# Patient Record
Sex: Female | Born: 1950 | Race: Black or African American | Hispanic: No | Smoking: Never smoker
Health system: Southern US, Community
[De-identification: ages and names within clinical notes are randomized; demographics above are authoritative.]

## PROBLEM LIST (undated history)

## (undated) DIAGNOSIS — E785 Hyperlipidemia, unspecified: Secondary | ICD-10-CM

## (undated) DIAGNOSIS — Z87440 Personal history of urinary (tract) infections: Secondary | ICD-10-CM

## (undated) DIAGNOSIS — Z8619 Personal history of other infectious and parasitic diseases: Secondary | ICD-10-CM

## (undated) DIAGNOSIS — A8030 Acute paralytic poliomyelitis, unspecified: Secondary | ICD-10-CM

## (undated) DIAGNOSIS — I10 Essential (primary) hypertension: Secondary | ICD-10-CM

## (undated) DIAGNOSIS — N950 Postmenopausal bleeding: Secondary | ICD-10-CM

## (undated) DIAGNOSIS — F329 Major depressive disorder, single episode, unspecified: Secondary | ICD-10-CM

## (undated) DIAGNOSIS — Z9889 Other specified postprocedural states: Secondary | ICD-10-CM

## (undated) DIAGNOSIS — A809 Acute poliomyelitis, unspecified: Secondary | ICD-10-CM

## (undated) DIAGNOSIS — H18519 Endothelial corneal dystrophy, unspecified eye: Secondary | ICD-10-CM

## (undated) DIAGNOSIS — H1851 Endothelial corneal dystrophy: Secondary | ICD-10-CM

## (undated) DIAGNOSIS — E119 Type 2 diabetes mellitus without complications: Secondary | ICD-10-CM

## (undated) HISTORY — PX: TUBAL LIGATION: SHX77

## (undated) HISTORY — PX: COLONOSCOPY: SHX174

## (undated) HISTORY — DX: Personal history of other infectious and parasitic diseases: Z86.19

## (undated) HISTORY — DX: Personal history of urinary (tract) infections: Z87.440

## (undated) HISTORY — DX: Other specified postprocedural states: Z98.890

## (undated) HISTORY — DX: Essential (primary) hypertension: I10

## (undated) HISTORY — PX: BUNIONECTOMY WITH HAMMERTOE RECONSTRUCTION: SHX5600

## (undated) HISTORY — DX: Acute poliomyelitis, unspecified: A80.9

## (undated) HISTORY — PX: CATARACT EXTRACTION W/ INTRAOCULAR LENS  IMPLANT, BILATERAL: SHX1307

## (undated) HISTORY — DX: Acute paralytic poliomyelitis, unspecified: A80.30

## (undated) HISTORY — DX: Hyperlipidemia, unspecified: E78.5

---

## 2000-01-15 ENCOUNTER — Encounter: Payer: Self-pay | Admitting: Emergency Medicine

## 2000-01-15 ENCOUNTER — Emergency Department (HOSPITAL_COMMUNITY): Admission: EM | Admit: 2000-01-15 | Discharge: 2000-01-15 | Payer: Self-pay | Admitting: Emergency Medicine

## 2000-05-26 ENCOUNTER — Inpatient Hospital Stay (HOSPITAL_COMMUNITY): Admission: EM | Admit: 2000-05-26 | Discharge: 2000-05-31 | Payer: Self-pay | Admitting: *Deleted

## 2000-06-02 ENCOUNTER — Other Ambulatory Visit (HOSPITAL_COMMUNITY): Admission: RE | Admit: 2000-06-02 | Discharge: 2000-06-13 | Payer: Self-pay | Admitting: Psychiatry

## 2004-04-10 ENCOUNTER — Ambulatory Visit: Payer: Self-pay | Admitting: Internal Medicine

## 2004-04-23 ENCOUNTER — Ambulatory Visit: Payer: Self-pay | Admitting: Internal Medicine

## 2004-06-18 ENCOUNTER — Other Ambulatory Visit: Admission: RE | Admit: 2004-06-18 | Discharge: 2004-06-18 | Payer: Self-pay | Admitting: Internal Medicine

## 2004-06-18 ENCOUNTER — Ambulatory Visit: Payer: Self-pay | Admitting: Internal Medicine

## 2005-01-03 ENCOUNTER — Ambulatory Visit: Payer: Self-pay | Admitting: Internal Medicine

## 2005-01-11 ENCOUNTER — Ambulatory Visit: Payer: Self-pay | Admitting: Internal Medicine

## 2005-01-14 HISTORY — PX: BREAST BIOPSY: SHX20

## 2005-01-25 ENCOUNTER — Encounter: Admission: RE | Admit: 2005-01-25 | Discharge: 2005-01-25 | Payer: Self-pay | Admitting: Internal Medicine

## 2005-02-01 ENCOUNTER — Ambulatory Visit: Payer: Self-pay | Admitting: Internal Medicine

## 2005-02-18 ENCOUNTER — Encounter: Admission: RE | Admit: 2005-02-18 | Discharge: 2005-02-18 | Payer: Self-pay | Admitting: Internal Medicine

## 2005-02-20 ENCOUNTER — Encounter: Admission: RE | Admit: 2005-02-20 | Discharge: 2005-02-20 | Payer: Self-pay | Admitting: Internal Medicine

## 2005-02-20 ENCOUNTER — Encounter (INDEPENDENT_AMBULATORY_CARE_PROVIDER_SITE_OTHER): Payer: Self-pay | Admitting: Specialist

## 2005-09-24 ENCOUNTER — Encounter: Admission: RE | Admit: 2005-09-24 | Discharge: 2005-09-24 | Payer: Self-pay | Admitting: Internal Medicine

## 2006-02-06 ENCOUNTER — Encounter: Admission: RE | Admit: 2006-02-06 | Discharge: 2006-02-06 | Payer: Self-pay | Admitting: Internal Medicine

## 2006-06-02 ENCOUNTER — Ambulatory Visit: Payer: Self-pay | Admitting: Internal Medicine

## 2006-06-19 ENCOUNTER — Ambulatory Visit: Payer: Self-pay | Admitting: Internal Medicine

## 2006-07-03 ENCOUNTER — Ambulatory Visit: Payer: Self-pay | Admitting: Internal Medicine

## 2007-03-10 ENCOUNTER — Encounter: Payer: Self-pay | Admitting: *Deleted

## 2007-03-10 DIAGNOSIS — Z87898 Personal history of other specified conditions: Secondary | ICD-10-CM

## 2007-03-10 DIAGNOSIS — F329 Major depressive disorder, single episode, unspecified: Secondary | ICD-10-CM

## 2007-03-10 DIAGNOSIS — A809 Acute poliomyelitis, unspecified: Secondary | ICD-10-CM | POA: Insufficient documentation

## 2007-03-10 DIAGNOSIS — E785 Hyperlipidemia, unspecified: Secondary | ICD-10-CM

## 2007-03-10 DIAGNOSIS — I10 Essential (primary) hypertension: Secondary | ICD-10-CM

## 2007-03-10 DIAGNOSIS — B009 Herpesviral infection, unspecified: Secondary | ICD-10-CM | POA: Insufficient documentation

## 2007-03-10 DIAGNOSIS — Z87448 Personal history of other diseases of urinary system: Secondary | ICD-10-CM | POA: Insufficient documentation

## 2007-11-09 ENCOUNTER — Telehealth: Payer: Self-pay | Admitting: Internal Medicine

## 2007-11-25 ENCOUNTER — Other Ambulatory Visit: Admission: RE | Admit: 2007-11-25 | Discharge: 2007-11-25 | Payer: Self-pay | Admitting: Internal Medicine

## 2007-11-25 ENCOUNTER — Encounter: Admission: RE | Admit: 2007-11-25 | Discharge: 2007-11-25 | Payer: Self-pay | Admitting: Internal Medicine

## 2007-11-25 ENCOUNTER — Encounter: Payer: Self-pay | Admitting: Internal Medicine

## 2007-11-25 ENCOUNTER — Ambulatory Visit: Payer: Self-pay | Admitting: Internal Medicine

## 2007-11-25 LAB — CONVERTED CEMR LAB
ALT: 19 units/L (ref 0–35)
AST: 27 units/L (ref 0–37)
Albumin: 3.9 g/dL (ref 3.5–5.2)
Alkaline Phosphatase: 110 units/L (ref 39–117)
CO2: 30 meq/L (ref 19–32)
Chloride: 102 meq/L (ref 96–112)
Cholesterol: 139 mg/dL (ref 0–200)
LDL Cholesterol: 77 mg/dL (ref 0–99)
Potassium: 3 meq/L — ABNORMAL LOW (ref 3.5–5.1)
TSH: 0.73 microintl units/mL (ref 0.35–5.50)
Total Bilirubin: 0.7 mg/dL (ref 0.3–1.2)
Total CHOL/HDL Ratio: 3.5
Total Protein: 7.6 g/dL (ref 6.0–8.3)
VLDL: 21 mg/dL (ref 0–40)

## 2007-11-29 ENCOUNTER — Encounter: Payer: Self-pay | Admitting: Internal Medicine

## 2008-09-22 ENCOUNTER — Telehealth (INDEPENDENT_AMBULATORY_CARE_PROVIDER_SITE_OTHER): Payer: Self-pay | Admitting: *Deleted

## 2008-11-15 ENCOUNTER — Ambulatory Visit: Payer: Self-pay | Admitting: Internal Medicine

## 2008-11-15 LAB — CONVERTED CEMR LAB
Albumin: 4.1 g/dL (ref 3.5–5.2)
Basophils Relative: 0.3 % (ref 0.0–3.0)
Cholesterol: 120 mg/dL (ref 0–200)
Creatinine, Ser: 0.7 mg/dL (ref 0.4–1.2)
Eosinophils Absolute: 0.2 10*3/uL (ref 0.0–0.7)
Hemoglobin: 13.7 g/dL (ref 12.0–15.0)
Ketones, ur: NEGATIVE mg/dL
Leukocytes, UA: NEGATIVE
Lymphs Abs: 1.7 10*3/uL (ref 0.7–4.0)
MCHC: 33.7 g/dL (ref 30.0–36.0)
MCV: 87.5 fL (ref 78.0–100.0)
Monocytes Absolute: 0.4 10*3/uL (ref 0.1–1.0)
Neutro Abs: 4.1 10*3/uL (ref 1.4–7.7)
Potassium: 3.1 meq/L — ABNORMAL LOW (ref 3.5–5.1)
RBC: 4.65 M/uL (ref 3.87–5.11)
RDW: 13.6 % (ref 11.5–14.6)
Specific Gravity, Urine: 1.015 (ref 1.000–1.030)
TSH: 1.17 microintl units/mL (ref 0.35–5.50)
Total CHOL/HDL Ratio: 3
Total Protein, Urine: NEGATIVE mg/dL
Total Protein: 7.3 g/dL (ref 6.0–8.3)
Triglycerides: 140 mg/dL (ref 0.0–149.0)
Urine Glucose: NEGATIVE mg/dL
pH: 6 (ref 5.0–8.0)

## 2008-11-25 ENCOUNTER — Ambulatory Visit: Payer: Self-pay | Admitting: Internal Medicine

## 2008-11-25 ENCOUNTER — Encounter: Admission: RE | Admit: 2008-11-25 | Discharge: 2008-11-25 | Payer: Self-pay | Admitting: Internal Medicine

## 2008-11-25 DIAGNOSIS — E118 Type 2 diabetes mellitus with unspecified complications: Secondary | ICD-10-CM | POA: Insufficient documentation

## 2008-11-25 DIAGNOSIS — E119 Type 2 diabetes mellitus without complications: Secondary | ICD-10-CM

## 2008-11-30 ENCOUNTER — Ambulatory Visit: Payer: Self-pay | Admitting: Internal Medicine

## 2008-12-15 ENCOUNTER — Telehealth: Payer: Self-pay | Admitting: Internal Medicine

## 2008-12-29 ENCOUNTER — Encounter: Payer: Self-pay | Admitting: Internal Medicine

## 2009-03-17 ENCOUNTER — Ambulatory Visit: Payer: Self-pay | Admitting: Internal Medicine

## 2009-03-17 LAB — CONVERTED CEMR LAB: Hgb A1c MFr Bld: 6.7 % — ABNORMAL HIGH (ref 4.6–6.5)

## 2009-03-28 ENCOUNTER — Telehealth: Payer: Self-pay | Admitting: Internal Medicine

## 2009-11-27 ENCOUNTER — Ambulatory Visit: Payer: Self-pay | Admitting: Internal Medicine

## 2009-11-27 LAB — CONVERTED CEMR LAB
BUN: 11 mg/dL (ref 6–23)
Basophils Absolute: 0 10*3/uL (ref 0.0–0.1)
Cholesterol: 132 mg/dL (ref 0–200)
Eosinophils Absolute: 0.1 10*3/uL (ref 0.0–0.7)
GFR calc non Af Amer: 91.27 mL/min (ref >60)
Glucose, Bld: 110 mg/dL — ABNORMAL HIGH (ref 70–99)
HCT: 43.3 % (ref 36.0–46.0)
Leukocytes, UA: NEGATIVE
Lymphs Abs: 2 10*3/uL (ref 0.7–4.0)
MCV: 86.8 fL (ref 78.0–100.0)
Monocytes Absolute: 0.4 10*3/uL (ref 0.1–1.0)
Monocytes Relative: 4.8 % (ref 3.0–12.0)
Nitrite: NEGATIVE
Platelets: 300 10*3/uL (ref 150.0–400.0)
Potassium: 3.3 meq/L — ABNORMAL LOW (ref 3.5–5.1)
RDW: 14.3 % (ref 11.5–14.6)
Specific Gravity, Urine: 1.01 (ref 1.000–1.030)
TSH: 2.35 microintl units/mL (ref 0.35–5.50)
Total Bilirubin: 0.6 mg/dL (ref 0.3–1.2)
VLDL: 10 mg/dL (ref 0.0–40.0)
pH: 5.5 (ref 5.0–8.0)

## 2009-12-01 ENCOUNTER — Encounter: Payer: Self-pay | Admitting: Internal Medicine

## 2009-12-01 ENCOUNTER — Encounter: Admission: RE | Admit: 2009-12-01 | Discharge: 2009-12-01 | Payer: Self-pay | Admitting: Internal Medicine

## 2009-12-01 ENCOUNTER — Ambulatory Visit: Payer: Self-pay | Admitting: Internal Medicine

## 2009-12-01 ENCOUNTER — Other Ambulatory Visit: Admission: RE | Admit: 2009-12-01 | Discharge: 2009-12-01 | Payer: Self-pay | Admitting: Internal Medicine

## 2009-12-01 LAB — CONVERTED CEMR LAB
HDL goal, serum: 40 mg/dL
LDL Goal: 100 mg/dL

## 2009-12-09 ENCOUNTER — Encounter: Payer: Self-pay | Admitting: Internal Medicine

## 2010-02-04 ENCOUNTER — Encounter: Payer: Self-pay | Admitting: Internal Medicine

## 2010-02-13 NOTE — Assessment & Plan Note (Signed)
Summary: PHYSICAL  STC   Vital Signs:  Patient profile:   60 year old female Height:      66 inches Weight:      177 pounds BMI:     28.67 O2 Sat:      97 % on Room air Temp:     98.3 degrees F oral Pulse rate:   63 / minute BP sitting:   124 / 82  (left arm) Cuff size:   large  Vitals Entered By: Bill Salinas CMA (December 01, 2009 1:53 PM)  O2 Flow:  Room air CC: cpx, Hypertension Management, Lipid Management Comments pt states she is no longer taking pristiq   Primary Care Provider:  Jacques Navy MD  CC:  cpx, Hypertension Management, and Lipid Management.  History of Present Illness: Patient presents for an annual well woman exam. She is feeling well and has no complaints. She is worried about breast cancer: mother and older sister have both had breast cancer and are BRCA positive. She has not been tested.  Younger sister also had breast but was BRCA negative.  Hypertension History:      She denies headache, chest pain, palpitations, dyspnea with exertion, orthopnea, PND, neurologic problems, and side effects from treatment.  She notes no problems with any antihypertensive medication side effects.        Positive major cardiovascular risk factors include female age 99 years old or older, diabetes, hyperlipidemia, and hypertension.  Negative major cardiovascular risk factors include negative family history for ischemic heart disease and non-tobacco-user status.        Further assessment for target organ damage reveals no history of ASHD, stroke/TIA, or peripheral vascular disease.    Lipid Management History:      Positive NCEP/ATP III risk factors include female age 69 years old or older, diabetes, and hypertension.  Negative NCEP/ATP III risk factors include no family history for ischemic heart disease, non-tobacco-user status, no ASHD (atherosclerotic heart disease), no prior stroke/TIA, no peripheral vascular disease, and no history of aortic aneurysm.      Current  Medications (verified): 1)  Norvasc 10 Mg  Tabs (Amlodipine Besylate) .... Take One Tablet Once Daily 2)  Ferrous Sulfate 325 (65 Fe) Mg  Tabs (Ferrous Sulfate) .... Take 1 Tablet By Mouth Once A Day 3)  Hydrochlorothiazide 12.5 Mg  Tabs (Hydrochlorothiazide) .... Take One Tablet Once Daily 4)  Vytorin 10-20 Mg  Tabs (Ezetimibe-Simvastatin) .... Take One Tablet At Bedtime 5)  Klor-Con 10 10 Meq Cr-Tabs (Potassium Chloride) .... Take 1 Tablet By Mouth Once A Day 6)  Vitamin C Cr 500 Mg Cr-Caps (Ascorbic Acid) .Marland Kitchen.. 1 Cap Daily 7)  Flax Seed Oil 1000 Mg Caps (Flaxseed (Linseed)) .Marland Kitchen.. 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 One is BRCA +  Physical Exam  General:  alert, well-developed, well-nourished, well-hydrated, and healthy-appearing AA woman.   Head:  Normocephalic and atraumatic without obvious abnormalities. No apparent alopecia or balding. Eyes:  No corneal or conjunctival inflammation noted. EOMI. Perrla. Funduscopic exam benign, without hemorrhages, exudates or papilledema. Vision grossly normal. Ears:   External ear exam shows no significant lesions or deformities.  Otoscopic examination reveals clear canals, tympanic membranes are intact bilaterally without bulging, retraction, inflammation or discharge. Hearing is grossly normal bilaterally. Nose:  no external deformity and no external erythema.   Mouth:  Oral mucosa and oropharynx without lesions or exudates.  Teeth in good repair. Neck:  supple, full ROM, no thyromegaly, and no HJR.   Chest Wall:  No deformities, masses, or tenderness noted. Breasts:  No mass, nodules, thickening, tenderness, bulging, retraction, inflamation, nipple discharge or skin changes noted.   Lungs:  Normal respiratory effort, chest expands symmetrically. Lungs are clear to auscultation, no crackles or wheezes. Heart:  Normal rate and regular rhythm. S1 and S2 normal without gallop, murmur, click, rub or other extra sounds. Abdomen:  soft, non-tender, normal bowel sounds, no masses, no guarding, and no hepatomegaly.   Genitalia:  Pelvic Exam:        External: normal female genitalia without lesions or masses        Vagina: normal without lesions or masses        Cervix: normal without lesions or masses        Adnexa: normal bimanual exam without masses or fullness        Uterus: normal by palpation        Pap smear: performed Msk:  normal ROM, no joint tenderness, no joint swelling, no joint warmth, and no redness over joints.   Pulses:  2+ radial, 1+ DP pulses Extremities:  No clubbing, cyanosis, edema, or deformity noted with normal full range of motion of all joints.   Neurologic:  alert & oriented X3, cranial nerves II-XII intact, strength normal in all extremities, sensation intact to light touch, sensation intact to pinprick, gait normal, and DTRs symmetrical and normal.   Skin:  turgor normal, color normal, no rashes, and no suspicious lesions.   Cervical Nodes:  No lymphadenopathy noted Axillary Nodes:  no R axillary adenopathy and no L axillary adenopathy.    Psych:  Oriented X3, memory intact for recent and remote, normally interactive, good eye contact, and not anxious appearing.     Impression & Recommendations:  Problem # 1:  DIABETES MELLITUS, TYPE II, CONTROLLED (ICD-250.00)  Labs Reviewed: Creat: 0.8 (11/27/2009)    Reviewed HgBA1c results: 6.7 (03/17/2009)  7.1 (11/25/2008)  Serum glucose is good. No A1C done with CPX labs.  Plan - continue life-style management   Problem # 2:  HYPERLIPIDEMIA (ICD-272.4)  Her updated medication list for this problem includes:    Vytorin 10-20 Mg Tabs (Ezetimibe-simvastatin) .Marland Kitchen... Take one tablet at bedtime  Labs Reviewed: SGOT: 30 (11/27/2009)   SGPT: 27 (11/27/2009)   HDL:55.80 (11/27/2009), 40.50 (11/15/2008)  LDL:66 (11/27/2009), 52 (11/15/2008)  Chol:132 (11/27/2009), 120 (11/15/2008)  Trig:50.0 (11/27/2009), 140.0 (11/15/2008)  Excellent control on current medications.  Problem # 3:  HYPERTENSION (ICD-401.9)  Her updated medication list for this problem includes:    Norvasc 10 Mg Tabs (Amlodipine besylate) .Marland Kitchen... Take one tablet once daily    Hydrochlorothiazide 12.5 Mg Tabs (Hydrochlorothiazide) .Marland Kitchen... Take one tablet once daily  BP today:  124/82 Prior BP: 124/80 (11/30/2008)  Labs Reviewed: K+: 3.3 (11/27/2009) Creat: : 0.8 (11/27/2009)   Very well controlled on present meds.   Orders: EKG w/ Interpretation (93000)  Problem # 4:  Preventive Health Care (ICD-V70.0) Unremarkable interval history. Physical exam is normal. lab results are excellent and in normal range. Breast health - normal manual exam, she hassuthad mammogram. Her family history is remarkable for mother and 2 sisters with breast cancer and mother and 1 sister BRCA +. She is encouraged to have BRCA testing for her own sake and that of her daughter. She is a candidate for colonsocopy. She is a candidate for up-dating immuniaztions: tetnus, pneumonia and shingles. 12 lead EKG without evidence of injury or  ischemia.  In summary - a very nice woman who appears to be medically stable. Counseled as to the issues above. She will return in 6 months for an interval follow-up.  Complete Medication List: 1)  Norvasc 10 Mg Tabs (Amlodipine besylate) .... Take one tablet once daily 2)  Ferrous Sulfate 325 (65 Fe) Mg Tabs (Ferrous sulfate) .... Take 1 tablet by mouth once a day 3)  Hydrochlorothiazide 12.5 Mg Tabs (Hydrochlorothiazide) .... Take one tablet once daily 4)  Vytorin 10-20 Mg Tabs (Ezetimibe-simvastatin) .... Take one tablet at bedtime 5)  Klor-con 10 10 Meq Cr-tabs (Potassium chloride) .... Take 1 tablet by mouth once a day 6)  Vitamin C Cr 500 Mg Cr-caps (Ascorbic acid) .Marland Kitchen.. 1 cap daily 7)  Flax Seed Oil 1000 Mg Caps (Flaxseed (linseed)) .Marland Kitchen.. 1 tab daily  Hypertension Assessment/Plan:      The patient's hypertensive risk group is category C: Target organ damage and/or diabetes.  Her calculated 10 year risk of coronary heart disease is 13 %.  Today's blood pressure is 124/82.    Lipid Assessment/Plan:      Based on NCEP/ATP III, the patient's risk factor category is "history of diabetes".  The patient's lipid goals are as follows: Total cholesterol goal is 200; LDL cholesterol goal is 100; HDL cholesterol goal is 40; Triglyceride goal is 150.  Her LDL cholesterol goal has been met.    Patient: Anna Cooper Note: All result statuses are Final unless otherwise noted.  Tests: (1) Lipid Panel (LIPID)   Cholesterol               132 mg/dL                   1-761     ATP III Classification            Desirable:  < 200 mg/dL                    Borderline High:  200 - 239 mg/dL               High:  > = 240 mg/dL   Triglycerides             50.0 mg/dL                  6.0-737.1     Normal:  <150 mg/dL     Borderline High:  062 - 199 mg/dL   HDL                       69.48 mg/dL                 >54.62   VLDL Cholesterol  10.0 mg/dL                  7.4-25.9   LDL Cholesterol            66 mg/dL                    5-63  CHO/HDL Ratio:  CHD Risk                             2                    Men          Women     1/2 Average Risk     3.4          3.3     Average Risk          5.0          4.4     2X Average Risk          9.6          7.1     3X Average Risk          15.0          11.0                           Tests: (2) BMP (METABOL)   Sodium                    136 mEq/L                   135-145   Potassium            [L]  3.3 mEq/L                   3.5-5.1   Chloride                  99 mEq/L                    96-112   Carbon Dioxide            29 mEq/L                    19-32   Glucose              [H]  110 mg/dL                   87-56   BUN                       11 mg/dL                    4-33   Creatinine                0.8 mg/dL                   2.9-5.1   Calcium                   9.2 mg/dL                   8.8-41.6   GFR  91.27 mL/min                >60  Tests: (3) CBC Platelet w/Diff (CBCD)   White Cell Count          8.0 K/uL                    4.5-10.5   Red Cell Count            4.99 Mil/uL                 3.87-5.11   Hemoglobin                14.3 g/dL                   16.1-09.6   Hematocrit                43.3 %                      36.0-46.0   MCV                       86.8 fl                     78.0-100.0   MCHC                      33.1 g/dL                   04.5-40.9   RDW                       14.3 %                      11.5-14.6   Platelet Count            300.0 K/uL                  150.0-400.0   Neutrophil %              68.7 %                      43.0-77.0   Lymphocyte %              25.0 %                      12.0-46.0   Monocyte %                4.8 %                       3.0-12.0   Eosinophils%              1.2 %                       0.0-5.0   Basophils %               0.3 %                       0.0-3.0   Neutrophill Absolute      5.5 K/uL                    1.4-7.7   Lymphocyte Absolute  2.0  K/uL                    0.7-4.0   Monocyte Absolute         0.4 K/uL                    0.1-1.0  Eosinophils, Absolute                             0.1 K/uL                    0.0-0.7   Basophils Absolute        0.0 K/uL                    0.0-0.1  Tests: (4) Hepatic/Liver Function Panel (HEPATIC)   Total Bilirubin           0.6 mg/dL                   1.6-1.0   Direct Bilirubin          0.1 mg/dL                   9.6-0.4   Alkaline Phosphatase      114 U/L                     39-117   AST                       30 U/L                      0-37   ALT                       27 U/L                      0-35   Total Protein             8.2 g/dL                    5.4-0.9   Albumin                   4.6 g/dL                    8.1-1.9  Tests: (5) TSH (TSH)   FastTSH                   2.35 uIU/mL                 0.35-5.50  Tests: (6) UDip Only (UDIP)   Color                     LT. YELLOW       RANGE:  Yellow;Lt. Yellow   Clarity                   CLEAR                       Clear   Specific Gravity          1.010                       1.000 - 1.030  Urine Ph                  5.5                         5.0-8.0   Protein                   NEGATIVE                    Negative   Urine Glucose             NEGATIVE                    Negative   Ketones                   NEGATIVE                    Negative   Urine Bilirubin           NEGATIVE                    Negative   Blood                     NEGATIVE                    Negative   Urobilinogen              0.2                         0.0 - 1.0   Leukocyte Esterace        NEGATIVE                    Negative    Nitrite                   NEGATIVE                    Negative  Orders Added: 1)  Est. Patient 40-64 years [99396] 2)  Est. Patient Level III [30865] 3)  EKG w/ Interpretation [93000]

## 2010-02-13 NOTE — Progress Notes (Signed)
  Phone Note Outgoing Call   Reason for Call: Discuss lab or test results Summary of Call: please call. A1C 6.7%  GOOD WORK. Continue present treatment and life-style.  Thanks MEN Initial call taken by: Jacques Navy MD,  March 28, 2009 4:29 PM  Follow-up for Phone Call        informed pt  Follow-up by: Ami Bullins CMA,  March 28, 2009 4:40 PM

## 2010-02-13 NOTE — Letter (Signed)
Summary: [facility]  [facility]   Imported By: Sherian Rein 04/03/2009 08:42:38  _____________________________________________________________________  External Attachment:    Type:   Image     Comment:   External Document

## 2010-02-13 NOTE — Letter (Signed)
    [facility] [address] [place], [place]  [address] Phone: [phone]      December 10, 2009   MARIAMAWIT DEPAOLI [address] [place], [place] [address]  RE:  LAB RESULTS  Dear  Ms. Gabbard,  The following is an interpretation of your most recent lab tests.  Please take note of any instructions provided or changes to medications that have resulted from your lab work.  Pap Smear: normal     HAPPY HOLIDAYS   Sincerely Yours,    Jacques Navy MD
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
## 2010-06-01 NOTE — H&P (Signed)
[facility]  Patient:    Anna Cooper, Anna Cooper                    MRN: [id number] Adm. Date:  [id number] Attending:  Jasmine Pang Dictator:   Young Berry Lorin Picket, R.N., F.N.P.                   Psychiatric Admission Assessment  DATE OF ADMISSION:  May 26, 2000  PATIENT IDENTIFICATION:  This is a 60 year old married African-American female who is a voluntary admission for depression with suicidal ideation and plans to drive her car off a bridge.  HISTORY OF PRESENT ILLNESS:  The patient reports a history of depression since October 2001.  About that time, she was placed on an "action plan" at her job because "she was not enthusiastic enough", reports increased stress from this type of job supervisory pressure.  This work pressure led to her going on leave of absence in January for medical reasons, specifically depression, menopausal syndrome, and job stress.  She was placed on leave of absence by her physician in January for a six month period and this six month period expired May 1 at which time her physician advised her that she should consider going back to her job and giving it a try again.  She stated that she did not feel like she was ready to go back again, her family physician suggested that she see a psychiatrist.  She reports increased depression, intermittently suicidal since October, this time with thoughts that she might consider driving her car off of a bridge.  She denies any homicidal ideation, no auditory or visual hallucinations.  She does endorse poor appetite, decreased sleep with frequent awakenings.  She is able to promise safety on the unit.  PAST PSYCHIATRIC HISTORY:  None; no inpatient nor outpatient treatment.  She has been seen by Kellie Moor who is a Airline pilot with [employer] in [place].  SUBSTANCE ABUSE HISTORY:  The patient denies any abuse of ETOH or drugs.  She uses no tobacco.  PAST MEDICAL HISTORY:  Primary  care Sorrel Cassetta is Dr. Thea Silversmith in [place] currently; however she has an appointment to establish with Dr. Debby Bud at the [facility] with her first appointment next week.  Medical problems include hypertension and states she has been diagnosed with menopausal syndrome with hot flashes for which she was prescribed hormone replacement therapy which she is not taking.  Medications: Celexa 20 mg q.d. and Xanax 0.5 mg q.h.s. and b.i.d. p.r.n.  She has last used these medications more than two weeks ago; only current medication that she has been taking regularly is Norvasc 10 mg q.d. for her hypertension.  Drug allergies: None.  PE is pending; labs are pending.  SOCIAL HISTORY:  The patient has lived in [place] 25 years, married 31 years.  She has a 35 year old son and 39 year old daughter.  Describes her marriage as supportive and a good, solid marriage.  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  She denies any financial or legal problems.  Family history: Father with depression and ETOH abuse.  MENTAL STATUS EXAMINATION:  Casually dressed, neatly groomed, healthy in appearance African-American female.  Affect is full range.  Mildly histrionic in manner.  Speech is normal in pace and tone.  Mood is mildly depressed. Thought process is logical and coherent.  Positive suicidal ideation but she promises safety, no homicidal ideation, no auditory or  visual hallucinations. Oriented x 3, intact.  ADMISSION DIAGNOSES: Axis I:    Depression, not otherwise specified. Axis II:   Deferred. Axis III:  Hypertension. Axis IV:   Moderate problems related to work stress. Axis V:    Current 45, past year 64.  INITIAL PLAN OF CARE:  We will admit the patient to stabilize her mood.  We will please transfer her to the service of Dr. Lourdes Sledge.  We will change her to a low sodium diet due to her high blood pressure.  We will restart her  on her Celexa 20 mg q.d. and continue her Norvasc.  We will encourage counseling after discharge.  Goal is to alleviate her suicidal ideation.  ESTIMATED LENGTH OF STAY:  Two to three days. DD:  05/27/00 TD:  05/27/00 Job: [id number]

## 2010-06-01 NOTE — Discharge Summary (Signed)
[facility]  Patient:    Anna Cooper, Anna Cooper                    MRN: [id number] Adm. Date:  [id number] Disc. Date: [id number] Attending:  Carolanne Grumbling D                           Discharge Summary  INTRODUCTION:  Bexleigh Theriault is a 60 year old African-American female who was admitted on a voluntary basis for depression with suicidal plan to drive her car off a bridge.  She reports history of depression since October 2001. At that time, she reports increased pressure on the job, which resulted in depression and being off work since January.  She is supposed to go back to work on May 14, 2000 but did not feel like she was able to return to work secondary to depression.  She does not have history of substance abuse. Medically, patient suffers from hypertension.  The medication, at the time of admission, was Celexa and Xanax.  Details of admission situation were available in the chart.  HOSPITAL COURSE:  After admission to the ward, patient was placed on special observation.  Celexa initially 20 mg was introduced to 30 mg daily.  Because of insomnia and racing thoughts, I started patient on low dose of Risperdal 0.25 mg, which was increased to 0.5 mg at bedtime.  After a few days, I added Effexor since Celexa did not seem to be sufficiently helping.  Celexa was decreased back to 20 mg daily.  On the day of discharge, May 17, she felt better with depression, not tired, more energy, no more suicidal thoughts since night prior to discharge.  Still some middle insomnia.  Patient felt still somewhat overwhelmed with thoughts.  I decided to increase Risperdal to 0.75 mg at bedtime.  Because of anemia, iron studies were offered.  Patient had some elevation of blood pressure but not significant enough to stop treatment with Effexor.  On May 31, 2000, she did much better.  I talked to her husband.  He felt that she is appropriate for discharge.  Mental  status examination did not reveal dangerous ideation or psychosis.  Side effects of medications were explained and necessity of checking blood pressure while on Effexor was stressed.  MEDICAL PROBLEMS:  Vital signs throughout hospitalization were stable with slight elevation of blood pressure at one point, 140/90.  Upon discharge, blood pressure was 114/87.  Normal pulse, respiration rate and no temperature.  LABORATORY FINDINGS:  Review of additional lab work showed normal CBC with exception of slight decrease in hemoglobin, hematocrit and MCV.  Chemistry 17 was normal with exception of borderline albumin 3.2.  Liver function tests were normal.  Iron level and percent of saturation were below normal and ferratin was low.  Total iron-binding capacity was in middle range.  Thyroid function tests were normal.  DISCHARGE DIAGNOSES: Axis I:    Major depression, recurrent, moderate to severe. Axis II:   Deferred. Axis III:  Hypertension. Axis IV:   Moderate stressor (mostly occupational problems). Axis V:    Global Assessment of Functioning:  Upon admission 35; upon            discharge 60; maximum for past year 75.  DISCHARGE MEDICATIONS: 1. Celexa 20 mg q.d. x 3 days; then 10 mg q.d. x 2 days and then discontinue. 2. Effexor XR 75 mg b.i.d. x 7 days; then 150 mg q.a.m.  and 75 mg q.p.m. 3. Norvasc 10 mg q.d. 4. Risperdal 1 mg q.h.s.  DISCHARGE RECOMMENDATIONS:  Patient should not attend to work until evaluated on outpatient basis.  Patients husband will secure patients medication upon discharge.  She should often check blood pressure while on Effexor.  Patient was supposed to check with her family doctor for treatment of finding signs of iron deficiency.  She is supposed to enter the intensive program on Jun 02, 2000 at 8:45 a.m.  Patient was discharged in good condition in care of her family. DD:  07/16/00 TD:  07/17/00 Job: [id number]

## 2010-06-20 ENCOUNTER — Other Ambulatory Visit: Payer: Self-pay | Admitting: Internal Medicine

## 2010-09-20 ENCOUNTER — Other Ambulatory Visit: Payer: Self-pay | Admitting: Internal Medicine

## 2010-09-24 ENCOUNTER — Telehealth: Payer: Self-pay | Admitting: *Deleted

## 2010-09-24 DIAGNOSIS — Z0000 Encounter for general adult medical examination without abnormal findings: Secondary | ICD-10-CM

## 2010-09-24 NOTE — Telephone Encounter (Signed)
CPX labs  

## 2010-11-30 ENCOUNTER — Other Ambulatory Visit (INDEPENDENT_AMBULATORY_CARE_PROVIDER_SITE_OTHER): Payer: Self-pay

## 2010-11-30 ENCOUNTER — Other Ambulatory Visit: Payer: Self-pay | Admitting: Internal Medicine

## 2010-11-30 DIAGNOSIS — Z0000 Encounter for general adult medical examination without abnormal findings: Secondary | ICD-10-CM

## 2010-11-30 LAB — URINALYSIS, ROUTINE W REFLEX MICROSCOPIC
Total Protein, Urine: NEGATIVE
Urine Glucose: NEGATIVE
Urobilinogen, UA: 0.2 (ref 0.0–1.0)

## 2010-11-30 LAB — LIPID PANEL
Cholesterol: 115 mg/dL (ref 0–200)
HDL: 47.3 mg/dL
LDL Cholesterol: 50 mg/dL (ref 0–99)
Total CHOL/HDL Ratio: 2
Triglycerides: 88 mg/dL (ref 0.0–149.0)
VLDL: 17.6 mg/dL (ref 0.0–40.0)

## 2010-11-30 LAB — CBC WITH DIFFERENTIAL/PLATELET
Basophils Relative: 0.1 % (ref 0.0–3.0)
Eosinophils Absolute: 0 10*3/uL (ref 0.0–0.7)
Eosinophils Relative: 0.4 % (ref 0.0–5.0)
HCT: 41 % (ref 36.0–46.0)
Hemoglobin: 13.5 g/dL (ref 12.0–15.0)
Lymphs Abs: 0.9 10*3/uL (ref 0.7–4.0)
MCHC: 33 g/dL (ref 30.0–36.0)
MCV: 86.2 fl (ref 78.0–100.0)
Monocytes Absolute: 0.4 10*3/uL (ref 0.1–1.0)
Neutro Abs: 6.8 10*3/uL (ref 1.4–7.7)
RBC: 4.76 Mil/uL (ref 3.87–5.11)
WBC: 8.1 10*3/uL (ref 4.5–10.5)

## 2010-11-30 LAB — HEPATIC FUNCTION PANEL
ALT: 22 U/L (ref 0–35)
AST: 21 U/L (ref 0–37)
Albumin: 3.9 g/dL (ref 3.5–5.2)
Alkaline Phosphatase: 106 U/L (ref 39–117)
Bilirubin, Direct: 0.1 mg/dL (ref 0.0–0.3)
Total Bilirubin: 0.5 mg/dL (ref 0.3–1.2)
Total Protein: 7.5 g/dL (ref 6.0–8.3)

## 2010-11-30 LAB — TSH: TSH: 0.52 u[IU]/mL (ref 0.35–5.50)

## 2010-11-30 LAB — BASIC METABOLIC PANEL
CO2: 28 mEq/L (ref 19–32)
Chloride: 105 mEq/L (ref 96–112)
Creatinine, Ser: 1 mg/dL (ref 0.4–1.2)
Potassium: 3 mEq/L — ABNORMAL LOW (ref 3.5–5.1)

## 2010-12-05 ENCOUNTER — Ambulatory Visit (INDEPENDENT_AMBULATORY_CARE_PROVIDER_SITE_OTHER): Payer: BC Managed Care – PPO | Admitting: Internal Medicine

## 2010-12-05 ENCOUNTER — Encounter: Payer: Self-pay | Admitting: Internal Medicine

## 2010-12-05 ENCOUNTER — Ambulatory Visit (INDEPENDENT_AMBULATORY_CARE_PROVIDER_SITE_OTHER)
Admission: RE | Admit: 2010-12-05 | Discharge: 2010-12-05 | Disposition: A | Discharge status: Home / Self Care | Payer: BC Managed Care – PPO | Source: Ambulatory Visit | Attending: Internal Medicine | Admitting: Internal Medicine

## 2010-12-05 VITALS — BP 120/70 | HR 76 | Temp 98.7°F | Ht 64.5 in | Wt 171.0 lb

## 2010-12-05 DIAGNOSIS — R05 Cough: Secondary | ICD-10-CM

## 2010-12-05 DIAGNOSIS — R059 Cough, unspecified: Secondary | ICD-10-CM

## 2010-12-05 DIAGNOSIS — I10 Essential (primary) hypertension: Secondary | ICD-10-CM

## 2010-12-05 DIAGNOSIS — Z803 Family history of malignant neoplasm of breast: Secondary | ICD-10-CM

## 2010-12-05 DIAGNOSIS — Z1239 Encounter for other screening for malignant neoplasm of breast: Secondary | ICD-10-CM

## 2010-12-05 DIAGNOSIS — Z0000 Encounter for general adult medical examination without abnormal findings: Secondary | ICD-10-CM

## 2010-12-05 DIAGNOSIS — E785 Hyperlipidemia, unspecified: Secondary | ICD-10-CM

## 2010-12-05 DIAGNOSIS — E119 Type 2 diabetes mellitus without complications: Secondary | ICD-10-CM

## 2010-12-05 DIAGNOSIS — Z1231 Encounter for screening mammogram for malignant neoplasm of breast: Secondary | ICD-10-CM

## 2010-12-05 NOTE — Patient Instructions (Signed)
Cold - on exam there are decreased breath sounds at the right base - will get Chest x-ray today. For symptoms use a cough syrup with DM and guafenesin in it, i.e. Robitussin DM, take sudafed (generic) 30 mg twice a day for congestion, hydrate, Tylenol for aches or fever, Vitamin C 1500mg  daily. Call for persistent fever or shortness of breath.  Your exam is normal other than the cold  Breast cancer screening: will order an MR breast. You will hear from [place] once we get insurance approval. Your daughter may want to consider BRCA I & II screening.  Your lab reveal great cholesterol control, borderline blood sugar - try to cut down on sugar and carbs.

## 2010-12-05 NOTE — Progress Notes (Signed)
Subjective:    Patient ID: Anna Cooper, female    DOB: 11-Dec-1950, 60 y.o.   MRN: [id number]  HPI Ms. Misek presents for an annual exam. She had a pelvic and PAP last year that was normal. She is having ocassional sharp pain at the right lower quadrant toward the midline. In the interval since her last visit no major illness, no surgery. She did jam her left thumb while looking at mattresses- with resulting swelling and soreness for about a month.  Past Medical History  Diagnosis Date  . Depression   . Hyperlipidemia   . Infantile paralysis   . History of recurrent UTIs   . History of venereal warts   . Hypertension    Past Surgical History  Procedure Date  . Tubal ligation    Family History  Problem Relation Age of Onset  . Cancer Mother     Breast  . Heart attack Father   . Seizures Father   . Cancer Sister     Breast Cancer  . Cancer Paternal Uncle     Prostate Cancer  . Cancer Maternal Grandfather     Lung Cancer   History   Social History  . Marital Status: Married    Spouse Name: N/A    Number of Children: N/A  . Years of Education: N/A   Occupational History  . Not on file.   Social History Main Topics  . Smoking status: Not on file  . Smokeless tobacco: Not on file  . Alcohol Use:   . Drug Use:   . Sexually Active:    Other Topics Concern  . 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  HEENT:  Negative for hearing loss, ear pain, congestion, neck stiffness and postnasal drip. Negative for sore throat or swallowing problems. Negative for dental complaints.   Eyes: Negative for vision loss or change  in visual acuity.  Respiratory: Negative for chest tightness and wheezing. Negative for DOE.   Cardiovascular: Negative for chest pain or palpitations. No decreased exercise tolerance Gastrointestinal: No change in bowel habit. No bloating or gas. No reflux or indigestion Genitourinary: Negative for urgency, frequency, flank pain and difficulty urinating.  Musculoskeletal: Negative for myalgias, back pain, arthralgias and gait problem.  Neurological: Negative for dizziness, tremors, weakness and headaches.  Hematological: Negative for adenopathy.  Psychiatric/Behavioral: Negative for behavioral problems and dysphoric mood.       Objective:   Physical Exam Vitals reviewe - stable. Overweight.. Gen'l: well nourished, well developed AA woman in no distress HEENT - [place]/AT, EACs/TMs normal, oropharynx with native dentition in good condition, no buccal or palatal lesions, posterior pharynx clear, mucous membranes moist. C&S clear, PERRLA, fundi - normal Neck - supple, no thyromegaly Nodes- negative submental, cervical, supraclavicular regions Chest - no deformity, no CVAT Lungs - cleat without rales, wheezes. No increased work of breathing Breast - skin normal, nipples w/o discharge, no fixed mass or lesion, no axillary adenopathy Cardiovascular - regular rate and rhythm, quiet precordium, no murmurs, rubs or gallops, 2+ radial, DP and PT pulses Abdomen - BS+ x 4, no HSM, no guarding or rebound or tenderness Pelvic -  deferred to normal exam 2011 Rectal - deferred to GI Extremities - no clubbing, cyanosis, edema or deformity.  Neuro - A&O x 3, CN II-XII normal, motor strength normal and equal, DTRs 2+ and symmetrical biceps, radial, and patellar tendons. Cerebellar - no tremor, no rigidity, fluid movement and normal gait. Derm - Head, neck, back, abdomen and extremities without suspicious lesions  Lab Results  Component Value Date   WBC 8.1 11/30/2010   HGB 13.5 11/30/2010   HCT 41.0  11/30/2010   PLT 283.0 11/30/2010   GLUCOSE 161* 11/30/2010   CHOL 115 11/30/2010   TRIG 88.0 11/30/2010   HDL 47.30 11/30/2010   LDLCALC 50 11/30/2010   ALT 22 11/30/2010   AST 21 11/30/2010   NA 142 11/30/2010   K 3.0* 11/30/2010   CL 105 11/30/2010   CREATININE 1.0 11/30/2010   BUN 13 11/30/2010   CO2 28 11/30/2010   TSH 0.52 11/30/2010   HGBA1C 6.7* 03/17/2009           Assessment & Plan:

## 2010-12-08 DIAGNOSIS — Z0000 Encounter for general adult medical examination without abnormal findings: Secondary | ICD-10-CM | POA: Insufficient documentation

## 2010-12-08 MED ORDER — POTASSIUM CHLORIDE 20 MEQ PO PACK: PACK | ORAL | Status: DC

## 2010-12-08 NOTE — Assessment & Plan Note (Signed)
Recent li;pid panel with LD  Better than goal of 100 or less, HDL at 47.3 is OK. No adverse effects from medication.  Plan - continue present dose of vytorin and low fat diet.

## 2010-12-08 NOTE — Assessment & Plan Note (Signed)
Serum glucose is mildly elevated. Last A1C 2 years ago was better than goal at 6.7%  Plan - continue life-style modification - no sugar diet and low carbs.           A1c at next lab draw.

## 2010-12-08 NOTE — Assessment & Plan Note (Signed)
BP Readings from Last 3 Encounters:  12/05/10 120/70  12/01/09 124/82  11/30/08 124/80   Good control of BP. Potassium is low at 3.0  Plan - continue present regimen.           Start potassium replacement - Rx done for K+ 20 meq to take 2 tabs bid x 2 days then once daily.           F/ulab in 10 days

## 2010-12-08 NOTE — Assessment & Plan Note (Addendum)
Interval medical history unremarkable. She has a very positive family h/o for breast cancer with several 1st & second degree relatives with Breast cancer and BRCA gene positive. She has had annual mammograms. She has not had BRCA screening nor does she want to. Reviewed with radiologist at the [facility] who agrees that she meets criteria for Breast MRI. She is advised to let her daughter know of the family risk and encourage early screening. PCCs notified to schedule appointment. No record found in EMR of colonoscopy - will pull paper chart to look for study prior to 2008 and then schedule appropriate follow-up. Lab results are in normal range except for serum glucose at 161. No recent A1C. The serum glucose may not have been fasting. She is advised to follow a no or low sugar, low card diet. Will check A1C at next blood draw.   In summary- a very nice woman who is medically stable. She is advised to work on [employer] management: smart food choices, portion size control and regular aerobic exercise. She will bre scheduled for MRI breast. Depending on last colon study she will be referred for colonoscopy  If appropriate.

## 2010-12-10 ENCOUNTER — Other Ambulatory Visit: Payer: Self-pay | Admitting: Internal Medicine

## 2010-12-13 ENCOUNTER — Other Ambulatory Visit: Payer: Self-pay | Admitting: Internal Medicine

## 2010-12-13 DIAGNOSIS — Z1231 Encounter for screening mammogram for malignant neoplasm of breast: Secondary | ICD-10-CM

## 2010-12-13 DIAGNOSIS — Z1239 Encounter for other screening for malignant neoplasm of breast: Secondary | ICD-10-CM

## 2010-12-13 DIAGNOSIS — Z803 Family history of malignant neoplasm of breast: Secondary | ICD-10-CM

## 2010-12-24 ENCOUNTER — Ambulatory Visit
Admission: RE | Admit: 2010-12-24 | Discharge: 2010-12-24 | Disposition: A | Discharge status: Home / Self Care | Payer: BC Managed Care – PPO | Source: Ambulatory Visit | Attending: Internal Medicine | Admitting: Internal Medicine

## 2010-12-24 DIAGNOSIS — Z1231 Encounter for screening mammogram for malignant neoplasm of breast: Secondary | ICD-10-CM

## 2010-12-25 ENCOUNTER — Ambulatory Visit
Admission: RE | Admit: 2010-12-25 | Discharge: 2010-12-25 | Disposition: A | Discharge status: Home / Self Care | Payer: BC Managed Care – PPO | Source: Ambulatory Visit | Attending: Internal Medicine | Admitting: Internal Medicine

## 2010-12-25 DIAGNOSIS — Z803 Family history of malignant neoplasm of breast: Secondary | ICD-10-CM

## 2010-12-25 DIAGNOSIS — Z1239 Encounter for other screening for malignant neoplasm of breast: Secondary | ICD-10-CM

## 2010-12-25 MED ORDER — GADOBENATE DIMEGLUMINE 529 MG/ML IV SOLN
16.0000 mL | Freq: Once | INTRAVENOUS | Status: AC | PRN
  Administered 2010-12-25: 16 mL via INTRAVENOUS

## 2010-12-26 ENCOUNTER — Other Ambulatory Visit: Payer: Self-pay | Admitting: Internal Medicine

## 2010-12-26 DIAGNOSIS — R928 Other abnormal and inconclusive findings on diagnostic imaging of breast: Secondary | ICD-10-CM

## 2010-12-28 ENCOUNTER — Encounter: Payer: Self-pay | Admitting: Endocrinology

## 2010-12-28 ENCOUNTER — Ambulatory Visit (INDEPENDENT_AMBULATORY_CARE_PROVIDER_SITE_OTHER): Payer: BC Managed Care – PPO | Admitting: Endocrinology

## 2010-12-28 DIAGNOSIS — I10 Essential (primary) hypertension: Secondary | ICD-10-CM

## 2010-12-28 MED ORDER — PROMETHAZINE-CODEINE 6.25-10 MG/5ML PO SYRP: 5.0000 mL | ORAL_SOLUTION | ORAL | Status: AC | PRN

## 2010-12-28 MED ORDER — AZITHROMYCIN 500 MG PO TABS: 500.0000 mg | ORAL_TABLET | Freq: Every day | ORAL | Status: AC

## 2010-12-28 NOTE — Patient Instructions (Addendum)
You should skip amlodipine, hctz, and potassium for the next 3 days, then resume if you feel better.   Here are 2 prescriptions:  Antibiotic and cough syrup.   Loratadine-d (non-prescription) will help your congestion. I hope you feel better soon.  If you don't feel better by next week, please call dr Debby Bud.

## 2010-12-28 NOTE — Progress Notes (Signed)
Subjective:    Patient ID: Anna Cooper, female    DOB: Aug 22, 1950, 60 y.o.   MRN: [id number]  HPI Pt states few days of moderate myalgias throughout the body, and assoc sore throat and dry cough and nasal congestion Past Medical History  Diagnosis Date  . Depression   . Hyperlipidemia   . Infantile paralysis   . History of recurrent UTIs   . History of venereal warts   . Hypertension     Past Surgical History  Procedure Date  . Tubal ligation     History   Social History  . Marital Status: Married    Spouse Name: N/A    Number of Children: 2  . Years of Education: 20   Occupational History  . educator    Social History Main Topics  . Smoking status: Never Smoker   . Smokeless tobacco: Never Used  . Alcohol Use: No  . Drug Use: No  . Sexually Active: Not Currently   Other Topics Concern  . Not on file   Social History Narrative   HSG, College Grad, [place], [employer]; [employer]; [employer]- education. Married '71-seperated-'06.1 son '71, 1 daughter-'78; 2 grandchildren. 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 Daughter had a baby Jun 11, 2008, @nd  Aug 12th,'11. Lives in [place]. Not sexually active    Current Outpatient Prescriptions on File Prior to Visit  Medication Sig Dispense Refill  . amLODipine (NORVASC) 10 MG tablet TAKE ONE TABLET BY MOUTH EVERY DAY  90 tablet  3  . ferrous sulfate 325 (65 FE) MG tablet Take 325 mg by mouth daily with breakfast.        . Flaxseed, Linseed, (FLAX SEED OIL) 1000 MG CAPS Take by mouth.        . hydrochlorothiazide (,MICROZIDE/HYDRODIURIL,) 12.5 MG capsule TAKE ONE CAPSULE BY MOUTH EVERY DAY  90 capsule  3  . KLOR-CON 10 10 MEQ CR tablet TAKE ONE TABLET BY MOUTH EVERY DAY  90 each  3  . potassium chloride (KLOR-CON) 20 MEQ packet Take 1 tablet twice a day for three days then once a day routinely  30 tablet  11  . VYTORIN 10-20 MG per tablet TAKE ONE TABLET BY MOUTH AT BEDTIME  90 each  3      No Known Allergies  Family History  Problem Relation Age of Onset  . Cancer Mother     Breast, was in remission but had relapse with mets.  . Diabetes Mother   . Heart attack Father   . Seizures Father   . Heart disease Father     CAD/MI-fatal  . Cancer Sister     Breast Cancer  . Cancer Paternal Uncle     Prostate Cancer  . Cancer Maternal Grandfather     Lung Cancer  . Cancer Maternal Aunt     breast  . Cancer Sister     breast, BRCA +    BP 96/68  Pulse 105  Temp(Src) 101.2 F (38.4 C) (Oral)  SpO2 98%  Review of Systems She has fever, but no n/v or earache.      Objective:   Physical Exam VITAL SIGNS:  See vs page GENERAL: no distress head: no deformity eyes: no periorbital swelling, no proptosis.   external nose and ears are normal. mouth: no lesion seen.   Both tm's are red Neck: supple.   LUNGS:  Clear to auscultation     Assessment & Plan:  URI, new Htn, overcontrolled, prob due to URI

## 2011-01-01 ENCOUNTER — Telehealth: Payer: Self-pay | Admitting: *Deleted

## 2011-01-01 NOTE — Telephone Encounter (Signed)
If she is feeling better OK to resume medications: amlodipine and HCTZ. Resume potassium Friday.

## 2011-01-01 NOTE — Telephone Encounter (Signed)
Patient came in to office today after being seen by Dr Everardo All on Fri 12.14.12 for flu-like Sxs; pt's BP was 96/68 and was having pain & "heaviness feeling in legs"; Pt was instructed to to Hold Amlodipine, HCTZ, & potassium for 3 days, resume if feeling better. Pt was concerned about being off of meds and wanted to know if she needed OV [not having any problem w/legs]--checked BP 102/70 and told patient that we would contact her after response from MD concerning resuming meds and/or OV.

## 2011-01-02 NOTE — Telephone Encounter (Signed)
LMOM to inform patient. 

## 2011-01-10 ENCOUNTER — Other Ambulatory Visit: Payer: Self-pay | Admitting: Internal Medicine

## 2011-01-10 ENCOUNTER — Ambulatory Visit
Admission: RE | Admit: 2011-01-10 | Discharge: 2011-01-10 | Disposition: A | Discharge status: Home / Self Care | Payer: BC Managed Care – PPO | Source: Ambulatory Visit | Attending: Internal Medicine | Admitting: Internal Medicine

## 2011-01-10 DIAGNOSIS — N6009 Solitary cyst of unspecified breast: Secondary | ICD-10-CM

## 2011-01-10 DIAGNOSIS — R928 Other abnormal and inconclusive findings on diagnostic imaging of breast: Secondary | ICD-10-CM

## 2011-01-17 ENCOUNTER — Ambulatory Visit
Admission: RE | Admit: 2011-01-17 | Discharge: 2011-01-17 | Disposition: A | Discharge status: Home / Self Care | Payer: BC Managed Care – PPO | Source: Ambulatory Visit | Attending: Internal Medicine | Admitting: Internal Medicine

## 2011-01-17 DIAGNOSIS — N6009 Solitary cyst of unspecified breast: Secondary | ICD-10-CM

## 2011-05-30 ENCOUNTER — Telehealth: Payer: Self-pay | Admitting: Internal Medicine

## 2011-05-30 NOTE — Telephone Encounter (Signed)
OV with any MD next wk Thx

## 2011-05-30 NOTE — Telephone Encounter (Signed)
Last week felt a marble size knot on her right knee, she thinks it has now went under her knee. Pt does have appt on 6/5 with Dr Debby Bud, does she need to be seen before then? Referral to ortho dr?

## 2011-05-31 ENCOUNTER — Ambulatory Visit (INDEPENDENT_AMBULATORY_CARE_PROVIDER_SITE_OTHER): Payer: BC Managed Care – PPO | Admitting: Internal Medicine

## 2011-05-31 ENCOUNTER — Encounter: Payer: Self-pay | Admitting: Internal Medicine

## 2011-05-31 ENCOUNTER — Ambulatory Visit (INDEPENDENT_AMBULATORY_CARE_PROVIDER_SITE_OTHER)
Admission: RE | Admit: 2011-05-31 | Discharge: 2011-05-31 | Disposition: A | Discharge status: Home / Self Care | Payer: BC Managed Care – PPO | Source: Ambulatory Visit | Attending: Internal Medicine | Admitting: Internal Medicine

## 2011-05-31 DIAGNOSIS — M25869 Other specified joint disorders, unspecified knee: Secondary | ICD-10-CM

## 2011-05-31 DIAGNOSIS — I10 Essential (primary) hypertension: Secondary | ICD-10-CM

## 2011-05-31 DIAGNOSIS — E119 Type 2 diabetes mellitus without complications: Secondary | ICD-10-CM

## 2011-05-31 NOTE — Assessment & Plan Note (Signed)
BP Readings from Last 3 Encounters:  05/31/11 118/82  12/28/10 96/68  12/05/10 120/70   The current medical regimen is effective;  continue present plan and medications.

## 2011-05-31 NOTE — Telephone Encounter (Signed)
Pt informed by [employer], Human resources officer

## 2011-05-31 NOTE — Telephone Encounter (Signed)
Left mess for patient to call back.  

## 2011-05-31 NOTE — Patient Instructions (Signed)
It was good to see you today. Suspect your cyst was related to "synovial fluid collection" in your knee - this is normal in knees with arthritis Test(s) ordered today. Your results will be called to you after review (48-72hours after test completion). If any changes need to be made, you will be notified at that time. Ice and elevate knee if recurrent cyst or swelling, call for appointment if worse Baker's Cyst A Baker's cyst is a swelling that forms in the back of the knee. It is a sac-like structure. It is filled with the same fluid that is located in your knee. The fluid located in your knee is necessary because it lubricates the bones and cartilage. It allows them to move over each other more easily. CAUSES   When the knee becomes injured or has soreness (inflammation) present, more fluid forms in the knee. When this happens, the joint lining is pushed out behind the knee and forms the baker's cyst. This cyst may also be caused by inflammation from arthritic conditions and infections. DIAGNOSIS   A Baker's cyst is most often diagnosed with an ultrasound. This is a specialized picture (like an X-ray). It shows a picture by using sound waves. Sometimes a specialized x-ray called an MRI (magnetic resonance imaging) is used. This picks up other problems within a joint if an ultrasound alone cannot make the diagnosis. If the cyst came immediately following an injury, plain x-rays may be used to make a diagnosis. TREATMENT   The treatment depends on the cause of the cyst. But most of these cysts are caused by an inflammation. Anti-inflammatory medications and rest often will get rid of the problem. If the cyst is caused by an infection, medications (antibiotics) will be prescribed to help this. Take the medications as directed. Refer to Home Care Instructions, below, for additional treatment suggestions. HOME CARE INSTRUCTIONS    If the cyst was caused by an injury, for the first 24 hours, while lying  down, keep the injured extremity elevated on 2 pillows.   For the first 24 hours while you are awake, apply ice bags (ice in a plastic bag with a towel around it to prevent frostbite to skin) 3 to 4 times per day for 15 to 20 minutes to the injured area. Then do as directed by your caregiver.   Only take over-the-counter or prescription medicines for pain, discomfort, or fever as directed by your caregiver.  Persistent pain and inability to use the injured area for more than 2 to 3 days are warning signs indicating that you should see a caregiver for a follow-up visit as soon as possible. Persistent pain and swelling indicate that further evaluation, non-weight bearing (use of crutches as instructed), and/or further x-rays are needed. Make a follow-up appointment with your own caregiver. If conservative measures (rest, medications and inactivity) do not help the problem get better, sometimes surgery for removal of the cyst is needed. Reasons for this may be that the cyst is pressing on nerves and/or vessels and causing problems which cannot wait for improvement with conservative treatment. If the problem is caused by injuries to the cartilage in the knee, surgery is often needed for treatment of that problem. MAKE SURE YOU:    Understand these instructions.   Will watch your condition.   Will get help right away if you are not doing well or get worse.  Document Released: 12/31/2004 Document Revised: 12/20/2010 Document Reviewed: 08/19/2007 [facility] Patient Information 2012 [place], [place].

## 2011-05-31 NOTE — Assessment & Plan Note (Signed)
The patient is asked to make an attempt to improve diet and exercise patterns to aid in medical management of this problem.  PCP plans to check a1c next OV Lab Results  Component Value Date   HGBA1C 6.7* 03/17/2009

## 2011-05-31 NOTE — Progress Notes (Signed)
Subjective:    Patient ID: Anna Cooper, female    DOB: June 22, 1950, 61 y.o.   MRN: [id number]  HPI complains of transient "marble" on right knee Noted last week, no recurrence since Not associated with joint swelling  Past Medical History  Diagnosis Date  . Depression   . Hyperlipidemia   . Infantile paralysis   . History of recurrent UTIs   . History of venereal warts   . Hypertension     Review of Systems  Constitutional: Negative for fever and fatigue.  Musculoskeletal: Negative for back pain, joint swelling and gait problem.       Objective:   Physical Exam BP 118/82  Pulse 73  Temp(Src) 98.6 F (37 C) (Oral)  Ht 5\' 4"  (1.626 m)  Wt 165 lb 12.8 oz (75.206 kg)  BMI 28.46 kg/m2  SpO2 97% Wt Readings from Last 3 Encounters:  05/31/11 165 lb 12.8 oz (75.206 kg)  12/05/10 171 lb (77.565 kg)  12/01/09 177 lb (80.287 kg)   Constitutional: She appears well-developed and well-nourished. No distress.  Neck: Normal range of motion. Neck supple. No JVD present. No thyromegaly present.  Cardiovascular: Normal rate, regular rhythm and normal heart sounds.  No murmur heard. No BLE edema. Pulmonary/Chest: Effort normal and breath sounds normal. No respiratory distress. She has no wheezes.  Musculoskeletal: R knee- boggy synovitis - tender to palpation over joint line; FROM and ligamentous function intact Skin: Skin is warm and dry. No rash noted. No erythema.  Psychiatric: She has a normal mood and affect. Her behavior is normal. Judgment and thought content normal.       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

## 2011-06-04 ENCOUNTER — Ambulatory Visit: Payer: BC Managed Care – PPO | Admitting: Internal Medicine

## 2011-06-19 ENCOUNTER — Other Ambulatory Visit (INDEPENDENT_AMBULATORY_CARE_PROVIDER_SITE_OTHER): Payer: BC Managed Care – PPO

## 2011-06-19 ENCOUNTER — Encounter: Payer: Self-pay | Admitting: Internal Medicine

## 2011-06-19 ENCOUNTER — Ambulatory Visit (INDEPENDENT_AMBULATORY_CARE_PROVIDER_SITE_OTHER): Payer: BC Managed Care – PPO | Admitting: Internal Medicine

## 2011-06-19 VITALS — BP 100/70 | HR 81 | Temp 99.0°F | Resp 16 | Wt 167.0 lb

## 2011-06-19 DIAGNOSIS — I10 Essential (primary) hypertension: Secondary | ICD-10-CM

## 2011-06-19 DIAGNOSIS — E119 Type 2 diabetes mellitus without complications: Secondary | ICD-10-CM

## 2011-06-19 DIAGNOSIS — M21619 Bunion of unspecified foot: Secondary | ICD-10-CM

## 2011-06-19 DIAGNOSIS — M21611 Bunion of right foot: Secondary | ICD-10-CM

## 2011-06-19 DIAGNOSIS — E785 Hyperlipidemia, unspecified: Secondary | ICD-10-CM

## 2011-06-19 LAB — COMPREHENSIVE METABOLIC PANEL
ALT: 25 U/L (ref 0–35)
AST: 22 U/L (ref 0–37)
Albumin: 3.9 g/dL (ref 3.5–5.2)
Alkaline Phosphatase: 94 U/L (ref 39–117)
Potassium: 4 mEq/L (ref 3.5–5.1)
Sodium: 141 mEq/L (ref 135–145)
Total Protein: 7.7 g/dL (ref 6.0–8.3)

## 2011-06-19 LAB — HEMOGLOBIN A1C: Hgb A1c MFr Bld: 6.7 % — ABNORMAL HIGH (ref 4.6–6.5)

## 2011-06-19 LAB — HEPATIC FUNCTION PANEL
ALT: 25 U/L (ref 0–35)
Bilirubin, Direct: 0.1 mg/dL (ref 0.0–0.3)
Total Protein: 7.7 g/dL (ref 6.0–8.3)

## 2011-06-19 LAB — LIPID PANEL
LDL Cholesterol: 40 mg/dL (ref 0–99)
Total CHOL/HDL Ratio: 2
VLDL: 8.8 mg/dL (ref 0.0–40.0)

## 2011-06-19 NOTE — Patient Instructions (Signed)
Knee bump - gone  Bunions -doesn't look to bad. For corrective surgery I recommend Dr. Aldean Baker - we will make you an appointment  Hip/leg pain - no evidence of joint disease, suspect this is all muscle strain. Plan - exercise and stretch: go to [email or web address] and in the search bar enter "hip pain and stretch"  Or enter "piriformis syndrome and stretch."   Diabetes - will need to check lab today - A1C

## 2011-06-22 NOTE — Assessment & Plan Note (Signed)
BP Readings from Last 3 Encounters:  06/19/11 100/70  05/31/11 118/82  12/28/10 96/68   Very good control.

## 2011-06-22 NOTE — Assessment & Plan Note (Signed)
Lab Results  Component Value Date   HGBA1C 6.7* 06/19/2011   Good control with life-style management only.

## 2011-06-22 NOTE — Assessment & Plan Note (Signed)
Lab Results  Component Value Date   CHOL 97 06/19/2011   HDL 48.10 06/19/2011   LDLCALC 40 06/19/2011   TRIG 44.0 06/19/2011   CHOLHDL 2 06/19/2011   Excellent control with vytorin. Normal liver functions noted.  Plan - continue present medications.

## 2011-06-22 NOTE — Progress Notes (Signed)
Subjective:    Patient ID: Anna Cooper, female    DOB: 10-26-1950, 61 y.o.   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 These deformities have been getting large over time. She has discomfort wearing dress shoes, especially high heels. She has had not skin breakdown or ulceration of the foot. She has had no other injury.  Anna Cooper also c/o tightness in the right hip with decrease in flexibility which has been progressive over time.  PMH, FamHx and SocHx reviewed for any changes and relevance.    Review of Systems System review is negative for any constitutional, cardiac, pulmonary, GI or neuro symptoms or complaints other than as described in the HPI.     Objective:   Physical Exam Filed Vitals:   06/19/11 0952  BP: 100/70  Pulse: 81  Temp: 99 F (37.2 C)  Resp: 16   Cor- RRR Pulm - normal respirations Ext - valgus deformity of both great toes. There is no hammer toe deformity of toe overlap. There are no calluses or areas of skin breakdown. Hip exam reveals no pain with internal or external rotation of the hip, no pain with AP pressure against the right hip joint and normal adduction w/o pain.       Assessment & Plan:  1. Bunions - her condition is moderate with no sequelae  Plan - she will be referred to Dr. Aldean Baker for consultation.  2. Hip tightness - no evidence of significant DJD.  Plan - referred to [email or web address] for hip stretches.

## 2011-06-23 ENCOUNTER — Encounter: Payer: Self-pay | Admitting: Internal Medicine

## 2011-06-25 ENCOUNTER — Other Ambulatory Visit: Payer: Self-pay | Admitting: Internal Medicine

## 2011-09-03 DIAGNOSIS — Z780 Asymptomatic menopausal state: Secondary | ICD-10-CM | POA: Insufficient documentation

## 2011-09-27 ENCOUNTER — Other Ambulatory Visit: Payer: Self-pay | Admitting: Internal Medicine

## 2011-11-06 ENCOUNTER — Ambulatory Visit (INDEPENDENT_AMBULATORY_CARE_PROVIDER_SITE_OTHER): Payer: BC Managed Care – PPO

## 2011-11-06 DIAGNOSIS — Z23 Encounter for immunization: Secondary | ICD-10-CM

## 2011-11-08 ENCOUNTER — Other Ambulatory Visit: Payer: Self-pay | Admitting: Internal Medicine

## 2011-11-08 DIAGNOSIS — Z1231 Encounter for screening mammogram for malignant neoplasm of breast: Secondary | ICD-10-CM

## 2011-11-11 ENCOUNTER — Ambulatory Visit (INDEPENDENT_AMBULATORY_CARE_PROVIDER_SITE_OTHER): Payer: BC Managed Care – PPO

## 2011-11-11 DIAGNOSIS — Z23 Encounter for immunization: Secondary | ICD-10-CM

## 2011-11-11 DIAGNOSIS — Z2911 Encounter for prophylactic immunotherapy for respiratory syncytial virus (RSV): Secondary | ICD-10-CM

## 2011-12-25 ENCOUNTER — Ambulatory Visit
Admission: RE | Admit: 2011-12-25 | Discharge: 2011-12-25 | Disposition: A | Discharge status: Home / Self Care | Payer: BC Managed Care – PPO | Source: Ambulatory Visit | Attending: Internal Medicine | Admitting: Internal Medicine

## 2011-12-25 DIAGNOSIS — Z1231 Encounter for screening mammogram for malignant neoplasm of breast: Secondary | ICD-10-CM

## 2011-12-31 ENCOUNTER — Encounter: Payer: Self-pay | Admitting: Internal Medicine

## 2011-12-31 ENCOUNTER — Ambulatory Visit (INDEPENDENT_AMBULATORY_CARE_PROVIDER_SITE_OTHER): Payer: BC Managed Care – PPO | Admitting: Internal Medicine

## 2011-12-31 VITALS — BP 122/70 | HR 87 | Temp 98.8°F | Resp 10 | Wt 168.0 lb

## 2011-12-31 DIAGNOSIS — Z0000 Encounter for general adult medical examination without abnormal findings: Secondary | ICD-10-CM

## 2011-12-31 DIAGNOSIS — E119 Type 2 diabetes mellitus without complications: Secondary | ICD-10-CM

## 2011-12-31 DIAGNOSIS — E785 Hyperlipidemia, unspecified: Secondary | ICD-10-CM

## 2011-12-31 DIAGNOSIS — I10 Essential (primary) hypertension: Secondary | ICD-10-CM

## 2011-12-31 MED ORDER — POTASSIUM CHLORIDE ER 10 MEQ PO TBCR: 10.0000 meq | EXTENDED_RELEASE_TABLET | Freq: Every day | ORAL | Status: DC

## 2011-12-31 MED ORDER — AMLODIPINE BESYLATE 10 MG PO TABS: 10.0000 mg | ORAL_TABLET | Freq: Every day | ORAL | Status: DC

## 2011-12-31 MED ORDER — PREDNISOLONE ACETATE 1 % OP SUSP: 1.0000 [drp] | Freq: Two times a day (BID) | OPHTHALMIC | Status: DC

## 2011-12-31 MED ORDER — HYDROCHLOROTHIAZIDE 12.5 MG PO CAPS: 12.5000 mg | ORAL_CAPSULE | Freq: Every day | ORAL | Status: DC

## 2011-12-31 MED ORDER — EZETIMIBE-SIMVASTATIN 10-20 MG PO TABS: 1.0000 | ORAL_TABLET | Freq: Every day | ORAL | Status: DC

## 2011-12-31 NOTE — Progress Notes (Signed)
Subjective:    Patient ID: Anna Cooper, female    DOB: 1950/04/07, 61 y.o.   MRN: [id number]  HPI Anna Cooper presents for annual well woman exam. In the interval she has had bilateral bunionectomies, staged, by Dr. Lajoyce Corners. She had OD cataract extraction and corneal transplant at [facility] with good results. She is also having orthodontia with invisalign braces. She is generally feeling well and doing well. She usually has a regular exercise program, Zumba and walking, which she will resume as her feet heal.   Past Medical History  Diagnosis Date  . Depression   . Hyperlipidemia   . Infantile paralysis   . History of recurrent UTIs   . History of venereal warts   . Hypertension    Past Surgical History  Procedure Date  . Tubal ligation   . Bunionectomy with hammertoe reconstruction 7/13    7/13; right foot, 11/13; left foot  . Eye surgery 09/24/11    right eye; cataract removed; corneal transplant   Family History  Problem Relation Age of Onset  . Cancer Mother     Breast, was in remission but had relapse with mets.  . Diabetes Mother   . Heart attack Father   . Seizures Father   . Heart disease Father     CAD/MI-fatal  . Cancer Sister     Breast Cancer  . Cancer Paternal Uncle     Prostate Cancer  . Cancer Maternal Grandfather     Lung Cancer  . Cancer Maternal Aunt     breast  . Cancer Sister     breast, BRCA +   History   Social History  . Marital Status: Married    Spouse Name: N/A    Number of Children: 2  . Years of Education: 20   Occupational History  . educator    Social History Main Topics  . Smoking status: Never Smoker   . Smokeless tobacco: Never Used  . Alcohol Use: No  . Drug Use: No  . Sexually Active: Not Currently   Other Topics Concern  . Not on file   Social History Narrative   HSG, College Grad, [employer]; [employer] [employer]; [employer]- education. Married '71-seperated-'06.1 son '71, 1 daughter-'78; 2 grandchildren. 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 Daughter had a baby Jun 11, 2008, @nd  Aug 12th,'11. Lives in [place]. Not sexually active    Current Outpatient Prescriptions on File Prior to Visit  Medication Sig Dispense Refill  . amLODipine (NORVASC) 10 MG tablet TAKE ONE TABLET BY MOUTH EVERY DAY  90 tablet  1  . hydrochlorothiazide (MICROZIDE) 12.5 MG capsule TAKE ONE CAPSULE BY MOUTH EVERY DAY  90 capsule  1  . potassium chloride (K-DUR) 10 MEQ tablet TAKE ONE TABLET BY MOUTH EVERY DAY  90 tablet  3  . VYTORIN 10-20 MG per tablet TAKE ONE TABLET BY MOUTH AT BEDTIME  90 tablet  3      Review of Systems Constitutional:  Negative for fever, chills, activity change and unexpected weight change.  HEENT:  Negative for hearing loss, ear pain, congestion, neck stiffness and postnasal drip. Negative for sore throat or swallowing problems. Negative for dental complaints.   Eyes: Negative for vision loss or change in visual acuity.  Respiratory: Negative for chest tightness and wheezing. Negative for DOE.   Cardiovascular: Negative for chest pain or palpitations. No decreased exercise tolerance Gastrointestinal: No change in bowel habit. No bloating or  gas. No reflux or indigestion Genitourinary: Negative for urgency, frequency, flank pain and difficulty urinating.  Musculoskeletal: Negative for myalgias, back pain, arthralgias and gait problem.  Neurological: Negative for dizziness, tremors, weakness and headaches.  Hematological: Negative for adenopathy.  Psychiatric/Behavioral: Negative for behavioral problems and dysphoric mood.       Objective:   Physical Exam Filed Vitals:   12/31/11 1327  BP: 122/70  Pulse: 87  Temp: 98.8 F (37.1 C)  Resp: 10   Wt Readings from Last 3 Encounters:  12/31/11 168 lb (76.204 kg)  06/19/11 167 lb (75.751 kg)  05/31/11 165 lb 12.8 oz (75.206 kg)   Gen'l: well nourished, well developed Woman in no distress HEENT - [place]/AT, EACs/TMs  normal, oropharynx with native dentition in good condition, no buccal or palatal lesions, posterior pharynx clear, mucous membranes moist. C&S clear, PERRLA, fundi - normal Neck - supple, no thyromegaly Nodes- negative submental, cervical, supraclavicular regions Chest - no deformity, no CVAT Lungs - clear without rales, wheezes. No increased work of breathing Breast - - Skin normal, nipples w/o discharge, no fixed mass or lesion, no axillary adenopathy. Cardiovascular - regular rate and rhythm, quiet precordium, no murmurs, rubs or gallops, 2+ radial, DP and PT pulses Abdomen - BS+ x 4, no HSM, no guarding or rebound or tenderness Pelvic - deferred until 2014 Rectal - deferred  Extremities - no clubbing, cyanosis, edema or deformity.  Neuro - A&O x 3, CN II-XII normal, motor strength normal and equal, DTRs 2+ and symmetrical biceps, radial, and patellar tendons. Cerebellar - no tremor, no rigidity, fluid movement and normal gait. Derm - Head, neck, back, abdomen and extremities without suspicious lesions  Lab Results  Component Value Date   WBC 8.1 11/30/2010   HGB 13.5 11/30/2010   HCT 41.0 11/30/2010   PLT 283.0 11/30/2010   GLUCOSE 92 01/01/2012   CHOL 136 01/01/2012   TRIG 98.0 01/01/2012   HDL 46.10 01/01/2012   LDLCALC 70 01/01/2012        ALT 21 01/01/2012   AST 20 01/01/2012        NA 138 01/01/2012   K 3.3* 01/01/2012   CL 101 01/01/2012   CREATININE 0.7 01/01/2012   BUN 7 01/01/2012   CO2 31 01/01/2012   TSH 0.52 11/30/2010   HGBA1C 6.5 01/01/2012         Assessment & Plan:

## 2011-12-31 NOTE — Patient Instructions (Addendum)
Thanks for coming to see me.  Your exam is normal. Labs are ordered. You will receive a full report in the mail and you can also check Mychart for results.  Have  A Happy Holiday.

## 2012-01-01 ENCOUNTER — Other Ambulatory Visit (INDEPENDENT_AMBULATORY_CARE_PROVIDER_SITE_OTHER): Payer: BC Managed Care – PPO

## 2012-01-01 DIAGNOSIS — I10 Essential (primary) hypertension: Secondary | ICD-10-CM

## 2012-01-01 DIAGNOSIS — E785 Hyperlipidemia, unspecified: Secondary | ICD-10-CM

## 2012-01-01 DIAGNOSIS — E119 Type 2 diabetes mellitus without complications: Secondary | ICD-10-CM

## 2012-01-01 LAB — LIPID PANEL
Cholesterol: 136 mg/dL (ref 0–200)
LDL Cholesterol: 70 mg/dL (ref 0–99)

## 2012-01-01 LAB — COMPREHENSIVE METABOLIC PANEL
AST: 20 U/L (ref 0–37)
Albumin: 4 g/dL (ref 3.5–5.2)
BUN: 7 mg/dL (ref 6–23)
Calcium: 9.1 mg/dL (ref 8.4–10.5)
Chloride: 101 mEq/L (ref 96–112)
Potassium: 3.3 mEq/L — ABNORMAL LOW (ref 3.5–5.1)

## 2012-01-01 LAB — HEPATIC FUNCTION PANEL
Albumin: 4 g/dL (ref 3.5–5.2)
Bilirubin, Direct: 0 mg/dL (ref 0.0–0.3)
Total Protein: 8 g/dL (ref 6.0–8.3)

## 2012-01-01 LAB — HEMOGLOBIN A1C: Hgb A1c MFr Bld: 6.5 % (ref 4.6–6.5)

## 2012-01-02 NOTE — Assessment & Plan Note (Signed)
BP Readings from Last 3 Encounters:  12/31/11 122/70  06/19/11 100/70  05/31/11 118/82   Excellent control.

## 2012-01-02 NOTE — Assessment & Plan Note (Signed)
Lab Results  Component Value Date   HGBA1C 6.5 01/01/2012   Good control with life-style management with A1C better than goal of 7 %or less.  Plan Continue no sugar low carb diet and regular exercise (once recovered from foot surgery)

## 2012-01-02 NOTE — Assessment & Plan Note (Signed)
Interval medical history notable for bilateral bunionectomies and eye surgery. She is generally doing very well. She is current with colorectal and breast cancer screening. Immunizations are up to date. Lab results are in normal range.  In summary - a very nice woman who is medically stable. She is encouraged to resume her exercise program. She will return in 6 months or sooner as needed.

## 2012-01-02 NOTE — Assessment & Plan Note (Signed)
LDL is better than goal of 100 or less. Liver functions are normal.  Plan Continue present regimen

## 2012-01-22 ENCOUNTER — Encounter: Payer: Self-pay | Admitting: Internal Medicine

## 2012-01-22 ENCOUNTER — Ambulatory Visit (INDEPENDENT_AMBULATORY_CARE_PROVIDER_SITE_OTHER): Payer: BC Managed Care – PPO | Admitting: Internal Medicine

## 2012-01-22 VITALS — BP 110/68 | HR 81 | Temp 98.3°F | Resp 12 | Wt 167.1 lb

## 2012-01-22 DIAGNOSIS — R413 Other amnesia: Secondary | ICD-10-CM

## 2012-01-22 NOTE — Patient Instructions (Addendum)
Memory assessment - you did well with the MMSE - you had just a little trouble repeating numbers in reverse order.  Recommendations: 1. Brain exercise - crossword puzzles, card games, chess, writing, etc.                     [email or web address] - computer based neuro-science based brain exercises         Refer to Behavioral Medicine for more detailed testing.     To see Judithe Modest, MSW     Monday, January 13th at 3 PM with cognitive testing at [facility]

## 2012-01-22 NOTE — Progress Notes (Signed)
Subjective:    Patient ID: Anna Cooper, female    DOB: [birth year], 62 y.o.   MRN: [id number]  HPI Ms. Foti returns today for memory screening: she has notice that she is more forgetful. She has been late paying bills, has trouble remembering instructions, has increased difficulty navigating around town, missed appointments and meetings.  PMH, FamHx and SocHx reviewed for any changes and relevance. Current Outpatient Prescriptions on File Prior to Visit  Medication Sig Dispense Refill  . amLODipine (NORVASC) 10 MG tablet Take 1 tablet (10 mg total) by mouth daily.  90 tablet  1  . ezetimibe-simvastatin (VYTORIN) 10-20 MG per tablet Take 1 tablet by mouth at bedtime.  30 tablet  11  . hydrochlorothiazide (MICROZIDE) 12.5 MG capsule Take 1 capsule (12.5 mg total) by mouth daily.  30 capsule  11  . potassium chloride (K-DUR) 10 MEQ tablet Take 1 tablet (10 mEq total) by mouth daily.  30 tablet  11  . prednisoLONE acetate (PRED FORTE) 1 % ophthalmic suspension Place 1 drop into the right eye 2 (two) times daily.  5 mL  0  . sodium chloride (MURO 128) 5 % ophthalmic ointment Apply 1 drop to eye at bedtime as needed.          Review of Systems System review is negative for any constitutional, cardiac, pulmonary, GI or neuro symptoms or complaints other than as described in the HPI.     Objective:   Physical Exam Filed Vitals:   01/22/12 1454  BP: 110/68  Pulse: 81  Temp: 98.3 F (36.8 C)  Resp: 12   MMSE: 1. Day,date,year - yes, yes, yes 2. Content: president-  [facility] - ok  Current events - ok 3. Number repitition: 5 fwd - ok  5 rev -  1 error; 4 rev - 1 error       World reversed - ok 4. 3 word recall - ok 5. Serial 7's -  ok    , nickles in $1.25-   ok     Change making - ok 6. Naming objects -    ok       4 legged creatures - ok 7. Parables:  Glass [place] -  ok    Rolling stone - ok 8. Judgement:  Letter  ok        Fire ok 9. Clock face exercise       Assessment &  Plan:  Memory loss - Memory assessment - you did well with the MMSE - you had just a little trouble repeating numbers in reverse order.  Recommendations: 1. Brain exercise - crossword puzzles, card games, chess, writing, etc.                     [email or web address] - computer based neuro-science based brain exercises         Refer to Behavioral Medicine for more detailed testing.     To see Judithe Modest, MSW     Monday, January 13th at 3 PM with cognitive testing at [facility]

## 2012-01-27 ENCOUNTER — Ambulatory Visit: Payer: BC Managed Care – PPO | Admitting: Licensed Clinical Social Worker

## 2012-02-03 ENCOUNTER — Ambulatory Visit: Payer: BC Managed Care – PPO | Admitting: Licensed Clinical Social Worker

## 2012-02-12 DIAGNOSIS — Z947 Corneal transplant status: Secondary | ICD-10-CM | POA: Insufficient documentation

## 2012-07-15 DIAGNOSIS — Z961 Presence of intraocular lens: Secondary | ICD-10-CM | POA: Insufficient documentation

## 2012-07-15 DIAGNOSIS — H18519 Endothelial corneal dystrophy, unspecified eye: Secondary | ICD-10-CM | POA: Insufficient documentation

## 2012-07-21 ENCOUNTER — Other Ambulatory Visit: Payer: Self-pay | Admitting: Internal Medicine

## 2012-10-20 ENCOUNTER — Other Ambulatory Visit: Payer: Self-pay

## 2012-10-20 DIAGNOSIS — Z1231 Encounter for screening mammogram for malignant neoplasm of breast: Secondary | ICD-10-CM

## 2012-10-28 ENCOUNTER — Ambulatory Visit (INDEPENDENT_AMBULATORY_CARE_PROVIDER_SITE_OTHER): Payer: BC Managed Care – PPO

## 2012-10-28 DIAGNOSIS — Z23 Encounter for immunization: Secondary | ICD-10-CM

## 2012-11-25 ENCOUNTER — Ambulatory Visit (INDEPENDENT_AMBULATORY_CARE_PROVIDER_SITE_OTHER): Payer: BC Managed Care – PPO | Admitting: Internal Medicine

## 2012-11-25 ENCOUNTER — Encounter: Payer: Self-pay | Admitting: Internal Medicine

## 2012-11-25 VITALS — BP 124/82 | HR 64 | Temp 98.0°F | Wt 170.8 lb

## 2012-11-25 DIAGNOSIS — L989 Disorder of the skin and subcutaneous tissue, unspecified: Secondary | ICD-10-CM | POA: Insufficient documentation

## 2012-11-25 NOTE — Assessment & Plan Note (Signed)
2-3 mm tan raised mole right parietal scalp (Nov '14)  Plan Recheck in 6 months for change in size or appearance.

## 2012-11-25 NOTE — Patient Instructions (Signed)
Scalp lesion - at the right parietal scale a benign appearing mole approximately 2-66mm iin diameter.  Plan  recheck in 6 months to be sure there is no significant change in size or appearance.

## 2012-11-25 NOTE — Progress Notes (Signed)
Subjective:    Patient ID: Anna Cooper, female    DOB: Nov 27, 1950, 62 y.o.   MRN: [id number]  HPI Anna Cooper presents for evaluation of a Knot on the scalp. NOt painful, no drainage.  PMH, FamHx and SocHx reviewed for any changes and relevance.  Current Outpatient Prescriptions on File Prior to Visit  Medication Sig Dispense Refill  . amLODipine (NORVASC) 10 MG tablet TAKE ONE TABLET BY MOUTH EVERY DAY  90 tablet  1  . ezetimibe-simvastatin (VYTORIN) 10-20 MG per tablet Take 1 tablet by mouth at bedtime.  30 tablet  11  . hydrochlorothiazide (MICROZIDE) 12.5 MG capsule Take 1 capsule (12.5 mg total) by mouth daily.  30 capsule  11  . potassium chloride (K-DUR) 10 MEQ tablet Take 1 tablet (10 mEq total) by mouth daily.  30 tablet  11  . prednisoLONE acetate (PRED FORTE) 1 % ophthalmic suspension Place 1 drop into the right eye 2 (two) times daily.  5 mL  0   No current facility-administered medications on file prior to visit.      Review of Systems System review is negative for any constitutional, cardiac, pulmonary, GI or neuro symptoms or complaints other than as described in the HPI.      Objective:   Physical Exam Filed Vitals:   11/25/12 1053  BP: 124/82  Pulse: 64  Temp: 98 F (36.7 C)   Derm- 2-3 mm tan raised mole right parietal scalp.       Assessment & Plan:

## 2012-11-25 NOTE — Progress Notes (Signed)
Pre visit review using our clinic review tool, if applicable. No additional management support is needed unless otherwise documented below in the visit note. 

## 2013-01-08 ENCOUNTER — Other Ambulatory Visit: Payer: Self-pay | Admitting: Internal Medicine

## 2013-01-19 ENCOUNTER — Encounter: Payer: Self-pay | Admitting: Internal Medicine

## 2013-01-19 ENCOUNTER — Other Ambulatory Visit (INDEPENDENT_AMBULATORY_CARE_PROVIDER_SITE_OTHER): Payer: BC Managed Care – PPO

## 2013-01-19 ENCOUNTER — Ambulatory Visit
Admission: RE | Admit: 2013-01-19 | Discharge: 2013-01-19 | Disposition: A | Discharge status: Home / Self Care | Payer: BC Managed Care – PPO | Source: Ambulatory Visit

## 2013-01-19 ENCOUNTER — Other Ambulatory Visit (HOSPITAL_COMMUNITY)
Admission: RE | Admit: 2013-01-19 | Discharge: 2013-01-19 | Disposition: A | Discharge status: Home / Self Care | Payer: BC Managed Care – PPO | Source: Ambulatory Visit | Attending: Internal Medicine | Admitting: Internal Medicine

## 2013-01-19 ENCOUNTER — Ambulatory Visit (INDEPENDENT_AMBULATORY_CARE_PROVIDER_SITE_OTHER): Payer: BC Managed Care – PPO | Admitting: Internal Medicine

## 2013-01-19 VITALS — BP 120/86 | HR 63 | Temp 99.9°F | Ht 63.5 in | Wt 166.0 lb

## 2013-01-19 DIAGNOSIS — Z1231 Encounter for screening mammogram for malignant neoplasm of breast: Secondary | ICD-10-CM

## 2013-01-19 DIAGNOSIS — Z124 Encounter for screening for malignant neoplasm of cervix: Secondary | ICD-10-CM

## 2013-01-19 DIAGNOSIS — E785 Hyperlipidemia, unspecified: Secondary | ICD-10-CM

## 2013-01-19 DIAGNOSIS — E119 Type 2 diabetes mellitus without complications: Secondary | ICD-10-CM

## 2013-01-19 DIAGNOSIS — I10 Essential (primary) hypertension: Secondary | ICD-10-CM

## 2013-01-19 DIAGNOSIS — Z01419 Encounter for gynecological examination (general) (routine) without abnormal findings: Secondary | ICD-10-CM | POA: Insufficient documentation

## 2013-01-19 DIAGNOSIS — E876 Hypokalemia: Secondary | ICD-10-CM

## 2013-01-19 DIAGNOSIS — Z0000 Encounter for general adult medical examination without abnormal findings: Secondary | ICD-10-CM

## 2013-01-19 LAB — COMPREHENSIVE METABOLIC PANEL
ALBUMIN: 4.2 g/dL (ref 3.5–5.2)
ALT: 18 U/L (ref 0–35)
AST: 18 U/L (ref 0–37)
Alkaline Phosphatase: 107 U/L (ref 39–117)
BUN: 7 mg/dL (ref 6–23)
CALCIUM: 9 mg/dL (ref 8.4–10.5)
CHLORIDE: 101 meq/L (ref 96–112)
CO2: 31 meq/L (ref 19–32)
CREATININE: 0.7 mg/dL (ref 0.4–1.2)
GFR: 118.11 mL/min (ref >60.00)
GLUCOSE: 94 mg/dL (ref 70–99)
POTASSIUM: 2.8 meq/L — AB (ref 3.5–5.1)
Sodium: 140 mEq/L (ref 135–145)
Total Bilirubin: 0.4 mg/dL (ref 0.3–1.2)
Total Protein: 7.9 g/dL (ref 6.0–8.3)

## 2013-01-19 LAB — LIPID PANEL
Cholesterol: 120 mg/dL (ref 0–200)
HDL: 49.6 mg/dL (ref >39.00)
LDL CALC: 57 mg/dL (ref 0–99)
Total CHOL/HDL Ratio: 2
Triglycerides: 65 mg/dL (ref 0.0–149.0)
VLDL: 13 mg/dL (ref 0.0–40.0)

## 2013-01-19 LAB — HEMOGLOBIN A1C: Hgb A1c MFr Bld: 6.4 % (ref 4.6–6.5)

## 2013-01-19 NOTE — Progress Notes (Signed)
Pre visit review using our clinic review tool, if applicable. No additional management support is needed unless otherwise documented below in the visit note. 

## 2013-01-19 NOTE — Progress Notes (Signed)
Subjective:    Patient ID: Anna Cooper, female    DOB: [birth year], 63 y.o.   MRN: [id number]  HPI The patient is here for annual wellness examination and management of other chronic and acute problems.  She still has a small knot on the right parietal scalp. She has develop a new raised nodule at the DIP of the right index finger.    The risk factors are reflected in the social history.  The roster of all physicians providing medical care to patient - is listed in the Snapshot section of the chart.  Activities of daily living: The patient is 100% inedpendent in all ADLs: dressing, toileting, feeding as well as independent mobility  Home safety : The patient has smoke detectors in the home. They wear seatbelts. Falls - fell down the stair in Nov.  No firearms at homeThere is no violence in the home.   There is no risks for hepatitis, STDs or HIV. There is no   history of blood transfusion. They have no travel history to infectious disease endemic areas of the world.  The patient has seen their dentist in the last six month. They have seen their eye doctor in the last year. They have hearing difficulty and have not had audiologic testing in the last year.    They do not  have excessive sun exposure. Discussed the need for sun protection: hats, long sleeves and use of sunscreen if there is significant sun exposure.   Diet: the importance of a healthy diet is discussed. They do have a healthy diet.  The patient has a regular exercise program.  The benefits of regular aerobic exercise were discussed.  Depression screen: there are no signs or vegative symptoms of depression- irritability, change in appetite, anhedonia, sadness/tearfullness.  Cognitive assessment: the patient manages all their financial and personal affairs and is actively engaged.   The following portions of the patient's history were reviewed and updated as appropriate: allergies, current medications, past family  history, past medical history,  past surgical history, past social history  and problem list.  Vision, hearing, body mass index were assessed and reviewed.   During the course of the visit the patient was educated and counseled about appropriate screening and preventive services including : fall prevention , diabetes screening, nutrition counseling, colorectal cancer screening, and recommended immunizations.  Past Medical History  Diagnosis Date  . Depression   . Hyperlipidemia   . Infantile paralysis   . History of recurrent UTIs   . History of venereal warts   . Hypertension    Past Surgical History  Procedure Laterality Date  . Tubal ligation    . Bunionectomy with hammertoe reconstruction  7/13    7/13; right foot, 11/13; left foot  . Eye surgery  09/24/11    right eye; cataract removed; corneal transplant   Family History  Problem Relation Age of Onset  . Cancer Mother     Breast, was in remission but had relapse with mets.  . Diabetes Mother   . Heart attack Father   . Seizures Father   . Heart disease Father     CAD/MI-fatal  . Cancer Sister     Breast Cancer  . Cancer Paternal Uncle     Prostate Cancer  . Cancer Maternal Grandfather     Lung Cancer  . Cancer Maternal Aunt     breast  . Cancer Sister     breast, BRCA +   History   Social  Subjective:    Patient ID: Anna Cooper, female    DOB: [birth year], 63 y.o.   MRN: [id number]  HPI The patient is here for annual wellness examination and management of other chronic and acute problems.  She still has a small knot on the right parietal scalp. She has develop a new raised nodule at the DIP of the right index finger.    The risk factors are reflected in the social history.  The roster of all physicians providing medical care to patient - is listed in the Snapshot section of the chart.  Activities of daily living: The patient is 100% inedpendent in all ADLs: dressing, toileting, feeding as well as independent mobility  Home safety : The patient has smoke detectors in the home. They wear seatbelts. Falls - fell down the stair in Nov.  No firearms at homeThere is no violence in the home.   There is no risks for hepatitis, STDs or HIV. There is no   history of blood transfusion. They have no travel history to infectious disease endemic areas of the world.  The patient has seen their dentist in the last six month. They have seen their eye doctor in the last year. They have hearing difficulty and have not had audiologic testing in the last year.    They do not  have excessive sun exposure. Discussed the need for sun protection: hats, long sleeves and use of sunscreen if there is significant sun exposure.   Diet: the importance of a healthy diet is discussed. They do have a healthy diet.  The patient has a regular exercise program.  The benefits of regular aerobic exercise were discussed.  Depression screen: there are no signs or vegative symptoms of depression- irritability, change in appetite, anhedonia, sadness/tearfullness.  Cognitive assessment: the patient manages all their financial and personal affairs and is actively engaged.   The following portions of the patient's history were reviewed and updated as appropriate: allergies, current medications, past family  history, past medical history,  past surgical history, past social history  and problem list.  Vision, hearing, body mass index were assessed and reviewed.   During the course of the visit the patient was educated and counseled about appropriate screening and preventive services including : fall prevention , diabetes screening, nutrition counseling, colorectal cancer screening, and recommended immunizations.  Past Medical History  Diagnosis Date  . Depression   . Hyperlipidemia   . Infantile paralysis   . History of recurrent UTIs   . History of venereal warts   . Hypertension    Past Surgical History  Procedure Laterality Date  . Tubal ligation    . Bunionectomy with hammertoe reconstruction  7/13    7/13; right foot, 11/13; left foot  . Eye surgery  09/24/11    right eye; cataract removed; corneal transplant   Family History  Problem Relation Age of Onset  . Cancer Mother     Breast, was in remission but had relapse with mets.  . Diabetes Mother   . Heart attack Father   . Seizures Father   . Heart disease Father     CAD/MI-fatal  . Cancer Sister     Breast Cancer  . Cancer Paternal Uncle     Prostate Cancer  . Cancer Maternal Grandfather     Lung Cancer  . Cancer Maternal Aunt     breast  . Cancer Sister     breast, BRCA +   History   Social  Subjective:    Patient ID: Anna Cooper, female    DOB: 25-Jan-1950, 63 y.o.   MRN: [id number]  HPI The patient is here for annual wellness examination and management of other chronic and acute problems.  She still has a small knot on the right parietal scalp. She has develop a new raised nodule at the DIP of the right index finger.    The risk factors are reflected in the social history.  The roster of all physicians providing medical care to patient - is listed in the Snapshot section of the chart.  Activities of daily living: The patient is 100% inedpendent in all ADLs: dressing, toileting, feeding as well as independent mobility  Home safety : The patient has smoke detectors in the home. They wear seatbelts. Falls - fell down the stair in Nov.  No firearms at homeThere is no violence in the home.   There is no risks for hepatitis, STDs or HIV. There is no   history of blood transfusion. They have no travel history to infectious disease endemic areas of the world.  The patient has seen their dentist in the last six month. They have seen their eye doctor in the last year. They have hearing difficulty and have not had audiologic testing in the last year.    They do not  have excessive sun exposure. Discussed the need for sun protection: hats, long sleeves and use of sunscreen if there is significant sun exposure.   Diet: the importance of a healthy diet is discussed. They do have a healthy diet.  The patient has a regular exercise program.  The benefits of regular aerobic exercise were discussed.  Depression screen: there are no signs or vegative symptoms of depression- irritability, change in appetite, anhedonia, sadness/tearfullness.  Cognitive assessment: the patient manages all their financial and personal affairs and is actively engaged.   The following portions of the patient's history were reviewed and updated as appropriate: allergies, current medications, past family  history, past medical history,  past surgical history, past social history  and problem list.  Vision, hearing, body mass index were assessed and reviewed.   During the course of the visit the patient was educated and counseled about appropriate screening and preventive services including : fall prevention , diabetes screening, nutrition counseling, colorectal cancer screening, and recommended immunizations.  Past Medical History  Diagnosis Date  . Depression   . Hyperlipidemia   . Infantile paralysis   . History of recurrent UTIs   . History of venereal warts   . Hypertension    Past Surgical History  Procedure Laterality Date  . Tubal ligation    . Bunionectomy with hammertoe reconstruction  7/13    7/13; right foot, 11/13; left foot  . Eye surgery  09/24/11    right eye; cataract removed; corneal transplant   Family History  Problem Relation Age of Onset  . Cancer Mother     Breast, was in remission but had relapse with mets.  . Diabetes Mother   . Heart attack Father   . Seizures Father   . Heart disease Father     CAD/MI-fatal  . Cancer Sister     Breast Cancer  . Cancer Paternal Uncle     Prostate Cancer  . Cancer Maternal Grandfather     Lung Cancer  . Cancer Maternal Aunt     breast  . Cancer Sister     breast, BRCA +   History   Social

## 2013-01-20 ENCOUNTER — Telehealth: Payer: Self-pay

## 2013-01-20 NOTE — Assessment & Plan Note (Signed)
BP Readings from Last 3 Encounters:  01/19/13 120/86  11/25/12 124/82  01/22/12 110/68   Lab - low Potassium otherwise normal  Plan Continue present medications  Potassium replacement

## 2013-01-20 NOTE — Telephone Encounter (Signed)
Message copied by Noreene LarssonANDREWS, Alvon Nygaard R on Wed Jan 20, 2013  9:01 AM ------      Message from: Illene RegulusNORINS, MICHAEL E      Created: Wed Jan 20, 2013  8:12 AM       GOOD Dewitt HoesMORNING, Brrrrrrrrrr            Please call Ms. Fredric MareBailey: she is to take 4 tabs of Potassium 10 meq this AM and again in the PM. She should then continue 10 meq daily.             She will nee follow up lab on Monday, Jan 12th.            She should also stop upstairs for a Prevnar injection.            thanks ------

## 2013-01-20 NOTE — Telephone Encounter (Signed)
Phone call to patient. She has been advised to take 4 tabs of Potassium 10 meq this am and then again in the pm and then to continue 10 meq daily. She is aware to come to lab on Mon Jan 12. She is also aware to come for a nurse visit for Prevnar after lab work on the 12th. She was transferred to scheduling desk for nurse visit appt.

## 2013-01-20 NOTE — Assessment & Plan Note (Signed)
Interval h/o unremarkable. Physical exam is normal. Lab reviewed - normal except for low K. She is current with colorectal and breast cancer screening. Immunizations are up to date with Prevnar being due.  In summary  A nice woman who is doing well with chronic disease management.

## 2013-01-20 NOTE — Assessment & Plan Note (Signed)
Lab Results  Component Value Date   HGBA1C 6.4 01/19/2013   Good control w/o medication  Plan1 continue life style management

## 2013-01-20 NOTE — Assessment & Plan Note (Signed)
Lab results reveal excellent control with LDL well below goal of 100 or less.  Liver functions are normal.  Plan Continue present medications

## 2013-01-25 ENCOUNTER — Ambulatory Visit: Payer: BC Managed Care – PPO | Admitting: *Deleted

## 2013-01-25 ENCOUNTER — Other Ambulatory Visit (INDEPENDENT_AMBULATORY_CARE_PROVIDER_SITE_OTHER): Payer: BC Managed Care – PPO

## 2013-01-25 DIAGNOSIS — E876 Hypokalemia: Secondary | ICD-10-CM

## 2013-01-25 DIAGNOSIS — Z23 Encounter for immunization: Secondary | ICD-10-CM

## 2013-01-25 LAB — POTASSIUM: Potassium: 3.5 mEq/L (ref 3.5–5.1)

## 2013-01-25 MED ORDER — PNEUMOCOCCAL 13-VAL CONJ VACC IM SUSP: 0.5000 mL | INTRAMUSCULAR | Status: DC

## 2013-01-29 ENCOUNTER — Other Ambulatory Visit: Payer: Self-pay | Admitting: Internal Medicine

## 2013-02-01 ENCOUNTER — Telehealth: Payer: Self-pay | Admitting: Internal Medicine

## 2013-02-01 NOTE — Telephone Encounter (Signed)
Patient Information:  Caller Name: Amadeo GarnetFrenchie  Phone: [phone]  Patient: Anna Cooper, Anna Cooper  Gender: Female  DOB: 1950/07/06  Age: [age] Years  PCP: Illene RegulusNorins, Michael (Adults only)  Office Follow Up:  Does the office need to follow up with this patient?: No  Instructions For The Office: N/A  RN Note:  Reviewed Tylenol and Motrin adult doses.  Symptoms  Reason For Call & Symptoms: 01/29/13 in the night having chills, fever, scratchy throat,  ha, congestion;  In bed all day Saturday, was feeling a little better Sunday.  Reviewed Health History In EMR: Yes  Reviewed Medications In EMR: Yes  Reviewed Allergies In EMR: Yes  Reviewed Surgeries / Procedures: Yes  Date of Onset of Symptoms: 01/29/2013  Treatments Tried: Rest, liquids  Treatments Tried Worked: No  Guideline(s) Used:  Influenza - Seasonal  Disposition Per Guideline:   Home Care  Reason For Disposition Reached:   Probable influenza with no complications and not HIGH RISK  Advice Given:  Treating the Symptoms of Flu  Fever, Muscle Aches, and Headache: For fever more than 101 F (38.3 C), muscle aches, and headaches, take acetaminophen every 4-6 hours (Adults 650 mg) OR ibuprofen every 6-8 hours (Adults 400-600 mg).  Sore Throat: Use throat lozenges, hard candy or warm chicken broth.  Cough: Use cough drops.  Hydrate: Drink extra liquids. If the air in your home is dry, use a humidifier.  Isolation is Needed Until After the Fever is Gone:   The CDC recommends that people with influenza-like illness remain at home until at least 24 hours after they are free of fever (100 F or 37.8C).  Do NOT go to work or school.  Expected Course  : The fever lasts 2-3 days, the runny nose 5-10 days, and the cough 2-3 weeks.  Expected Course  : The fever lasts 2-3 days, the runny nose 5-10 days, and the cough 2-3 weeks.  Call Back If:  Fever lasts more than 3 days  Runny nose lasts more than 10 days  Cough lasts more than 3 weeks  You become short of breath or worse.  Patient Will Follow Care Advice:  YES

## 2013-03-09 ENCOUNTER — Encounter: Payer: Self-pay | Admitting: Internal Medicine

## 2013-04-22 ENCOUNTER — Other Ambulatory Visit: Payer: Self-pay

## 2013-06-21 ENCOUNTER — Telehealth: Payer: Self-pay | Admitting: *Deleted

## 2013-06-21 MED ORDER — POTASSIUM CHLORIDE CRYS ER 10 MEQ PO TBCR: EXTENDED_RELEASE_TABLET | ORAL | Status: DC

## 2013-06-21 NOTE — Telephone Encounter (Signed)
Refill done.  

## 2013-07-19 ENCOUNTER — Other Ambulatory Visit: Payer: Self-pay | Admitting: Internal Medicine

## 2013-08-25 ENCOUNTER — Other Ambulatory Visit: Payer: Self-pay | Admitting: *Deleted

## 2013-08-25 MED ORDER — POTASSIUM CHLORIDE CRYS ER 10 MEQ PO TBCR: EXTENDED_RELEASE_TABLET | ORAL | Status: DC

## 2013-10-15 ENCOUNTER — Other Ambulatory Visit: Payer: Self-pay | Admitting: Geriatric Medicine

## 2013-10-15 MED ORDER — HYDROCHLOROTHIAZIDE 12.5 MG PO CAPS: ORAL_CAPSULE | ORAL | Status: DC

## 2013-11-11 ENCOUNTER — Other Ambulatory Visit: Payer: Self-pay | Admitting: Family Medicine

## 2013-11-11 NOTE — Telephone Encounter (Signed)
Refill done. Pt must keep future appt with Dr. Dorise HissKollar

## 2013-11-16 ENCOUNTER — Ambulatory Visit (INDEPENDENT_AMBULATORY_CARE_PROVIDER_SITE_OTHER): Payer: BC Managed Care – PPO | Admitting: Geriatric Medicine

## 2013-11-16 ENCOUNTER — Other Ambulatory Visit: Payer: Self-pay

## 2013-11-16 ENCOUNTER — Ambulatory Visit: Payer: BC Managed Care – PPO

## 2013-11-16 DIAGNOSIS — Z23 Encounter for immunization: Secondary | ICD-10-CM

## 2013-11-16 DIAGNOSIS — Z1231 Encounter for screening mammogram for malignant neoplasm of breast: Secondary | ICD-10-CM

## 2013-11-22 ENCOUNTER — Encounter: Payer: Self-pay | Admitting: Family

## 2013-11-22 ENCOUNTER — Ambulatory Visit (INDEPENDENT_AMBULATORY_CARE_PROVIDER_SITE_OTHER): Payer: BC Managed Care – PPO | Admitting: Family

## 2013-11-22 VITALS — BP 120/78 | HR 83 | Temp 97.7°F | Resp 18 | Ht 64.0 in | Wt 171.0 lb

## 2013-11-22 DIAGNOSIS — E119 Type 2 diabetes mellitus without complications: Secondary | ICD-10-CM

## 2013-11-22 DIAGNOSIS — E785 Hyperlipidemia, unspecified: Secondary | ICD-10-CM

## 2013-11-22 MED ORDER — EZETIMIBE-SIMVASTATIN 10-20 MG PO TABS: ORAL_TABLET | ORAL | Status: DC

## 2013-11-22 NOTE — Progress Notes (Signed)
Pre visit review using our clinic review tool, if applicable. No additional management support is needed unless otherwise documented below in the visit note. 

## 2013-11-22 NOTE — Patient Instructions (Signed)
Thank you for choosing [facility].  Summary/Instructions:   Please stop by the lab at your convenience for your blood work in a FASTING state - no food for 8 hours may have black coffee or water if needed.  We will plan to follow up pending your lab results.   Your prescription has been sent to your pharmacy.

## 2013-11-22 NOTE — Assessment & Plan Note (Signed)
Currently stable on Vytorin 10-20. Obtain lipid panel when fasting. Pt would like trial off of medication pending lab result. Continue current Vytorin now until lab work received.

## 2013-11-22 NOTE — Assessment & Plan Note (Signed)
Currently stable with lifestyle control. Discussed about not needing to test at home at this point. Obtain A1c and BMET. Diabetic foot exam completed. Continue current lifestyle management pending lab work.

## 2013-11-22 NOTE — Progress Notes (Signed)
Subjective:    Patient ID: Anna Cooper, female    DOB: 11-Mar-1950, 63 y.o.   MRN: [id number]  Chief Complaint  Patient presents with  . Follow-up    wants med refill and blood work    HPI:  Anna Cooper is a 63 y.o. female who presents today for a follow-up.   1) Diabetes - Not currently taking any medications. Using diet and exercise to maintain. Reports some increase in urination that she notices and some mild headaches on occasion. 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 Last foot exam is overdue.  Lab Results  Component Value Date   HGBA1C 6.4 01/19/2013   2) Hyperlipidemia - Currently taking Vytorin with no reported side effects. Would like to get her cholesterol checked again.  Lab Results  Component Value Date   CHOL 120 01/19/2013   HDL 49.60 01/19/2013   LDLCALC 57 01/19/2013   TRIG 65.0 01/19/2013   CHOLHDL 2 01/19/2013    No Known Allergies  Current Outpatient Prescriptions on File Prior to Visit  Medication Sig Dispense Refill  . amLODipine (NORVASC) 10 MG tablet TAKE ONE TABLET BY MOUTH ONCE DAILY 90 tablet 3  . Ascorbic Acid (VITAMIN C) 1000 MG tablet Take 1,500 mg by mouth daily.    . hydrochlorothiazide (MICROZIDE) 12.5 MG capsule TAKE ONE CAPSULE BY MOUTH ONCE DAILY 30 capsule 2  . KLOR-CON M10 10 MEQ tablet TAKE ONE TABLET BY MOUTH ONCE DAILY 90 tablet 0  . prednisoLONE acetate (PRED FORTE) 1 % ophthalmic suspension Place 1 drop into the right eye 2 (two) times daily. 5 mL 0  . VYTORIN 10-20 MG per tablet TAKE ONE TABLET BY MOUTH AT BEDTIME 30 tablet 5  . [DISCONTINUED] potassium chloride (K-DUR) 10 MEQ tablet Take 1 tablet (10 mEq total) by mouth daily. 30 tablet 11   No current facility-administered medications on file prior to visit.   Review of Systems    See HPI  Objective:    BP 120/78 mmHg  Pulse 83  Temp(Src) 97.7 F (36.5 C) (Oral)  Resp 18  Ht 5\' 4"  (1.626 m)  Wt 171 lb (77.565 kg)  BMI 29.34  kg/m2  SpO2 98% Nursing note and vital signs reviewed.  Physical Exam  Constitutional: She is oriented to person, place, and time. She appears well-developed and well-nourished. No distress.  Cardiovascular: Normal rate, regular rhythm, normal heart sounds and intact distal pulses.   Pulmonary/Chest: Effort normal and breath sounds normal.  Musculoskeletal:  Diabetic foot exam completed. Pulses and sensation are intact and appropriate. No evidence of skin damage or breakdown.   Neurological: She is alert and oriented to person, place, and time.  Skin: Skin is warm and dry.  Psychiatric: She has a normal mood and affect. Her behavior is normal. Judgment and thought content normal.       Assessment & Plan:

## 2013-11-23 ENCOUNTER — Encounter: Payer: Self-pay | Admitting: Family

## 2013-11-23 ENCOUNTER — Other Ambulatory Visit (INDEPENDENT_AMBULATORY_CARE_PROVIDER_SITE_OTHER): Payer: BC Managed Care – PPO

## 2013-11-23 ENCOUNTER — Other Ambulatory Visit: Payer: Self-pay | Admitting: Family

## 2013-11-23 DIAGNOSIS — E785 Hyperlipidemia, unspecified: Secondary | ICD-10-CM

## 2013-11-23 DIAGNOSIS — E119 Type 2 diabetes mellitus without complications: Secondary | ICD-10-CM

## 2013-11-23 LAB — LIPID PANEL
CHOLESTEROL: 210 mg/dL — AB (ref 0–200)
HDL: 43.4 mg/dL (ref >39.00)
LDL Cholesterol: 151 mg/dL — ABNORMAL HIGH (ref 0–99)
NonHDL: 166.6
TRIGLYCERIDES: 76 mg/dL (ref 0.0–149.0)
Total CHOL/HDL Ratio: 5
VLDL: 15.2 mg/dL (ref 0.0–40.0)

## 2013-11-23 LAB — HEMOGLOBIN A1C: Hgb A1c MFr Bld: 6.3 % (ref 4.6–6.5)

## 2013-11-23 LAB — BASIC METABOLIC PANEL
BUN: 15 mg/dL (ref 6–23)
CALCIUM: 9.1 mg/dL (ref 8.4–10.5)
CO2: 28 mEq/L (ref 19–32)
Chloride: 106 mEq/L (ref 96–112)
Creatinine, Ser: 0.7 mg/dL (ref 0.4–1.2)
GFR: 103.03 mL/min (ref >60.00)
Glucose, Bld: 112 mg/dL — ABNORMAL HIGH (ref 70–99)
POTASSIUM: 3.4 meq/L — AB (ref 3.5–5.1)
SODIUM: 142 meq/L (ref 135–145)

## 2013-11-23 MED ORDER — EZETIMIBE-SIMVASTATIN 10-20 MG PO TABS: ORAL_TABLET | ORAL | Status: DC

## 2014-01-20 ENCOUNTER — Ambulatory Visit
Admission: RE | Admit: 2014-01-20 | Discharge: 2014-01-20 | Disposition: A | Discharge status: Home / Self Care | Payer: BC Managed Care – PPO | Source: Ambulatory Visit

## 2014-01-20 DIAGNOSIS — Z1231 Encounter for screening mammogram for malignant neoplasm of breast: Secondary | ICD-10-CM

## 2014-01-24 ENCOUNTER — Ambulatory Visit (INDEPENDENT_AMBULATORY_CARE_PROVIDER_SITE_OTHER): Payer: BC Managed Care – PPO | Admitting: Internal Medicine

## 2014-01-24 ENCOUNTER — Telehealth: Payer: Self-pay | Admitting: Internal Medicine

## 2014-01-24 ENCOUNTER — Encounter: Payer: Self-pay | Admitting: Internal Medicine

## 2014-01-24 ENCOUNTER — Other Ambulatory Visit: Payer: Self-pay | Admitting: Geriatric Medicine

## 2014-01-24 VITALS — BP 122/78 | HR 62 | Temp 98.0°F | Resp 16 | Ht 64.0 in | Wt 171.0 lb

## 2014-01-24 DIAGNOSIS — I10 Essential (primary) hypertension: Secondary | ICD-10-CM

## 2014-01-24 DIAGNOSIS — R7301 Impaired fasting glucose: Secondary | ICD-10-CM

## 2014-01-24 DIAGNOSIS — E785 Hyperlipidemia, unspecified: Secondary | ICD-10-CM

## 2014-01-24 DIAGNOSIS — L989 Disorder of the skin and subcutaneous tissue, unspecified: Secondary | ICD-10-CM

## 2014-01-24 LAB — GLUCOSE, POCT (MANUAL RESULT ENTRY): POC Glucose: 78 mg/dl (ref 70–99)

## 2014-01-24 MED ORDER — POTASSIUM CHLORIDE CRYS ER 10 MEQ PO TBCR: 10.0000 meq | EXTENDED_RELEASE_TABLET | Freq: Every day | ORAL | Status: DC

## 2014-01-24 MED ORDER — EZETIMIBE-SIMVASTATIN 10-20 MG PO TABS
ORAL_TABLET | ORAL | Status: DC
Start: 2014-01-24 — End: 2014-02-23

## 2014-01-24 MED ORDER — HYDROCHLOROTHIAZIDE 12.5 MG PO CAPS: ORAL_CAPSULE | ORAL | Status: DC

## 2014-01-24 MED ORDER — AMLODIPINE BESYLATE 10 MG PO TABS: 10.0000 mg | ORAL_TABLET | Freq: Every day | ORAL | Status: DC

## 2014-01-24 NOTE — Progress Notes (Signed)
Pre visit review using our clinic review tool, if applicable. No additional management support is needed unless otherwise documented below in the visit note. 

## 2014-01-24 NOTE — Telephone Encounter (Signed)
Pt stated that she forgot to ask for a refill of : Amlodipine, Exetimibe, potassium and Hydrochlothiazide(this one pt is out), [facility]

## 2014-01-24 NOTE — Patient Instructions (Signed)
You are doing well overall. Keep up the good work with diet and exercise to keep you from getting full diabetes in the future.   We have checked your sugar level today and it is normal.   We will see you back in about 6 months so we can check on your HgA1c (this checks on your sugars).  Diabetes and Exercise Exercising regularly is important. It is not just about losing weight. It has many health benefits, such as:  Improving your overall fitness, flexibility, and endurance.  Increasing your bone density.  Helping with weight control.  Decreasing your body fat.  Increasing your muscle strength.  Reducing stress and tension.  Improving your overall health. People with diabetes who exercise gain additional benefits because exercise:  Reduces appetite.  Improves the body's use of blood sugar (glucose).  Helps lower or control blood glucose.  Decreases blood pressure.  Helps control blood lipids (such as cholesterol and triglycerides).  Improves the body's use of the hormone insulin by:  Increasing the body's insulin sensitivity.  Reducing the body's insulin needs.  Decreases the risk for heart disease because exercising:  Lowers cholesterol and triglycerides levels.  Increases the levels of good cholesterol (such as high-density lipoproteins [HDL]) in the body.  Lowers blood glucose levels. YOUR ACTIVITY PLAN  Choose an activity that you enjoy and set realistic goals. Your health care provider or diabetes educator can help you make an activity plan that works for you. Exercise regularly as directed by your health care provider. This includes:  Performing resistance training twice a week such as push-ups, sit-ups, lifting weights, or using resistance bands.  Performing 150 minutes of cardio exercises each week such as walking, running, or playing sports.  Staying active and spending no more than 90 minutes at one time being inactive. Even short bursts of exercise  are good for you. Three 10-minute sessions spread throughout the day are just as beneficial as a single 30-minute session. Some exercise ideas include:  Taking the dog for a walk.  Taking the stairs instead of the elevator.  Dancing to your favorite song.  Doing an exercise video.  Doing your favorite exercise with a friend. RECOMMENDATIONS FOR EXERCISING WITH TYPE 1 OR TYPE 2 DIABETES   Check your blood glucose before exercising. If blood glucose levels are greater than 240 mg/dL, check for urine ketones. Do not exercise if ketones are present.  Avoid injecting insulin into areas of the body that are going to be exercised. For example, avoid injecting insulin into:  The arms when playing tennis.  The legs when jogging.  Keep a record of:  Food intake before and after you exercise.  Expected peak times of insulin action.  Blood glucose levels before and after you exercise.  The type and amount of exercise you have done.  Review your records with your health care provider. Your health care provider will help you to develop guidelines for adjusting food intake and insulin amounts before and after exercising.  If you take insulin or oral hypoglycemic agents, watch for signs and symptoms of hypoglycemia. They include:  Dizziness.  Shaking.  Sweating.  Chills.  Confusion.  Drink plenty of water while you exercise to prevent dehydration or heat stroke. Body water is lost during exercise and must be replaced.  Talk to your health care provider before starting an exercise program to make sure it is safe for you. Remember, almost any type of activity is better than none. Document Released: 03/23/2003 Document  Revised: 05/17/2013 Document Reviewed: 06/09/2012 [facility] Patient Information 2015 [place], [place]. This information is not intended to replace advice given to you by your health care provider. Make sure you discuss any questions you have with your health care  provider.

## 2014-01-24 NOTE — Telephone Encounter (Signed)
Sent to pharmacy 

## 2014-01-25 NOTE — Assessment & Plan Note (Addendum)
Reviewed lipid panel. No adjustment needed at this time.

## 2014-01-25 NOTE — Assessment & Plan Note (Addendum)
Reviewed recent HgA1c and not on medication at this time. No problems with neuropathy etc. Repeat HgA1c every 6 months, glucose check today normal.

## 2014-01-25 NOTE — Assessment & Plan Note (Signed)
Still 2-3 mm tan lesion right parietal region. Does not warrant biopsy. Talked to her about the fact that if it bothers her she can go to dermatology for removal.

## 2014-01-25 NOTE — Progress Notes (Signed)
Subjective:    Patient ID: Anna Cooper, female    DOB: 31-Jul-1950, 64 y.o.   MRN: [id number]  HPI The patient is a 64 YO female who comes in today to establish care. She has PMH of depression, HTN, hyperlipidemia, impaired fasting blood sugar. She is doing well overall and wants to get a lesion on her scalp looked at. It has been there for more than 1 year and she is not sure if it has changed but doesn't think it is growing. She denies chest pains, SOB, GERD, joint pain or balance problems. She is doing well with her mood although she has been treated long ago for depression.   Review of Systems  Constitutional: Negative for fever, activity change, appetite change, fatigue and unexpected weight change.  HENT: Negative.   Respiratory: Negative for cough, chest tightness, shortness of breath and wheezing.   Cardiovascular: Negative for chest pain, palpitations and leg swelling.  Gastrointestinal: Negative for abdominal pain, diarrhea, constipation and abdominal distention.  Musculoskeletal: Negative.   Skin: Positive for color change.  Neurological: Negative.   Psychiatric/Behavioral: Negative.       Objective:   Physical Exam  Constitutional: She is oriented to person, place, and time. She appears well-developed and well-nourished.  HENT:  Head: Normocephalic and atraumatic.  Eyes: EOM are normal.  Neck: Normal range of motion.  Cardiovascular: Normal rate and regular rhythm.   Pulmonary/Chest: Effort normal and breath sounds normal. No respiratory distress. She has no wheezes. She has no rales.  Abdominal: Soft. Bowel sounds are normal.  Neurological: She is alert and oriented to person, place, and time. Coordination normal.  Skin: Skin is warm and dry.  2 mm tan lesion on parietal scalp right, unchanged from prior description Nov 2014   Filed Vitals:   01/24/14 1308  BP: 122/78  Pulse: 62  Temp: 98 F (36.7 C)  TempSrc: Oral  Resp: 16  Height: 5\' 4"  (1.626 m)    Weight: 171 lb (77.565 kg)  SpO2: 96%      Assessment & Plan:

## 2014-01-25 NOTE — Assessment & Plan Note (Addendum)
BP doing well on HCTZ and amlodipine. Reviewed BMP with normal kidney function.

## 2014-02-15 ENCOUNTER — Telehealth: Payer: Self-pay | Admitting: Internal Medicine

## 2014-02-15 NOTE — Telephone Encounter (Signed)
Patient went to pick up vytorin at pharmacy and cost had went up.  Pharmacy stated it could have been the way Dr. Dorise HissKollar wrote the script.  She would like this double checked.  Please advise.

## 2014-02-16 ENCOUNTER — Other Ambulatory Visit: Payer: Self-pay | Admitting: Geriatric Medicine

## 2014-02-16 DIAGNOSIS — E785 Hyperlipidemia, unspecified: Secondary | ICD-10-CM

## 2014-02-16 NOTE — Telephone Encounter (Signed)
Spoke to pharmacy. They said the problem has to do with the insurance company and the patient needs to call the insurance company.

## 2014-02-16 NOTE — Telephone Encounter (Signed)
I spoke with patient and she is calling her insurance company.

## 2014-02-23 ENCOUNTER — Other Ambulatory Visit: Payer: Self-pay | Admitting: Geriatric Medicine

## 2014-02-23 DIAGNOSIS — E785 Hyperlipidemia, unspecified: Secondary | ICD-10-CM

## 2014-02-23 MED ORDER — POTASSIUM CHLORIDE CRYS ER 10 MEQ PO TBCR: 10.0000 meq | EXTENDED_RELEASE_TABLET | Freq: Every day | ORAL | Status: DC

## 2014-02-23 MED ORDER — EZETIMIBE-SIMVASTATIN 10-20 MG PO TABS: ORAL_TABLET | ORAL | Status: DC

## 2014-02-23 MED ORDER — AMLODIPINE BESYLATE 10 MG PO TABS: 10.0000 mg | ORAL_TABLET | Freq: Every day | ORAL | Status: DC

## 2014-02-23 MED ORDER — HYDROCHLOROTHIAZIDE 12.5 MG PO CAPS: ORAL_CAPSULE | ORAL | Status: DC

## 2014-05-05 ENCOUNTER — Other Ambulatory Visit: Payer: Self-pay

## 2014-05-05 DIAGNOSIS — E785 Hyperlipidemia, unspecified: Secondary | ICD-10-CM

## 2014-05-05 MED ORDER — EZETIMIBE-SIMVASTATIN 10-20 MG PO TABS: 1.0000 | ORAL_TABLET | Freq: Once | ORAL | Status: DC

## 2014-05-05 MED ORDER — POTASSIUM CHLORIDE CRYS ER 10 MEQ PO TBCR: 10.0000 meq | EXTENDED_RELEASE_TABLET | Freq: Every day | ORAL | Status: DC

## 2014-05-05 MED ORDER — HYDROCHLOROTHIAZIDE 12.5 MG PO CAPS: ORAL_CAPSULE | ORAL | Status: DC

## 2014-05-05 MED ORDER — AMLODIPINE BESYLATE 10 MG PO TABS: 10.0000 mg | ORAL_TABLET | Freq: Every day | ORAL | Status: DC

## 2014-05-11 ENCOUNTER — Telehealth: Payer: Self-pay | Admitting: Internal Medicine

## 2014-05-11 NOTE — Telephone Encounter (Signed)
Pt came by and [facility], effective 04/15/14.  Per pt request, she wants future refills on her meds to be 90 qty.  [facility]

## 2014-05-11 NOTE — Telephone Encounter (Signed)
Pt does needs refills on all Rx's now.  [facility].  Thanks!

## 2014-05-12 ENCOUNTER — Other Ambulatory Visit: Payer: Self-pay | Admitting: Geriatric Medicine

## 2014-05-12 DIAGNOSIS — E785 Hyperlipidemia, unspecified: Secondary | ICD-10-CM

## 2014-05-12 MED ORDER — EZETIMIBE-SIMVASTATIN 10-20 MG PO TABS: 1.0000 | ORAL_TABLET | Freq: Once | ORAL | Status: DC

## 2014-05-12 MED ORDER — POTASSIUM CHLORIDE CRYS ER 10 MEQ PO TBCR: 10.0000 meq | EXTENDED_RELEASE_TABLET | Freq: Every day | ORAL | Status: DC

## 2014-05-12 MED ORDER — AMLODIPINE BESYLATE 10 MG PO TABS: 10.0000 mg | ORAL_TABLET | Freq: Every day | ORAL | Status: DC

## 2014-05-12 MED ORDER — HYDROCHLOROTHIAZIDE 12.5 MG PO CAPS: ORAL_CAPSULE | ORAL | Status: DC

## 2014-05-12 NOTE — Telephone Encounter (Signed)
Sent to [employer]

## 2014-05-19 ENCOUNTER — Other Ambulatory Visit: Payer: Self-pay | Admitting: Geriatric Medicine

## 2014-05-19 DIAGNOSIS — E785 Hyperlipidemia, unspecified: Secondary | ICD-10-CM

## 2014-05-19 MED ORDER — POTASSIUM CHLORIDE CRYS ER 10 MEQ PO TBCR
10.0000 meq | EXTENDED_RELEASE_TABLET | Freq: Every day | ORAL | Status: DC
Start: 2014-05-19 — End: 2015-09-28

## 2014-05-19 MED ORDER — AMLODIPINE BESYLATE 10 MG PO TABS: 10.0000 mg | ORAL_TABLET | Freq: Every day | ORAL | Status: DC

## 2014-05-19 MED ORDER — HYDROCHLOROTHIAZIDE 12.5 MG PO CAPS: ORAL_CAPSULE | ORAL | Status: DC

## 2014-05-19 MED ORDER — EZETIMIBE-SIMVASTATIN 10-20 MG PO TABS: 1.0000 | ORAL_TABLET | Freq: Once | ORAL | Status: DC

## 2014-07-25 ENCOUNTER — Other Ambulatory Visit (INDEPENDENT_AMBULATORY_CARE_PROVIDER_SITE_OTHER): Payer: Medicare PPO

## 2014-07-25 ENCOUNTER — Ambulatory Visit: Payer: BC Managed Care – PPO | Admitting: Internal Medicine

## 2014-07-25 ENCOUNTER — Encounter: Payer: Self-pay | Admitting: Internal Medicine

## 2014-07-25 ENCOUNTER — Ambulatory Visit (INDEPENDENT_AMBULATORY_CARE_PROVIDER_SITE_OTHER): Payer: Medicare PPO | Admitting: Internal Medicine

## 2014-07-25 VITALS — BP 108/72 | HR 70 | Temp 98.0°F | Resp 12 | Ht 64.0 in | Wt 175.0 lb

## 2014-07-25 DIAGNOSIS — L989 Disorder of the skin and subcutaneous tissue, unspecified: Secondary | ICD-10-CM

## 2014-07-25 DIAGNOSIS — R7301 Impaired fasting glucose: Secondary | ICD-10-CM

## 2014-07-25 DIAGNOSIS — I10 Essential (primary) hypertension: Secondary | ICD-10-CM

## 2014-07-25 LAB — BASIC METABOLIC PANEL
BUN: 11 mg/dL (ref 6–23)
CO2: 29 mEq/L (ref 19–32)
Calcium: 9.6 mg/dL (ref 8.4–10.5)
Chloride: 102 mEq/L (ref 96–112)
Creatinine, Ser: 0.79 mg/dL (ref 0.40–1.20)
GFR: 93.86 mL/min (ref >60.00)
Glucose, Bld: 136 mg/dL — ABNORMAL HIGH (ref 70–99)
Potassium: 3.4 mEq/L — ABNORMAL LOW (ref 3.5–5.1)
SODIUM: 140 meq/L (ref 135–145)

## 2014-07-25 LAB — HEMOGLOBIN A1C: HEMOGLOBIN A1C: 6.7 % — AB (ref 4.6–6.5)

## 2014-07-25 MED ORDER — VALACYCLOVIR HCL 1 G PO TABS: 1000.0000 mg | ORAL_TABLET | Freq: Two times a day (BID) | ORAL | Status: DC

## 2014-07-25 NOTE — Assessment & Plan Note (Signed)
Last HgA1c 6.3 and rechecking today. Not on medications for it. She is not exercising and is up about 4 pounds since last visit. Talked to her about the fact that this affects her sugars and could cause them to worsen.

## 2014-07-25 NOTE — Progress Notes (Signed)
Subjective:    Patient ID: Anna Cooper, female    DOB: October 31, 1950, 64 y.o.   MRN: [id number]  HPI The patient is a 64 YO female coming in for check on her sugars. She has not been working on exercise as we discussed at last visit. Has not been exercising the last several months. she is up about 4 pounds since last visit. Not taking any medicines for her sugars. Denies excessive thirst, increased urination, any change in vision. No side effects from medicines and no new complaints at today's visit.   Review of Systems  Constitutional: Negative for fever, activity change, appetite change, fatigue and unexpected weight change.  Respiratory: Negative for cough, chest tightness, shortness of breath and wheezing.   Cardiovascular: Negative for chest pain, palpitations and leg swelling.  Gastrointestinal: Negative for abdominal pain, diarrhea, constipation and abdominal distention.  Musculoskeletal: Negative.   Neurological: Negative.       Objective:   Physical Exam  Constitutional: She is oriented to person, place, and time. She appears well-developed and well-nourished.  HENT:  Head: Normocephalic and atraumatic.  Eyes: EOM are normal.  Neck: Normal range of motion.  Cardiovascular: Normal rate and regular rhythm.   Pulmonary/Chest: Effort normal and breath sounds normal. No respiratory distress. She has no wheezes. She has no rales.  Abdominal: Soft. Bowel sounds are normal.  Neurological: She is alert and oriented to person, place, and time. Coordination normal.  Skin: Skin is warm and dry.   Filed Vitals:   07/25/14 0806  BP: 108/72  Pulse: 70  Temp: 98 F (36.7 C)  TempSrc: Oral  Resp: 12  Height: 5\' 4"  (1.626 m)  Weight: 175 lb (79.379 kg)  SpO2: 97%      Assessment & Plan:

## 2014-07-25 NOTE — Patient Instructions (Signed)
We will check the blood work today and call you back with the results.   Think about getting back with the exercise to stay healthy.   We have sent in a medicine called valtrex which helps when you get a sore. Take 1 pill twice a day while you have the sore and the medicine helps it to hurt less and go away faster. Just take it when you have the sore.   Exercise to Stay Healthy Exercise helps you become and stay healthy. EXERCISE IDEAS AND TIPS Choose exercises that:  You enjoy.  Fit into your day. You do not need to exercise really hard to be healthy. You can do exercises at a slow or medium level and stay healthy. You can:  Stretch before and after working out.  Try yoga, Pilates, or tai chi.  Lift weights.  Walk fast, swim, jog, run, climb stairs, bicycle, dance, or rollerskate.  Take aerobic classes. Exercises that burn about 150 calories:  Running 1  miles in 15 minutes.  Playing volleyball for 45 to 60 minutes.  Washing and waxing a car for 45 to 60 minutes.  Playing touch football for 45 minutes.  Walking 1  miles in 35 minutes.  Pushing a stroller 1  miles in 30 minutes.  Playing basketball for 30 minutes.  Raking leaves for 30 minutes.  Bicycling 5 miles in 30 minutes.  Walking 2 miles in 30 minutes.  Dancing for 30 minutes.  Shoveling snow for 15 minutes.  Swimming laps for 20 minutes.  Walking up stairs for 15 minutes.  Bicycling 4 miles in 15 minutes.  Gardening for 30 to 45 minutes.  Jumping rope for 15 minutes.  Washing windows or floors for 45 to 60 minutes. Document Released: 02/02/2010 Document Revised: 03/25/2011 Document Reviewed: 02/02/2010 [facility] Patient Information 2015 [place], [place]. This information is not intended to replace advice given to you by your health care provider. Make sure you discuss any questions you have with your health care provider.

## 2014-07-25 NOTE — Assessment & Plan Note (Signed)
Unchanged but becoming irritated by brushing her hair. Refer to dermatology for removal.

## 2014-07-25 NOTE — Progress Notes (Signed)
Pre visit review using our clinic review tool, if applicable. No additional management support is needed unless otherwise documented below in the visit note. 

## 2014-07-25 NOTE — Assessment & Plan Note (Signed)
BP at goal, checking BMP today. Continue amlodipine and hctz unless indication for change.

## 2014-07-28 ENCOUNTER — Telehealth: Payer: Self-pay | Admitting: Internal Medicine

## 2014-07-28 NOTE — Telephone Encounter (Signed)
Done. Pt informed of results

## 2014-07-28 NOTE — Telephone Encounter (Signed)
Patient returning your call re: labs results.

## 2014-09-05 ENCOUNTER — Other Ambulatory Visit: Payer: Self-pay | Admitting: Dermatology

## 2014-10-31 ENCOUNTER — Ambulatory Visit (INDEPENDENT_AMBULATORY_CARE_PROVIDER_SITE_OTHER): Payer: Medicare PPO | Admitting: Internal Medicine

## 2014-10-31 ENCOUNTER — Other Ambulatory Visit (INDEPENDENT_AMBULATORY_CARE_PROVIDER_SITE_OTHER): Payer: Medicare PPO

## 2014-10-31 ENCOUNTER — Encounter: Payer: Self-pay | Admitting: Internal Medicine

## 2014-10-31 VITALS — BP 108/68 | HR 64 | Temp 98.0°F | Resp 16 | Ht 63.0 in | Wt 173.8 lb

## 2014-10-31 DIAGNOSIS — E119 Type 2 diabetes mellitus without complications: Secondary | ICD-10-CM

## 2014-10-31 DIAGNOSIS — I10 Essential (primary) hypertension: Secondary | ICD-10-CM

## 2014-10-31 LAB — LIPID PANEL
CHOLESTEROL: 126 mg/dL (ref 0–200)
HDL: 49 mg/dL (ref >39.00)
LDL Cholesterol: 58 mg/dL (ref 0–99)
NonHDL: 76.56
Total CHOL/HDL Ratio: 3
Triglycerides: 92 mg/dL (ref 0.0–149.0)
VLDL: 18.4 mg/dL (ref 0.0–40.0)

## 2014-10-31 LAB — BASIC METABOLIC PANEL
BUN: 9 mg/dL (ref 6–23)
CO2: 30 meq/L (ref 19–32)
Calcium: 9.6 mg/dL (ref 8.4–10.5)
Chloride: 104 mEq/L (ref 96–112)
Creatinine, Ser: 0.69 mg/dL (ref 0.40–1.20)
GFR: 109.63 mL/min (ref >60.00)
Glucose, Bld: 99 mg/dL (ref 70–99)
POTASSIUM: 3.4 meq/L — AB (ref 3.5–5.1)
Sodium: 141 mEq/L (ref 135–145)

## 2014-10-31 LAB — HEMOGLOBIN A1C: Hgb A1c MFr Bld: 6.6 % — ABNORMAL HIGH (ref 4.6–6.5)

## 2014-10-31 NOTE — Progress Notes (Signed)
Pre visit review using our clinic review tool, if applicable. No additional management support is needed unless otherwise documented below in the visit note. 

## 2014-10-31 NOTE — Assessment & Plan Note (Signed)
HgA1c last time was 6.7. She had not been aware that she had sugars in the past that qualified her as diabetic. She is not sure if she wants to start medicine for it now. Referral to medical nutrition for new diabetes teaching. Checking HgA1c today and if still higher than 6.5 we will start metformin for her rare burning pains to see if these are related to her sugars. She is not on ACE-I or ARB and at next visit can consider changing her amlodipine. She is continuing to work on weight loss. Educated on the need for yearly eye exam and standards of care for diabetics.

## 2014-10-31 NOTE — Patient Instructions (Signed)
We will have you go talk to a nutrition expert about the sugars and what foods are good and bad for the sugars and how to make some of the foods you love more healthy.   We will check the blood today for the sugars and call you back with the results. If it is about the same we may start a low dose medicine for the sugars to keep them good.   Diabetes and Standards of Medical Care Diabetes is complicated. You may find that your diabetes team includes a dietitian, nurse, diabetes educator, eye doctor, and more. To help everyone know what is going on and to help you get the care you deserve, the following schedule of care was developed to help keep you on track. Below are the tests, exams, vaccines, medicines, education, and plans you will need. HbA1c test This test shows how well you have controlled your glucose over the past 2-3 months. It is used to see if your diabetes management plan needs to be adjusted.   It is performed at least 2 times a year if you are meeting treatment goals.  It is performed 4 times a year if therapy has changed or if you are not meeting treatment goals. Blood pressure test  This test is performed at every routine medical visit. The goal is less than 140/90 mm Hg for most people, but 130/80 mm Hg in some cases. Ask your health care provider about your goal. Dental exam  Follow up with the dentist regularly. Eye exam  If you are diagnosed with type 1 diabetes as a child, get an exam upon reaching the age of 55 years or older and having had diabetes for 3-5 years. Yearly eye exams are recommended after that initial eye exam.  If you are diagnosed with type 1 diabetes as an adult, get an exam within 5 years of diagnosis and then yearly.  If you are diagnosed with type 2 diabetes, get an exam as soon as possible after the diagnosis and then yearly. Foot care exam  Visual foot exams are performed at every routine medical visit. The exams check for cuts, injuries, or  other problems with the feet.  You should have a complete foot exam performed every year. This exam includes an inspection of the structure and skin of your feet, a check of the pulses in your feet, and a check of the sensation in your feet.  Type 1 diabetes: The first exam is performed 5 years after diagnosis.  Type 2 diabetes: The first exam is performed at the time of diagnosis.  Check your feet nightly for cuts, injuries, or other problems with your feet. Tell your health care provider if anything is not healing. Kidney function test (urine microalbumin)  This test is performed once a year.  Type 1 diabetes: The first test is performed 5 years after diagnosis.  Type 2 diabetes: The first test is performed at the time of diagnosis.  A serum creatinine and estimated glomerular filtration rate (eGFR) test is done once a year to assess the level of chronic kidney disease (CKD), if present. Lipid profile (cholesterol, HDL, LDL, triglycerides)  Performed every 5 years for most people.  The goal for LDL is less than 100 mg/dL. If you are at high risk, the goal is less than 70 mg/dL.  The goal for HDL is 40 mg/dL-50 mg/dL for men and 50 mg/dL-60 mg/dL for women. An HDL cholesterol of 60 mg/dL or higher gives some protection against  heart disease.  The goal for triglycerides is less than 150 mg/dL. Immunizations  The flu (influenza) vaccine is recommended yearly for every person 93 months of age or older who has diabetes.  The pneumonia (pneumococcal) vaccine is recommended for every person 25 years of age or older who has diabetes. Adults 32 years of age or older may receive the pneumonia vaccine as a series of two separate shots.  The hepatitis B vaccine is recommended for adults shortly after they have been diagnosed with diabetes.  The Tdap (tetanus, diphtheria, and pertussis) vaccine should be given:  According to normal childhood vaccination schedules, for children.  Every 10  years, for adults who have diabetes. Diabetes self-management education  Education is recommended at diagnosis and ongoing as needed. Treatment plan  Your treatment plan is reviewed at every medical visit.   This information is not intended to replace advice given to you by your health care provider. Make sure you discuss any questions you have with your health care provider.   Document Released: 10/28/2008 Document Revised: 01/21/2014 Document Reviewed: 06/02/2012 Elsevier Interactive Patient Education [employer].

## 2014-10-31 NOTE — Assessment & Plan Note (Addendum)
BP well controlled and only indication for change would be new diagnosis of diabetes. Will address at next visit and talked to her today. Taking HCTZ and amlodipine currently.

## 2014-10-31 NOTE — Progress Notes (Signed)
Subjective:    Patient ID: Anna Cooper, female    DOB: December 29, 1950, 64 y.o.   MRN: [id number]  HPI The patient is a 64 YO female coming in for follow up of her sugars. Her HgA1c increased last visit. She was to be working on more exercise but has not been able to do that well. She is now newly diagnosed with diabetes (although she had sugars in the diabetic range in the past she was never told that). She has been trying to cut out sweets and has lost 1 pound in the last 3 months. Some mild burning in her feet at night time which she rates as rare. Has not tried anything for it.   Review of Systems  Constitutional: Negative for fever, activity change, appetite change, fatigue and unexpected weight change.  Respiratory: Negative for cough, chest tightness, shortness of breath and wheezing.   Cardiovascular: Negative for chest pain, palpitations and leg swelling.  Gastrointestinal: Negative for abdominal pain, diarrhea, constipation and abdominal distention.  Musculoskeletal: Negative.   Skin: Negative.   Neurological: Negative.   Psychiatric/Behavioral: Negative.       Objective:   Physical Exam  Constitutional: She is oriented to person, place, and time. She appears well-developed and well-nourished.  HENT:  Head: Normocephalic and atraumatic.  Eyes: EOM are normal.  Neck: Normal range of motion.  Cardiovascular: Normal rate and regular rhythm.   Pulmonary/Chest: Effort normal and breath sounds normal. No respiratory distress. She has no wheezes. She has no rales.  Abdominal: Soft. Bowel sounds are normal.  Musculoskeletal: She exhibits no edema.  Neurological: She is alert and oriented to person, place, and time. Coordination normal.  Skin: Skin is warm and dry.  Psychiatric: She has a normal mood and affect.   Filed Vitals:   10/31/14 1023  BP: 108/68  Pulse: 64  Temp: 98 F (36.7 C)  TempSrc: Oral  Resp: 16  Height: 5\' 3"  (1.6 m)  Weight: 173 lb 12.8 oz (78.835 kg)    SpO2: 96%      Assessment & Plan:

## 2014-11-15 ENCOUNTER — Encounter: Payer: Medicare PPO | Attending: Internal Medicine

## 2014-11-15 VITALS — Ht 63.5 in | Wt 170.4 lb

## 2014-11-15 DIAGNOSIS — E119 Type 2 diabetes mellitus without complications: Secondary | ICD-10-CM | POA: Diagnosis present

## 2014-11-15 DIAGNOSIS — Z713 Dietary counseling and surveillance: Secondary | ICD-10-CM | POA: Diagnosis not present

## 2014-11-15 NOTE — Progress Notes (Signed)

## 2014-11-16 ENCOUNTER — Other Ambulatory Visit: Payer: Self-pay

## 2014-11-16 DIAGNOSIS — Z1231 Encounter for screening mammogram for malignant neoplasm of breast: Secondary | ICD-10-CM

## 2014-11-22 DIAGNOSIS — E119 Type 2 diabetes mellitus without complications: Secondary | ICD-10-CM | POA: Diagnosis not present

## 2014-11-23 NOTE — Progress Notes (Signed)

## 2014-11-29 ENCOUNTER — Encounter: Payer: Self-pay | Admitting: Internal Medicine

## 2014-11-29 DIAGNOSIS — E119 Type 2 diabetes mellitus without complications: Secondary | ICD-10-CM | POA: Diagnosis not present

## 2014-12-01 NOTE — Progress Notes (Signed)
Patient was seen on 11/29/14 for the third of a series of three diabetes self-management courses at the [facility].   Anna Cooper. State the amount of activity recommended for healthy living . Describe activities suitable for individual needs . Identify ways to regularly incorporate activity into daily life . Identify barriers to activity and ways to over come these barriers  Identify diabetes medications being personally used and their primary action for lowering glucose and possible side effects . Describe role of stress on blood glucose and develop strategies to address psychosocial issues . Identify diabetes complications and ways to prevent them  Explain how to manage diabetes during illness . Evaluate success in meeting personal goal . Establish 2-3 goals that they will plan to diligently work on until they return for the  824-month follow-up visit  Goals:   I will count my carb choices at most meals and snacks  I will be active 60 minutes or more 5 times a week  Your patient has identified these potential barriers to change:  Motivation  Your patient has identified their diabetes self-care support plan as  [facility] Support Group On-line Resources Plan:  Attend Optional Core 4 in 4 months

## 2014-12-30 ENCOUNTER — Ambulatory Visit (INDEPENDENT_AMBULATORY_CARE_PROVIDER_SITE_OTHER): Payer: Medicare PPO | Admitting: Podiatry

## 2014-12-30 ENCOUNTER — Encounter: Payer: Self-pay | Admitting: Podiatry

## 2014-12-30 VITALS — BP 116/78 | HR 66 | Resp 12

## 2014-12-30 DIAGNOSIS — L84 Corns and callosities: Secondary | ICD-10-CM

## 2014-12-30 DIAGNOSIS — L600 Ingrowing nail: Secondary | ICD-10-CM

## 2014-12-30 DIAGNOSIS — E119 Type 2 diabetes mellitus without complications: Secondary | ICD-10-CM

## 2014-12-30 NOTE — Progress Notes (Signed)
Subjective:    Patient ID: Anna Cooper, female    DOB: 03-21-1950, 64 y.o.   MRN: [id number]  HPI 64 year old female presents the office or concerns of calluses to both big toes as well as possible ingrown toenails to both of her big toes as well. She states that she has pain of the transpedicular pressure in shoes. She has tried to trim the area without much relief. Denies any surrounding redness or red streaks. No drainage or pus. No other complaints at this time. No recent injury or trauma.  Review of Systems  Eyes: Positive for redness and itching.  Genitourinary: Positive for urgency and frequency.  Musculoskeletal: Positive for back pain and joint swelling.  Neurological: Positive for dizziness and numbness.       Objective:   Physical Exam General: AAO x3, NAD  Dermatological: Hyperkeratotic lesions bilateral hallux. Upon debridement no underlying ulceration, drainage or other signs of infection. There is mild incurvation of both the medial and lateral aspects of bilateral hallux toenail with mild to palpation the very distal aspect of the toenail. There is no edema, erythema, drainage or pus. No clinical signs of infection.  Vascular: Dorsalis Pedis artery and Posterior Tibial artery pedal pulses are 2/4 bilateral with immedate capillary fill time. Pedal hair growth present. No varicosities and no lower extremity edema present bilateral. There is no pain with calf compression, swelling, warmth, erythema.   Neruologic: Grossly intact via light touch bilateral. Vibratory intact via tuning fork bilateral. Protective threshold with Semmes Wienstein monofilament intact to all pedal sites bilateral. Patellar and Achilles deep tendon reflexes 2+ bilateral. No Babinski or clonus noted bilateral.   Musculoskeletal: No gross boney pedal deformities bilateral. No pain, crepitus, or limitation noted with foot and ankle range of motion bilateral. Muscular strength 5/5 in all groups tested  bilateral.  Gait: Unassisted, Nonantalgic.      Assessment & Plan:  64 year old female with symptomatic hyperkeratotic lesions, ingrown toenails -Treatment options discussed including all alternatives, risks, and complications -Etiology of symptoms were discussed -Hyperkeratotic lesions debrided 2 without complication/bleeding. -Toenail sharply debrided 2 without complications/bleeding. Discussed partial nail avulsions she wishes to hold off on that at this time. -Follow-up with PCP for other issues mentioned in ROS.   Ovid Curd, DPM

## 2014-12-30 NOTE — Patient Instructions (Signed)
Over the counter medication for toenail fungus is called fungi-nail  

## 2015-01-23 ENCOUNTER — Ambulatory Visit: Payer: Medicare PPO

## 2015-02-07 ENCOUNTER — Ambulatory Visit
Admission: RE | Admit: 2015-02-07 | Discharge: 2015-02-07 | Disposition: A | Discharge status: Home / Self Care | Payer: Medicare Other | Source: Ambulatory Visit

## 2015-02-07 DIAGNOSIS — Z1231 Encounter for screening mammogram for malignant neoplasm of breast: Secondary | ICD-10-CM

## 2015-02-08 ENCOUNTER — Ambulatory Visit: Payer: Medicare PPO

## 2015-03-03 ENCOUNTER — Encounter: Payer: Self-pay | Admitting: Podiatry

## 2015-03-03 ENCOUNTER — Ambulatory Visit (INDEPENDENT_AMBULATORY_CARE_PROVIDER_SITE_OTHER): Payer: Medicare Other | Admitting: Podiatry

## 2015-03-03 DIAGNOSIS — L600 Ingrowing nail: Secondary | ICD-10-CM | POA: Diagnosis not present

## 2015-03-03 DIAGNOSIS — L84 Corns and callosities: Secondary | ICD-10-CM

## 2015-03-03 DIAGNOSIS — E119 Type 2 diabetes mellitus without complications: Secondary | ICD-10-CM | POA: Diagnosis not present

## 2015-03-06 NOTE — Progress Notes (Signed)
Patient ID: Anna Cooper, female   DOB: [birth year], 65 y.o.   MRN: [id number]  Subjective: 65 year old female presents the office they for follow-up evaluation of calluses to both of her feet as well as ingrown toenails to both of her big toes. She denies any surrounding redness or drainage. She states that she is doing better however she has noticed recurrence. Denies any drainage or pus coming from around the calluses or the toenails. Denies any systemic complaints such as fevers, chills, nausea, vomiting. No acute changes since last appointment, and no other complaints at this time.   Objective: AAO x3, NAD DP/PT pulses palpable bilaterally, CRT less than 3 seconds Protective sensation intact with Dorann Ou monofilament Hyperkeratotic lesion bilateral hallux and second metatarsal. Upon debridement no underlying ulceration, drainage or other signs of infection. Bilateral hallux nails of both the medial lateral nail borders are mildly incurvated on the distal aspect and tenderness to palpation over the nails. There is no surrounding erythema or drainage. No areas of pinpoint bony tenderness or pain with vibratory sensation. MMT 5/5, ROM WNL. No edema, erythema, increase in warmth to bilateral lower extremities.  No open lesions or pre-ulcerative lesions.  No pain with calf compression, swelling, warmth, erythema  Assessment: Symptomatic ingrown toenails, hyperkeratotic lesions  Plan: -All treatment options discussed with the patient including all alternatives, risks, complications.  -Nails are braided 2 without complications or bleeding. Upon debridement there is resolution of symptoms of the ingrown toenail. -Hyperkeratotic lesions debrided 3 without complications or bleeding. -Patient encouraged to call the office with any questions, concerns, change in symptoms.   Ovid Curd, DPM

## 2015-03-14 ENCOUNTER — Telehealth: Payer: Self-pay

## 2015-03-14 NOTE — Telephone Encounter (Signed)
LVM for pt to call back as soon as possible.   RE: Flu Vaccine 

## 2015-03-14 NOTE — Telephone Encounter (Signed)
Record has been updated.

## 2015-03-14 NOTE — Telephone Encounter (Signed)
Pt called state she had the flu shot with our office back in 10/2014. Please check

## 2015-04-05 ENCOUNTER — Ambulatory Visit: Payer: Medicare PPO

## 2015-05-29 ENCOUNTER — Other Ambulatory Visit: Payer: Self-pay | Admitting: Internal Medicine

## 2015-06-09 ENCOUNTER — Ambulatory Visit: Payer: Medicare Other | Admitting: Podiatry

## 2015-08-23 ENCOUNTER — Telehealth: Payer: Self-pay | Admitting: Emergency Medicine

## 2015-08-23 ENCOUNTER — Other Ambulatory Visit: Payer: Self-pay | Admitting: Geriatric Medicine

## 2015-08-23 MED ORDER — VALACYCLOVIR HCL 1 G PO TABS: 1000.0000 mg | ORAL_TABLET | Freq: Two times a day (BID) | ORAL | 1 refills | Status: DC

## 2015-08-23 NOTE — Telephone Encounter (Signed)
Pt needs a prescription refill on valACYclovir (VALTREX) 1000 MG tablet. Pharmacy is [facility]. Please follow up thanks.

## 2015-08-23 NOTE — Telephone Encounter (Signed)
Sent to pharmacy 

## 2015-08-28 NOTE — Progress Notes (Signed)
Exercise (Minutes/Week): 300, Intensity: Moderate  Goals    . Exercise 150 minutes per week (moderate activity)          [facility]  Class  Silver sneaker  Walks prior to her class       Depression Screen PHQ 2/9 Scores 08/29/2015 12/01/2014 11/23/2014 11/15/2014 07/25/2014  PHQ - 2 Score 0 0 0 0 0    Fall Risk Fall Risk  08/29/2015 12/01/2014 11/23/2014 11/15/2014 07/25/2014  Falls in the past year? No No No No No    Cognitive Function: MMSE - Mini Mental State Exam 08/29/2015  Orientation to time 5  Orientation to Place 5  Registration 3  Attention/ Calculation 5  Recall 3  Language- name 2 objects 2  Language- repeat 1  Language- follow 3 step command 3  Language- read & follow direction 1  Write a sentence 1  Copy design 1  Total score 30    Screening Tests Health Maintenance  Topic Date Due  . Hepatitis C Screening  [birth year]  . URINE MICROALBUMIN  04/25/1959  . OPHTHALMOLOGY EXAM  02/11/2014  . DEXA SCAN  04/25/2014  . HEMOGLOBIN A1C  05/01/2015  . INFLUENZA VACCINE  08/15/2015  . FOOT  EXAM  10/31/2015  . MAMMOGRAM  02/07/2016  . COLONOSCOPY  07/02/2016  . PNA vac Low Risk Adult (2 of 2 - PPSV23) 11/05/2016  . TETANUS/TDAP  11/10/2021  . ZOSTAVAX  Completed      Plan:     Order for Dexa scan; to have at the [facility] where she had her mammogram which was completed this year  Ua today for microalbumin  [facility]   Did state she had one episode of pain in upper chest area; left and right; Stated it was a 7; no n, v or diaphoresis. Went away. Alerted to s/s of chest pain and to go to the ER if this happened again to rule out chest pain or evaluate with EKG and labs.   During the course of the visit, Anna Cooper was educated and counseled about the following appropriate screening and preventive services:   Vaccines to include Pneumoccal, Influenza, Hepatitis B, Td, Zostavax, HCV  Electrocardiogram  Cardiovascular disease screening  Colorectal cancer screening/ for 2018  Bone density screening- ordered today   Diabetes screening  Glaucoma screening/ eye exam planned for Dec   Mammography/ jan 2017   Nutrition counseling/ she has been to the Diabetes class at [facility];   Diet consistent with low sugar;   Smoking cessation counseling/ n/a   Patient Instructions (the written plan) were given to the patient.    Wynetta Fines, RN   08/29/2015  Exercise (Minutes/Week): 300, Intensity: Moderate  Goals    . Exercise 150 minutes per week (moderate activity)          [facility] Will try [facility]  Class  Silver sneaker  Walks prior to her class       Depression Screen PHQ 2/9 Scores 08/29/2015 12/01/2014 11/23/2014 11/15/2014 07/25/2014  PHQ - 2 Score 0 0 0 0 0    Fall Risk Fall Risk  08/29/2015 12/01/2014 11/23/2014 11/15/2014 07/25/2014  Falls in the past year? No No No No No    Cognitive Function: MMSE - Mini Mental State Exam 08/29/2015  Orientation to time 5  Orientation to Place 5  Registration 3  Attention/ Calculation 5  Recall 3  Language- name 2 objects 2  Language- repeat 1  Language- follow 3 step command 3  Language- read & follow direction 1  Write a sentence 1  Copy design 1  Total score 30    Screening Tests Health Maintenance  Topic Date Due  . Hepatitis C Screening  [birth year]  . URINE MICROALBUMIN  04/25/1959  . OPHTHALMOLOGY EXAM  02/11/2014  . DEXA SCAN  04/25/2014  . HEMOGLOBIN A1C  05/01/2015  . INFLUENZA VACCINE  08/15/2015  . FOOT  EXAM  10/31/2015  . MAMMOGRAM  02/07/2016  . COLONOSCOPY  07/02/2016  . PNA vac Low Risk Adult (2 of 2 - PPSV23) 11/05/2016  . TETANUS/TDAP  11/10/2021  . ZOSTAVAX  Completed      Plan:     Order for Dexa scan; to have at the Breast center where she had her mammogram which was completed this year  Ua today for microalbumin  [facility]   Did state she had one episode of pain in upper chest area; left and right; Stated it was a 7; no n, v or diaphoresis. Went away. Alerted to s/s of chest pain and to go to the ER if this happened again to rule out chest pain or evaluate with EKG and labs.   During the course of the visit, Anna Cooper was educated and counseled about the following appropriate screening and preventive services:   Vaccines to include Pneumoccal, Influenza, Hepatitis B, Td, Zostavax, HCV  Electrocardiogram  Cardiovascular disease screening  Colorectal cancer screening/ for 2018  Bone density screening- ordered today   Diabetes screening  Glaucoma screening/ eye exam planned for Dec   Mammography/ jan 2017   Nutrition counseling/ she has been to the Diabetes class at [facility];   Diet consistent with low sugar;   Smoking cessation counseling/ n/a   Patient Instructions (the written plan) were given to the patient.    Wynetta Fines, RN   08/29/2015  Take by mouth daily.   No facility-administered encounter medications on file as of 08/29/2015.     Allergies (verified) Review of patient's allergies indicates no known allergies.   History: Past Medical History:  Diagnosis Date  . Depression   . Diabetes mellitus without complication ([place])   . History of recurrent UTIs   . History of venereal warts   . Hyperlipidemia   . Hypertension   . Infantile paralysis    Past Surgical History:  Procedure Laterality Date  . BUNIONECTOMY WITH HAMMERTOE RECONSTRUCTION  7/13   7/13;  right foot, 11/13; left foot  . EYE SURGERY  09/24/11   right eye; cataract removed; corneal transplant  . TUBAL LIGATION     Family History  Problem Relation Age of Onset  . Cancer Mother     Breast, was in remission but had relapse with mets.  . Diabetes Mother   . Heart attack Father   . Seizures Father   . Heart disease Father     CAD/MI-fatal  . Cancer Sister     Breast Cancer  . Cancer Sister     breast, BRCA +  . Cancer Paternal Uncle     Prostate Cancer  . Cancer Maternal Grandfather     Lung Cancer  . Cancer Maternal Aunt     breast   Social History   Occupational History  . educator    Social History Main Topics  . Smoking status: Never Smoker  . Smokeless tobacco: Never Used  . Alcohol use No  . Drug use: No  . Sexual activity: Not Currently    Tobacco Counseling Counseling given: Yes   Activities of Daily Living In your present state of health, do you have any difficulty performing the following activities: 08/29/2015  Hearing? N  Vision? N  Difficulty concentrating or making decisions? N  Walking or climbing stairs? N  Dressing or bathing? N  Doing errands, shopping? N  Preparing Food and eating ? N  Using the Toilet? N  In the past six months, have you accidently leaked urine? N  Do you have problems with loss of bowel control? N  Managing your Medications? N  Managing your Finances? N  Housekeeping or managing your Housekeeping? N  Some recent data might be hidden    Immunizations and Health Maintenance Immunization History  Administered Date(s) Administered  . Influenza Split 11/06/2011  . Influenza,inj,Quad PF,36+ Mos 10/28/2012, 11/16/2013  . Influenza-Unspecified 10/15/2014  . Pneumococcal Conjugate-13 01/25/2013  . Pneumococcal Polysaccharide-23 11/06/2011  . Tdap 11/11/2011  . Zoster 11/11/2011   Health Maintenance Due  Topic Date Due  . Hepatitis C Screening  March 31, 1950  . URINE MICROALBUMIN  04/25/1959  . OPHTHALMOLOGY  EXAM  02/11/2014  . DEXA SCAN  04/25/2014  . HEMOGLOBIN A1C  05/01/2015  . INFLUENZA VACCINE  08/15/2015    Patient Care Team: Hoyt Koch, MD as PCP - General (Internal Medicine) Newt Minion, MD as Consulting Physician (Orthopedic Surgery) Marylynn Pearson, MD as Consulting Physician (Ophthalmology)  Indicate any recent Medical Services you may have received from other than [facility] providers in the past year (date may be approximate).     Assessment:   This is a routine wellness examination for Anna Cooper.  Educated regarding pre-diabetes and exercise   Hearing/Vision screen Hearing Screening Comments: Audiology checked very good   Dietary issues and exercise activities discussed: Current Exercise Habits: Structured exercise class, Type of exercise: yoga;walking;strength training/weights, Time (Minutes): 60, Frequency (Times/Week): 5, Weekly

## 2015-08-29 ENCOUNTER — Other Ambulatory Visit: Payer: Self-pay | Admitting: Internal Medicine

## 2015-08-29 ENCOUNTER — Ambulatory Visit (INDEPENDENT_AMBULATORY_CARE_PROVIDER_SITE_OTHER): Payer: Medicare Other

## 2015-08-29 ENCOUNTER — Other Ambulatory Visit: Payer: Medicare Other

## 2015-08-29 VITALS — BP 110/70 | HR 75 | Ht 64.5 in | Wt 172.5 lb

## 2015-08-29 DIAGNOSIS — Z0000 Encounter for general adult medical examination without abnormal findings: Secondary | ICD-10-CM | POA: Diagnosis not present

## 2015-08-29 DIAGNOSIS — Z780 Asymptomatic menopausal state: Secondary | ICD-10-CM

## 2015-08-29 DIAGNOSIS — Z7289 Other problems related to lifestyle: Secondary | ICD-10-CM | POA: Diagnosis not present

## 2015-08-29 DIAGNOSIS — E2839 Other primary ovarian failure: Secondary | ICD-10-CM

## 2015-08-29 NOTE — Progress Notes (Signed)
Medical screening examination/treatment/procedure(s) were performed by non-physician practitioner and as supervising physician I was immediately available for consultation/collaboration. I agree with above. Elizabeth A Crawford, MD 

## 2015-08-29 NOTE — Patient Instructions (Addendum)
Ms. Maung , Thank you for taking time to come for your Medicare Wellness Visit. I appreciate your ongoing commitment to your health goals. Please review the following plan we discussed and let me know if I can assist you in the future.   Will order Hep c for future blood draw in Sept Will get microalbumin today   Educated regarding prediabetes and numbers;  A1c ranges from 5.8 to 6.5 or fasting Blood sugar > 115 -126; (126 is diabetic)   Risk: >66yo; family hx; overweight or obese; African American; Hispanic; Latino; American [place]; [place] American; [place] Islander; history of diabetes when pregnant; or birth to a baby weighing over 9 lbs. Being less physically active than 30 minutes; 3 times a week;   Prevention; Losing a modest 7 to 8 lbs; If over 200 lbs; 10 to 14 lbs;  Choose healthier foods; colorful veggies; fish or lean meats; drinks water Reduce portion size Start exercising; 30 minutes of fast walking x 30 minutes per day/ 60 min for weight loss    [email or web address] Search for pre diabetes     These are the goals we discussed: Goals    . Exercise 150 minutes per week (moderate activity)          [facility]  Class  Silver sneaker  Walks prior to her class        This is a list of the screening recommended for you and due dates:  Health Maintenance  Topic Date Due  .  Hepatitis C: One time screening is recommended by Center for Disease Control  (CDC) for  adults born from [birth year] through 1966.   Sep 08, 1950  . Urine Protein Check  04/25/1959  . Eye exam for diabetics  02/11/2014  . DEXA scan (bone density measurement)  04/25/2014  . Hemoglobin A1C  05/01/2015  . Flu Shot  08/15/2015  . Complete foot exam   10/31/2015  . Mammogram  02/07/2016  . Colon Cancer Screening  07/02/2016  . Pneumonia vaccines (2 of 2 - PPSV23) 11/05/2016  . Tetanus Vaccine  11/10/2021  . Shingles Vaccine  Completed     Bone Densitometry Bone densitometry is an  imaging test that uses a special X-ray to measure the amount of calcium and other minerals in your bones (bone density). This test is also known as a bone mineral density test or dual-energy X-ray absorptiometry (DXA). The test can measure bone density at your hip and your spine. It is similar to having a regular X-ray. You may have this test to:  Diagnose a condition that causes weak or thin bones (osteoporosis).  Predict your risk of a broken bone (fracture).  Determine how well osteoporosis treatment is working. LET [facility] CARE PROVIDER KNOW ABOUT:  Any allergies you have.  All medicines you are taking, including vitamins, herbs, eye drops, creams, and over-the-counter medicines.  Previous problems you or members of your family have had with the use of anesthetics.  Any blood disorders you have.  Previous surgeries you have had.  Medical conditions you have.  Possibility of pregnancy.  Any other medical test you had within the previous 14 days that used contrast material. RISKS AND COMPLICATIONS Generally, this is a safe procedure. However, problems can occur and may include the following:  This test exposes you to a very small amount of radiation.  The risks of radiation exposure may be greater to unborn children. BEFORE THE PROCEDURE  Do not take  any calcium supplements for 24 hours before having the test. You can otherwise eat and drink what you usually do.  Take off all metal jewelry, eyeglasses, dental appliances, and any other metal objects. PROCEDURE  You may lie on an exam table. There will be an X-ray generator below you and an imaging device above you.  Other devices, such as boxes or braces, may be used to position your body properly for the scan.  You will need to lie still while the machine slowly scans your body.  The images will show up on a computer monitor. AFTER THE PROCEDURE You may need more testing at a later time.   This information is not  intended to replace advice given to you by your health care provider. Make sure you discuss any questions you have with your health care provider.   Document Released: 01/23/2004 Document Revised: 01/21/2014 Document Reviewed: 06/10/2013 Elsevier Interactive Patient Education 2016 [employer].  Health Maintenance, Female Adopting a healthy lifestyle and getting preventive care can go a long way to promote health and wellness. Talk with your health care provider about what schedule of regular examinations is right for you. This is a good chance for you to check in with your provider about disease prevention and staying healthy. In between checkups, there are plenty of things you can do on your own. Experts have done a lot of research about which lifestyle changes and preventive measures are most likely to keep you healthy. Ask your health care provider for more information. WEIGHT AND DIET  Eat a healthy diet  Be sure to include plenty of vegetables, fruits, low-fat dairy products, and lean protein.  Do not eat a lot of foods high in solid fats, added sugars, or salt.  Get regular exercise. This is one of the most important things you can do for your health.  Most adults should exercise for at least 150 minutes each week. The exercise should increase your heart rate and make you sweat (moderate-intensity exercise).  Most adults should also do strengthening exercises at least twice a week. This is in addition to the moderate-intensity exercise.  Maintain a healthy weight  Body mass index (BMI) is a measurement that can be used to identify possible weight problems. It estimates body fat based on height and weight. Your health care provider can help determine your BMI and help you achieve or maintain a healthy weight.  For females 51 years of age and older:   A BMI below 18.5 is considered underweight.  A BMI of 18.5 to 24.9 is normal.  A BMI of 25 to 29.9 is considered overweight.  A  BMI of 30 and above is considered obese.  Watch levels of cholesterol and blood lipids  You should start having your blood tested for lipids and cholesterol at 66 years of age, then have this test every 5 years.  You may need to have your cholesterol levels checked more often if:  Your lipid or cholesterol levels are high.  You are older than 66 years of age.  You are at high risk for heart disease.  CANCER SCREENING   Lung Cancer  Lung cancer screening is recommended for adults 52-11 years old who are at high risk for lung cancer because of a history of smoking.  A yearly low-dose CT scan of the lungs is recommended for people who:  Currently smoke.  Have quit within the past 15 years.  Have at least a 30-pack-year history of smoking. A  pack year is smoking an average of one pack of cigarettes a day for 1 year.  Yearly screening should continue until it has been 15 years since you quit.  Yearly screening should stop if you develop a health problem that would prevent you from having lung cancer treatment.  Breast Cancer  Practice breast self-awareness. This means understanding how your breasts normally appear and feel.  It also means doing regular breast self-exams. Let your health care provider know about any changes, no matter how small.  If you are in your 20s or 30s, you should have a clinical breast exam (CBE) by a health care provider every 1-3 years as part of a regular health exam.  If you are 63 or older, have a CBE every year. Also consider having a breast X-ray (mammogram) every year.  If you have a family history of breast cancer, talk to your health care provider about genetic screening.  If you are at high risk for breast cancer, talk to your health care provider about having an MRI and a mammogram every year.  Breast cancer gene (BRCA) assessment is recommended for women who have family members with BRCA-related cancers. BRCA-related cancers  include:  Breast.  Ovarian.  Tubal.  Peritoneal cancers.  Results of the assessment will determine the need for genetic counseling and BRCA1 and BRCA2 testing. Cervical Cancer Your health care provider may recommend that you be screened regularly for cancer of the pelvic organs (ovaries, uterus, and vagina). This screening involves a pelvic examination, including checking for microscopic changes to the surface of your cervix (Pap test). You may be encouraged to have this screening done every 3 years, beginning at age 58.  For women ages 17-65, health care providers may recommend pelvic exams and Pap testing every 3 years, or they may recommend the Pap and pelvic exam, combined with testing for human papilloma virus (HPV), every 5 years. Some types of HPV increase your risk of cervical cancer. Testing for HPV may also be done on women of any age with unclear Pap test results.  Other health care providers may not recommend any screening for nonpregnant women who are considered low risk for pelvic cancer and who do not have symptoms. Ask your health care provider if a screening pelvic exam is right for you.  If you have had past treatment for cervical cancer or a condition that could lead to cancer, you need Pap tests and screening for cancer for at least 20 years after your treatment. If Pap tests have been discontinued, your risk factors (such as having a new sexual partner) need to be reassessed to determine if screening should resume. Some women have medical problems that increase the chance of getting cervical cancer. In these cases, your health care provider may recommend more frequent screening and Pap tests. Colorectal Cancer  This type of cancer can be detected and often prevented.  Routine colorectal cancer screening usually begins at 66 years of age and continues through 66 years of age.  Your health care provider may recommend screening at an earlier age if you have risk factors for  colon cancer.  Your health care provider may also recommend using home test kits to check for hidden blood in the stool.  A small camera at the end of a tube can be used to examine your colon directly (sigmoidoscopy or colonoscopy). This is done to check for the earliest forms of colorectal cancer.  Routine screening usually begins at age 67.  Direct  examination of the colon should be repeated every 5-10 years through 66 years of age. However, you may need to be screened more often if early forms of precancerous polyps or small growths are found. Skin Cancer  Check your skin from head to toe regularly.  Tell your health care provider about any new moles or changes in moles, especially if there is a change in a mole's shape or color.  Also tell your health care provider if you have a mole that is larger than the size of a pencil eraser.  Always use sunscreen. Apply sunscreen liberally and repeatedly throughout the day.  Protect yourself by wearing long sleeves, pants, a wide-brimmed hat, and sunglasses whenever you are outside. HEART DISEASE, DIABETES, AND HIGH BLOOD PRESSURE   High blood pressure causes heart disease and increases the risk of stroke. High blood pressure is more likely to develop in:  People who have blood pressure in the high end of the normal range (130-139/85-89 mm Hg).  People who are overweight or obese.  People who are African American.  If you are 68-89 years of age, have your blood pressure checked every 3-5 years. If you are 39 years of age or older, have your blood pressure checked every year. You should have your blood pressure measured twice--once when you are at a hospital or clinic, and once when you are not at a hospital or clinic. Record the average of the two measurements. To check your blood pressure when you are not at a hospital or clinic, you can use:  An automated blood pressure machine at a pharmacy.  A home blood pressure monitor.  If you  are between 82 years and 70 years old, ask your health care provider if you should take aspirin to prevent strokes.  Have regular diabetes screenings. This involves taking a blood sample to check your fasting blood sugar level.  If you are at a normal weight and have a low risk for diabetes, have this test once every three years after 66 years of age.  If you are overweight and have a high risk for diabetes, consider being tested at a younger age or more often. PREVENTING INFECTION  Hepatitis B  If you have a higher risk for hepatitis B, you should be screened for this virus. You are considered at high risk for hepatitis B if:  You were born in a country where hepatitis B is common. Ask your health care provider which countries are considered high risk.  Your parents were born in a high-risk country, and you have not been immunized against hepatitis B (hepatitis B vaccine).  You have HIV or AIDS.  You use needles to inject street drugs.  You live with someone who has hepatitis B.  You have had sex with someone who has hepatitis B.  You get hemodialysis treatment.  You take certain medicines for conditions, including cancer, organ transplantation, and autoimmune conditions. Hepatitis C  Blood testing is recommended for:  Everyone born from [birth year] through 1966.  Anyone with known risk factors for hepatitis C. Sexually transmitted infections (STIs)  You should be screened for sexually transmitted infections (STIs) including gonorrhea and chlamydia if:  You are sexually active and are younger than 66 years of age.  You are older than 66 years of age and your health care provider tells you that you are at risk for this type of infection.  Your sexual activity has changed since you were last screened and you are at an  increased risk for chlamydia or gonorrhea. Ask your health care provider if you are at risk.  If you do not have HIV, but are at risk, it may be recommended that you  take a prescription medicine daily to prevent HIV infection. This is called pre-exposure prophylaxis (PrEP). You are considered at risk if:  You are sexually active and do not regularly use condoms or know the HIV status of your partner(s).  You take drugs by injection.  You are sexually active with a partner who has HIV. Talk with your health care provider about whether you are at high risk of being infected with HIV. If you choose to begin PrEP, you should first be tested for HIV. You should then be tested every 3 months for as long as you are taking PrEP.  PREGNANCY   If you are premenopausal and you may become pregnant, ask your health care provider about preconception counseling.  If you may become pregnant, take 400 to 800 micrograms (mcg) of folic acid every day.  If you want to prevent pregnancy, talk to your health care provider about birth control (contraception). OSTEOPOROSIS AND MENOPAUSE   Osteoporosis is a disease in which the bones lose minerals and strength with aging. This can result in serious bone fractures. Your risk for osteoporosis can be identified using a bone density scan.  If you are 19 years of age or older, or if you are at risk for osteoporosis and fractures, ask your health care provider if you should be screened.  Ask your health care provider whether you should take a calcium or vitamin D supplement to lower your risk for osteoporosis.  Menopause may have certain physical symptoms and risks.  Hormone replacement therapy may reduce some of these symptoms and risks. Talk to your health care provider about whether hormone replacement therapy is right for you.  HOME CARE INSTRUCTIONS   Schedule regular health, dental, and eye exams.  Stay current with your immunizations.   Do not use any tobacco products including cigarettes, chewing tobacco, or electronic cigarettes.  If you are pregnant, do not drink alcohol.  If you are breastfeeding, limit how  much and how often you drink alcohol.  Limit alcohol intake to no more than 1 drink per day for nonpregnant women. One drink equals 12 ounces of beer, 5 ounces of wine, or 1 ounces of hard liquor.  Do not use street drugs.  Do not share needles.  Ask your health care provider for help if you need support or information about quitting drugs.  Tell your health care provider if you often feel depressed.  Tell your health care provider if you have ever been abused or do not feel safe at home.   This information is not intended to replace advice given to you by your health care provider. Make sure you discuss any questions you have with your health care provider.   Document Released: 07/16/2010 Document Revised: 01/21/2014 Document Reviewed: 12/02/2012 Elsevier Interactive Patient Education [employer]

## 2015-08-31 ENCOUNTER — Other Ambulatory Visit: Payer: Medicare Other

## 2015-08-31 ENCOUNTER — Other Ambulatory Visit: Payer: Self-pay | Admitting: Internal Medicine

## 2015-08-31 DIAGNOSIS — E1169 Type 2 diabetes mellitus with other specified complication: Secondary | ICD-10-CM

## 2015-08-31 DIAGNOSIS — E119 Type 2 diabetes mellitus without complications: Secondary | ICD-10-CM

## 2015-08-31 DIAGNOSIS — E669 Obesity, unspecified: Secondary | ICD-10-CM | POA: Diagnosis not present

## 2015-08-31 LAB — MICROALBUMIN / CREATININE URINE RATIO
Creatinine,U: 134.3 mg/dL
MICROALB/CREAT RATIO: 0.5 mg/g (ref 0.0–30.0)

## 2015-09-14 ENCOUNTER — Ambulatory Visit
Admission: RE | Admit: 2015-09-14 | Discharge: 2015-09-14 | Disposition: A | Discharge status: Home / Self Care | Payer: Medicare Other | Source: Ambulatory Visit | Attending: Internal Medicine | Admitting: Internal Medicine

## 2015-09-14 DIAGNOSIS — E2839 Other primary ovarian failure: Secondary | ICD-10-CM

## 2015-09-28 ENCOUNTER — Encounter: Payer: Self-pay | Admitting: Internal Medicine

## 2015-09-28 ENCOUNTER — Ambulatory Visit (INDEPENDENT_AMBULATORY_CARE_PROVIDER_SITE_OTHER): Payer: Medicare Other | Admitting: Internal Medicine

## 2015-09-28 ENCOUNTER — Other Ambulatory Visit (INDEPENDENT_AMBULATORY_CARE_PROVIDER_SITE_OTHER): Payer: Medicare Other

## 2015-09-28 VITALS — BP 124/84 | HR 81 | Temp 98.3°F | Resp 16 | Ht 64.0 in | Wt 176.0 lb

## 2015-09-28 DIAGNOSIS — I10 Essential (primary) hypertension: Secondary | ICD-10-CM

## 2015-09-28 DIAGNOSIS — Z1159 Encounter for screening for other viral diseases: Secondary | ICD-10-CM

## 2015-09-28 DIAGNOSIS — E119 Type 2 diabetes mellitus without complications: Secondary | ICD-10-CM

## 2015-09-28 DIAGNOSIS — Z23 Encounter for immunization: Secondary | ICD-10-CM

## 2015-09-28 DIAGNOSIS — E785 Hyperlipidemia, unspecified: Secondary | ICD-10-CM

## 2015-09-28 DIAGNOSIS — Z0000 Encounter for general adult medical examination without abnormal findings: Secondary | ICD-10-CM | POA: Diagnosis not present

## 2015-09-28 LAB — COMPREHENSIVE METABOLIC PANEL
ALT: 21 U/L (ref 0–35)
AST: 19 U/L (ref 0–37)
Albumin: 4.2 g/dL (ref 3.5–5.2)
Alkaline Phosphatase: 98 U/L (ref 39–117)
BUN: 10 mg/dL (ref 6–23)
CHLORIDE: 104 meq/L (ref 96–112)
CO2: 30 mEq/L (ref 19–32)
Calcium: 9.6 mg/dL (ref 8.4–10.5)
Creatinine, Ser: 0.76 mg/dL (ref 0.40–1.20)
GFR: 97.79 mL/min (ref >60.00)
GLUCOSE: 95 mg/dL (ref 70–99)
POTASSIUM: 3.4 meq/L — AB (ref 3.5–5.1)
SODIUM: 141 meq/L (ref 135–145)
Total Bilirubin: 0.3 mg/dL (ref 0.2–1.2)
Total Protein: 7.6 g/dL (ref 6.0–8.3)

## 2015-09-28 LAB — LIPID PANEL
CHOL/HDL RATIO: 3
Cholesterol: 128 mg/dL (ref 0–200)
HDL: 46.4 mg/dL (ref >39.00)
LDL CALC: 58 mg/dL (ref 0–99)
NONHDL: 81.52
Triglycerides: 118 mg/dL (ref 0.0–149.0)
VLDL: 23.6 mg/dL (ref 0.0–40.0)

## 2015-09-28 LAB — HEMOGLOBIN A1C: HEMOGLOBIN A1C: 6.7 % — AB (ref 4.6–6.5)

## 2015-09-28 NOTE — Assessment & Plan Note (Signed)
Shingles and tdap up to date. Given flu shot today. Pneumonia up to date. Colonoscopy due in 2018. Counseled on sun safety and mole surveillance. Counseled on the dangers of distracted driving. Given 10 year screening recommendations.

## 2015-09-28 NOTE — Patient Instructions (Addendum)
We will check the labs today and call you back with the results.  Health Maintenance, Female Adopting a healthy lifestyle and getting preventive care can go a long way to promote health and wellness. Talk with your health care provider about what schedule of regular examinations is right for you. This is a good chance for you to check in with your provider about disease prevention and staying healthy. In between checkups, there are plenty of things you can do on your own. Experts have done a lot of research about which lifestyle changes and preventive measures are most likely to keep you healthy. Ask your health care provider for more information. WEIGHT AND DIET  Eat a healthy diet  Be sure to include plenty of vegetables, fruits, low-fat dairy products, and lean protein.  Do not eat a lot of foods high in solid fats, added sugars, or salt.  Get regular exercise. This is one of the most important things you can do for your health.  Most adults should exercise for at least 150 minutes each week. The exercise should increase your heart rate and make you sweat (moderate-intensity exercise).  Most adults should also do strengthening exercises at least twice a week. This is in addition to the moderate-intensity exercise.  Maintain a healthy weight  Body mass index (BMI) is a measurement that can be used to identify possible weight problems. It estimates body fat based on height and weight. Your health care provider can help determine your BMI and help you achieve or maintain a healthy weight.  For females 20 years of age and older:   A BMI below 18.5 is considered underweight.  A BMI of 18.5 to 24.9 is normal.  A BMI of 25 to 29.9 is considered overweight.  A BMI of 30 and above is considered obese.  Watch levels of cholesterol and blood lipids  You should start having your blood tested for lipids and cholesterol at 66 years of age, then have this test every 5 years.  You may need to  have your cholesterol levels checked more often if:  Your lipid or cholesterol levels are high.  You are older than 66 years of age.  You are at high risk for heart disease.  CANCER SCREENING   Lung Cancer  Lung cancer screening is recommended for adults 55-80 years old who are at high risk for lung cancer because of a history of smoking.  A yearly low-dose CT scan of the lungs is recommended for people who:  Currently smoke.  Have quit within the past 15 years.  Have at least a 30-pack-year history of smoking. A pack year is smoking an average of one pack of cigarettes a day for 1 year.  Yearly screening should continue until it has been 15 years since you quit.  Yearly screening should stop if you develop a health problem that would prevent you from having lung cancer treatment.  Breast Cancer  Practice breast self-awareness. This means understanding how your breasts normally appear and feel.  It also means doing regular breast self-exams. Let your health care provider know about any changes, no matter how small.  If you are in your 20s or 30s, you should have a clinical breast exam (CBE) by a health care provider every 1-3 years as part of a regular health exam.  If you are 40 or older, have a CBE every year. Also consider having a breast X-ray (mammogram) every year.  If you have a family history of   of breast cancer, talk to your health care provider about genetic screening.  If you are at high risk for breast cancer, talk to your health care provider about having an MRI and a mammogram every year.  Breast cancer gene (BRCA) assessment is recommended for women who have family members with BRCA-related cancers. BRCA-related cancers include:  Breast.  Ovarian.  Tubal.  Peritoneal cancers.  Results of the assessment will determine the need for genetic counseling and BRCA1 and BRCA2 testing. Cervical Cancer Your health care provider may recommend that you be  screened regularly for cancer of the pelvic organs (ovaries, uterus, and vagina). This screening involves a pelvic examination, including checking for microscopic changes to the surface of your cervix (Pap test). You may be encouraged to have this screening done every 3 years, beginning at age 39.  For women ages 39-65, health care providers may recommend pelvic exams and Pap testing every 3 years, or they may recommend the Pap and pelvic exam, combined with testing for human papilloma virus (HPV), every 5 years. Some types of HPV increase your risk of cervical cancer. Testing for HPV may also be done on women of any age with unclear Pap test results.  Other health care providers may not recommend any screening for nonpregnant women who are considered low risk for pelvic cancer and who do not have symptoms. Ask your health care provider if a screening pelvic exam is right for you.  If you have had past treatment for cervical cancer or a condition that could lead to cancer, you need Pap tests and screening for cancer for at least 20 years after your treatment. If Pap tests have been discontinued, your risk factors (such as having a new sexual partner) need to be reassessed to determine if screening should resume. Some women have medical problems that increase the chance of getting cervical cancer. In these cases, your health care provider may recommend more frequent screening and Pap tests. Colorectal Cancer  This type of cancer can be detected and often prevented.  Routine colorectal cancer screening usually begins at 66 years of age and continues through 66 years of age.  Your health care provider may recommend screening at an earlier age if you have risk factors for colon cancer.  Your health care provider may also recommend using home test kits to check for hidden blood in the stool.  A small camera at the end of a tube can be used to examine your colon directly (sigmoidoscopy or colonoscopy).  This is done to check for the earliest forms of colorectal cancer.  Routine screening usually begins at age 70.  Direct examination of the colon should be repeated every 5-10 years through 66 years of age. However, you may need to be screened more often if early forms of precancerous polyps or small growths are found. Skin Cancer  Check your skin from head to toe regularly.  Tell your health care provider about any new moles or changes in moles, especially if there is a change in a mole's shape or color.  Also tell your health care provider if you have a mole that is larger than the size of a pencil eraser.  Always use sunscreen. Apply sunscreen liberally and repeatedly throughout the day.  Protect yourself by wearing long sleeves, pants, a wide-brimmed hat, and sunglasses whenever you are outside. HEART DISEASE, DIABETES, AND HIGH BLOOD PRESSURE   High blood pressure causes heart disease and increases the risk of stroke. High blood pressure is  more likely to develop in:  People who have blood pressure in the high end of the normal range (130-139/85-89 mm Hg).  People who are overweight or obese.  People who are African American.  If you are 11-85 years of age, have your blood pressure checked every 3-5 years. If you are 84 years of age or older, have your blood pressure checked every year. You should have your blood pressure measured twice--once when you are at a hospital or clinic, and once when you are not at a hospital or clinic. Record the average of the two measurements. To check your blood pressure when you are not at a hospital or clinic, you can use:  An automated blood pressure machine at a pharmacy.  A home blood pressure monitor.  If you are between 23 years and 12 years old, ask your health care provider if you should take aspirin to prevent strokes.  Have regular diabetes screenings. This involves taking a blood sample to check your fasting blood sugar level.  If you  are at a normal weight and have a low risk for diabetes, have this test once every three years after 66 years of age.  If you are overweight and have a high risk for diabetes, consider being tested at a younger age or more often. PREVENTING INFECTION  Hepatitis B  If you have a higher risk for hepatitis B, you should be screened for this virus. You are considered at high risk for hepatitis B if:  You were born in a country where hepatitis B is common. Ask your health care provider which countries are considered high risk.  Your parents were born in a high-risk country, and you have not been immunized against hepatitis B (hepatitis B vaccine).  You have HIV or AIDS.  You use needles to inject street drugs.  You live with someone who has hepatitis B.  You have had sex with someone who has hepatitis B.  You get hemodialysis treatment.  You take certain medicines for conditions, including cancer, organ transplantation, and autoimmune conditions. Hepatitis C  Blood testing is recommended for:  Everyone born from [birth year] through 1966.  Anyone with known risk factors for hepatitis C. Sexually transmitted infections (STIs)  You should be screened for sexually transmitted infections (STIs) including gonorrhea and chlamydia if:  You are sexually active and are younger than 66 years of age.  You are older than 66 years of age and your health care provider tells you that you are at risk for this type of infection.  Your sexual activity has changed since you were last screened and you are at an increased risk for chlamydia or gonorrhea. Ask your health care provider if you are at risk.  If you do not have HIV, but are at risk, it may be recommended that you take a prescription medicine daily to prevent HIV infection. This is called pre-exposure prophylaxis (PrEP). You are considered at risk if:  You are sexually active and do not regularly use condoms or know the HIV status of your  partner(s).  You take drugs by injection.  You are sexually active with a partner who has HIV. Talk with your health care provider about whether you are at high risk of being infected with HIV. If you choose to begin PrEP, you should first be tested for HIV. You should then be tested every 3 months for as long as you are taking PrEP.  PREGNANCY   If you are premenopausal and you  may become pregnant, ask your health care provider about preconception counseling.  If you may become pregnant, take 400 to 800 micrograms (mcg) of folic acid every day.  If you want to prevent pregnancy, talk to your health care provider about birth control (contraception). OSTEOPOROSIS AND MENOPAUSE   Osteoporosis is a disease in which the bones lose minerals and strength with aging. This can result in serious bone fractures. Your risk for osteoporosis can be identified using a bone density scan.  If you are 75 years of age or older, or if you are at risk for osteoporosis and fractures, ask your health care provider if you should be screened.  Ask your health care provider whether you should take a calcium or vitamin D supplement to lower your risk for osteoporosis.  Menopause may have certain physical symptoms and risks.  Hormone replacement therapy may reduce some of these symptoms and risks. Talk to your health care provider about whether hormone replacement therapy is right for you.  HOME CARE INSTRUCTIONS   Schedule regular health, dental, and eye exams.  Stay current with your immunizations.   Do not use any tobacco products including cigarettes, chewing tobacco, or electronic cigarettes.  If you are pregnant, do not drink alcohol.  If you are breastfeeding, limit how much and how often you drink alcohol.  Limit alcohol intake to no more than 1 drink per day for nonpregnant women. One drink equals 12 ounces of beer, 5 ounces of wine, or 1 ounces of hard liquor.  Do not use street drugs.  Do  not share needles.  Ask your health care provider for help if you need support or information about quitting drugs.  Tell your health care provider if you often feel depressed.  Tell your health care provider if you have ever been abused or do not feel safe at home.   This information is not intended to replace advice given to you by your health care provider. Make sure you discuss any questions you have with your health care provider.   Document Released: 07/16/2010 Document Revised: 01/21/2014 Document Reviewed: 12/02/2012 Elsevier Interactive Patient Education [employer].

## 2015-09-28 NOTE — Assessment & Plan Note (Signed)
Controlled on vytorin, checking lipid panel and adjust as needed.

## 2015-09-28 NOTE — Progress Notes (Signed)
Pre visit review using our clinic review tool, if applicable. No additional management support is needed unless otherwise documented below in the visit note. 

## 2015-09-28 NOTE — Assessment & Plan Note (Signed)
BP at goal on her amlodipine and hctz. Checking CMP and adjust as needed.

## 2015-09-28 NOTE — Assessment & Plan Note (Signed)
Managed by diet, checking HgA1c, foot exam done today.

## 2015-09-28 NOTE — Progress Notes (Signed)
Subjective:    Patient ID: Anna Cooper, female    DOB: October 13, 1950, 65 y.o.   MRN: [id number]  HPI The patient is a 65 YO female coming in for CPE. No new complaints. She is working on exercising by going to the gym 3 times per week.   PMH, [facility], social history reviewed and updated.   Review of Systems  Constitutional: Negative for activity change, appetite change, fatigue, fever and unexpected weight change.  HENT: Negative.   Eyes: Negative.   Respiratory: Negative for cough, chest tightness, shortness of breath and wheezing.   Cardiovascular: Negative for chest pain, palpitations and leg swelling.  Gastrointestinal: Negative for abdominal distention, abdominal pain, constipation and diarrhea.  Musculoskeletal: Negative.   Skin: Negative.   Neurological: Negative.   Psychiatric/Behavioral: Negative.       Objective:   Physical Exam  Constitutional: She is oriented to person, place, and time. She appears well-developed and well-nourished.  HENT:  Head: Normocephalic and atraumatic.  Eyes: EOM are normal.  Neck: Normal range of motion.  Cardiovascular: Normal rate and regular rhythm.   Pulmonary/Chest: Effort normal and breath sounds normal. No respiratory distress. She has no wheezes. She has no rales.  Abdominal: Soft. Bowel sounds are normal. She exhibits no distension. There is no tenderness. There is no rebound and no guarding.  Musculoskeletal: She exhibits no edema.  Neurological: She is alert and oriented to person, place, and time. Coordination normal.  Skin: Skin is warm and dry.  Psychiatric: She has a normal mood and affect.   Vitals:   09/28/15 0903  BP: (!) 130/92  Pulse: 81  Resp: 16  Temp: 98.3 F (36.8 C)  TempSrc: Oral  SpO2: 96%  Weight: 176 lb (79.8 kg)  Height: 5\' 4"  (1.626 m)      Assessment & Plan:  Flu shot given at visit.

## 2015-09-29 LAB — HEPATITIS C ANTIBODY: HCV AB: NEGATIVE

## 2015-10-20 ENCOUNTER — Other Ambulatory Visit: Payer: Self-pay | Admitting: Internal Medicine

## 2015-10-20 DIAGNOSIS — Z1231 Encounter for screening mammogram for malignant neoplasm of breast: Secondary | ICD-10-CM

## 2015-11-30 ENCOUNTER — Other Ambulatory Visit: Payer: Self-pay | Admitting: Internal Medicine

## 2016-02-08 ENCOUNTER — Ambulatory Visit
Admission: RE | Admit: 2016-02-08 | Discharge: 2016-02-08 | Disposition: A | Discharge status: Home / Self Care | Payer: Medicare Other | Source: Ambulatory Visit | Attending: Internal Medicine | Admitting: Internal Medicine

## 2016-02-08 DIAGNOSIS — Z1231 Encounter for screening mammogram for malignant neoplasm of breast: Secondary | ICD-10-CM

## 2016-03-01 ENCOUNTER — Other Ambulatory Visit: Payer: Self-pay | Admitting: Internal Medicine

## 2016-03-31 DIAGNOSIS — E119 Type 2 diabetes mellitus without complications: Secondary | ICD-10-CM

## 2016-03-31 HISTORY — DX: Type 2 diabetes mellitus without complications: E11.9

## 2016-04-10 ENCOUNTER — Ambulatory Visit (INDEPENDENT_AMBULATORY_CARE_PROVIDER_SITE_OTHER): Payer: Medicare Other | Admitting: Internal Medicine

## 2016-04-10 ENCOUNTER — Encounter: Payer: Self-pay | Admitting: Internal Medicine

## 2016-04-10 ENCOUNTER — Other Ambulatory Visit (INDEPENDENT_AMBULATORY_CARE_PROVIDER_SITE_OTHER): Payer: Medicare Other

## 2016-04-10 VITALS — BP 124/72 | HR 72 | Temp 97.9°F | Resp 14 | Ht 64.0 in | Wt 177.0 lb

## 2016-04-10 DIAGNOSIS — E119 Type 2 diabetes mellitus without complications: Secondary | ICD-10-CM

## 2016-04-10 DIAGNOSIS — B372 Candidiasis of skin and nail: Secondary | ICD-10-CM

## 2016-04-10 LAB — HEMOGLOBIN A1C: Hgb A1c MFr Bld: 7.3 % — ABNORMAL HIGH (ref 4.6–6.5)

## 2016-04-10 MED ORDER — CLOTRIMAZOLE-BETAMETHASONE 1-0.05 % EX CREA: 1.0000 "application " | TOPICAL_CREAM | Freq: Two times a day (BID) | CUTANEOUS | 2 refills | Status: DC

## 2016-04-10 NOTE — Progress Notes (Signed)
Pre visit review using our clinic review tool, if applicable. No additional management support is needed unless otherwise documented below in the visit note. 

## 2016-04-10 NOTE — Patient Instructions (Signed)
We have sent in the cream for the rash which you can use twice a day.   It is safe to take claritin (loratadine) without the  D part.

## 2016-04-10 NOTE — Progress Notes (Signed)
Subjective:    Patient ID: Anna Cooper, female    DOB: 1950-03-31, 66 y.o.   MRN: [id number]  HPI The patient is a 67 YO female coming in for follow up of her diabetes (not on meds currently, taking statin but not ACE-I or ARB, not known to be complicated, diet and exercise for control), as well as new fungal infection (on her thighs which is itching and burns with scratching, showed up about 1-2 months ago, she has tried otc creams which have not helped).   Review of Systems  Constitutional: Negative for activity change, appetite change, diaphoresis, fatigue, fever and unexpected weight change.  Respiratory: Negative.   Cardiovascular: Negative.   Gastrointestinal: Negative.   Musculoskeletal: Negative.   Skin: Positive for rash. Negative for color change, pallor and wound.  Neurological: Negative for dizziness, tremors, syncope, facial asymmetry, weakness and numbness.      Objective:   Physical Exam  Constitutional: She is oriented to person, place, and time. She appears well-developed and well-nourished.  HENT:  Head: Normocephalic and atraumatic.  Eyes: EOM are normal.  Neck: Normal range of motion.  Cardiovascular: Normal rate and regular rhythm.   Pulmonary/Chest: Effort normal.  Abdominal: Soft.  Neurological: She is alert and oriented to person, place, and time.  Skin: Skin is warm and dry.  Rash on bilateral thighs with some satellite lesions, appears to be candidal.    Vitals:   04/10/16 0832  BP: 124/72  Pulse: 72  Resp: 14  Temp: 97.9 F (36.6 C)  TempSrc: Oral  SpO2: 97%  Weight: 177 lb (80.3 kg)  Height: 5\' 4"  (1.626 m)      Assessment & Plan:

## 2016-04-11 DIAGNOSIS — B372 Candidiasis of skin and nail: Secondary | ICD-10-CM | POA: Insufficient documentation

## 2016-04-11 NOTE — Assessment & Plan Note (Signed)
Rx for lotrisone cream to use and talked to her about managing sweating to help avoid recurrence and need to call sooner when this starts to avoid spreading.

## 2016-04-11 NOTE — Assessment & Plan Note (Signed)
Checking HgA1c, managed by diet and not complicated. Taking statin but not ACE-I or ARB. Adjust as needed for goal <7.5

## 2016-05-15 ENCOUNTER — Telehealth: Payer: Self-pay | Admitting: Internal Medicine

## 2016-05-15 NOTE — Telephone Encounter (Signed)
Pt came by and said that on Wednesday she noticed a "pink tint" when she used the bathroom. On Friday she had some pretty heavy bleeding but nothing since then. She said that she went through menopause about 15 years ago and did not know if she should make an appointment to see Dr Okey Dupre about this or if she should make an appointment with a gyno. Please advise.

## 2016-05-15 NOTE — Telephone Encounter (Signed)
Please advise 

## 2016-05-16 NOTE — Telephone Encounter (Signed)
Advised patient to see gyn

## 2016-05-16 NOTE — Telephone Encounter (Signed)
If she has a gynecologist would be more appropriate to see them about this.

## 2016-06-07 ENCOUNTER — Telehealth: Payer: Self-pay | Admitting: Internal Medicine

## 2016-06-07 ENCOUNTER — Encounter: Payer: Self-pay | Admitting: Internal Medicine

## 2016-06-07 NOTE — Telephone Encounter (Signed)
LVM stating she can call the GI place that did it before and they should be able to schedule her and if she has anymore questions then to call

## 2016-06-07 NOTE — Telephone Encounter (Signed)
Generally she can just call gi office where last colonoscopy done and does not need referral.

## 2016-06-07 NOTE — Telephone Encounter (Signed)
Pt is due for a colonoscopy (last was done on 07/03/2006). Can this order/referal be put in for her? She is also due for a physical and annual wellness visit in September. This has already been scheduled.  **She is having a sonogram done on Friday June 1st 2018 by Dr Elon SpannerLeger for bleeding after monopause.  Please advise.

## 2016-06-14 ENCOUNTER — Telehealth: Payer: Self-pay | Admitting: Internal Medicine

## 2016-06-14 NOTE — Telephone Encounter (Signed)
Letter printed and signed, to be faxed.

## 2016-06-14 NOTE — Telephone Encounter (Signed)
Pt came in and stated she needs medical clearance for her surgery at [facility] , the surgery is to remove pollups and to biopsy them  on 06/24/2016, she needs a letter faxed to her OBYGN stating she is cleared and the Fax Number is [phone] Office number is [phone] Dr. Belva AgeeElise Leger is her OBGYN   If any issues please call Pt.

## 2016-06-17 ENCOUNTER — Other Ambulatory Visit (HOSPITAL_COMMUNITY): Payer: Self-pay | Admitting: Obstetrics and Gynecology

## 2016-06-17 DIAGNOSIS — I10 Essential (primary) hypertension: Secondary | ICD-10-CM

## 2016-06-17 NOTE — Telephone Encounter (Signed)
Faxed

## 2016-06-18 ENCOUNTER — Ambulatory Visit (HOSPITAL_COMMUNITY)
Admission: RE | Admit: 2016-06-18 | Discharge: 2016-06-18 | Disposition: A | Discharge status: Home / Self Care | Payer: Medicare Other | Source: Ambulatory Visit | Attending: Obstetrics and Gynecology | Admitting: Obstetrics and Gynecology

## 2016-06-18 ENCOUNTER — Encounter (HOSPITAL_BASED_OUTPATIENT_CLINIC_OR_DEPARTMENT_OTHER): Payer: Self-pay | Admitting: *Deleted

## 2016-06-18 DIAGNOSIS — R9431 Abnormal electrocardiogram [ECG] [EKG]: Secondary | ICD-10-CM | POA: Diagnosis not present

## 2016-06-18 DIAGNOSIS — I517 Cardiomegaly: Secondary | ICD-10-CM | POA: Insufficient documentation

## 2016-06-18 DIAGNOSIS — I10 Essential (primary) hypertension: Secondary | ICD-10-CM | POA: Diagnosis present

## 2016-06-18 NOTE — H&P (Addendum)
Anna Cooper is an 66 y.o. female. With PMB, found to have likely polyps on TVUS. Declines EMB.   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 Patient is postmenopausal.     Past Medical History:  Diagnosis Date  . Fuchs' corneal dystrophy    s/p  bilateral corneal transplant at [facility] -- right 09-24-2011 and left 02-11-2012  . History of recurrent UTIs   . History of venereal warts   . Hyperlipidemia   . Hypertension   . Infantile paralysis   . Major depression   . PMB (postmenopausal bleeding)   . Type 2 diabetes mellitus ([place])     Past Surgical History:  Procedure Laterality Date  . BUNIONECTOMY WITH HAMMERTOE RECONSTRUCTION Bilateral right 07/ 2013;  left 11/ 2013  . 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 TUBAL LIGATION      Family History  Problem Relation Age of Onset  . Cancer Mother        Breast, was in remission but had relapse with mets.  . Diabetes Mother   . Heart attack Father   . Seizures Father   . Heart disease Father        CAD/MI-fatal  . Cancer Sister        Breast Cancer  . Cancer Sister        breast, BRCA +  . Cancer Paternal Uncle        Prostate Cancer  . Cancer Maternal Grandfather        Lung Cancer  . Cancer Maternal Aunt        breast    Social History:  reports that she has never smoked. She has never used smokeless tobacco. She reports that she does not drink alcohol or use drugs.  Allergies: No Known Allergies  No prescriptions prior to admission.    ROS  There were no vitals taken for this  visit. Physical Exam  (from clinic) NAD, A&O NWOB Abd soft, nondistended, gravid GYN exam difficult s/s pt discomfort. + vaginal atrophy, small uterus   No results found for this or any previous visit (from the past 24 hour(s)).  No results found.  Assessment/Plan: 66 yo presenting with PMB found to have thickened EMS on [place] and likely polyp presenting for scheduled [place] D&C and poylpectomy. Cleared for surgery by PCP. Plan misoprostol night before and AM of surgery. Risks discussed including infection, bleeding, damage to surrounding structures, need for additional procedures.All questions answered in office. Consent signed.    Lucillie Garfinkel MD   [place]in [place] Cinqu[place] 06/18/2016, 2:18 PM   No updates to above H&P. Patient arrived NPO and was consented in PACU. Risks discussed including infection, bleeding, damage to surrounding structures, need for additional procedures, and fluid overload. All questions answered. Consent signed. Proceed with above surgery.   Lucillie Garfinkel MD

## 2016-06-19 ENCOUNTER — Encounter (HOSPITAL_BASED_OUTPATIENT_CLINIC_OR_DEPARTMENT_OTHER): Payer: Self-pay | Admitting: *Deleted

## 2016-06-19 NOTE — Progress Notes (Signed)
[facility]Istat on arrival,Ekg with chart-Npo after  Mn.

## 2016-06-24 ENCOUNTER — Encounter (HOSPITAL_BASED_OUTPATIENT_CLINIC_OR_DEPARTMENT_OTHER)
Admission: RE | Disposition: A | Discharge status: Home / Self Care | Payer: Self-pay | Source: Ambulatory Visit | Attending: Obstetrics and Gynecology

## 2016-06-24 ENCOUNTER — Ambulatory Visit (HOSPITAL_BASED_OUTPATIENT_CLINIC_OR_DEPARTMENT_OTHER)
Admission: RE | Admit: 2016-06-24 | Discharge: 2016-06-24 | Disposition: A | Discharge status: Home / Self Care | Payer: Medicare Other | Source: Ambulatory Visit | Attending: Obstetrics and Gynecology | Admitting: Obstetrics and Gynecology

## 2016-06-24 ENCOUNTER — Ambulatory Visit (HOSPITAL_BASED_OUTPATIENT_CLINIC_OR_DEPARTMENT_OTHER): Payer: Medicare Other | Admitting: Anesthesiology

## 2016-06-24 ENCOUNTER — Encounter (HOSPITAL_BASED_OUTPATIENT_CLINIC_OR_DEPARTMENT_OTHER): Payer: Self-pay | Admitting: *Deleted

## 2016-06-24 DIAGNOSIS — N950 Postmenopausal bleeding: Secondary | ICD-10-CM | POA: Insufficient documentation

## 2016-06-24 DIAGNOSIS — E785 Hyperlipidemia, unspecified: Secondary | ICD-10-CM | POA: Insufficient documentation

## 2016-06-24 DIAGNOSIS — F329 Major depressive disorder, single episode, unspecified: Secondary | ICD-10-CM | POA: Insufficient documentation

## 2016-06-24 DIAGNOSIS — I10 Essential (primary) hypertension: Secondary | ICD-10-CM | POA: Diagnosis not present

## 2016-06-24 DIAGNOSIS — E119 Type 2 diabetes mellitus without complications: Secondary | ICD-10-CM | POA: Diagnosis not present

## 2016-06-24 DIAGNOSIS — Z947 Corneal transplant status: Secondary | ICD-10-CM | POA: Insufficient documentation

## 2016-06-24 DIAGNOSIS — Z9851 Tubal ligation status: Secondary | ICD-10-CM | POA: Diagnosis not present

## 2016-06-24 HISTORY — DX: Endothelial corneal dystrophy, unspecified eye: H18.519

## 2016-06-24 HISTORY — DX: Major depressive disorder, single episode, unspecified: F32.9

## 2016-06-24 HISTORY — PX: DILATATION & CURETTAGE/HYSTEROSCOPY WITH MYOSURE: SHX6511

## 2016-06-24 HISTORY — DX: Postmenopausal bleeding: N95.0

## 2016-06-24 HISTORY — DX: Type 2 diabetes mellitus without complications: E11.9

## 2016-06-24 HISTORY — DX: Endothelial corneal dystrophy: H18.51

## 2016-06-24 LAB — TYPE AND SCREEN
ABO/RH(D): A POS
ANTIBODY SCREEN: NEGATIVE

## 2016-06-24 LAB — POCT I-STAT 4, (NA,K, GLUC, HGB,HCT)
Glucose, Bld: 128 mg/dL — ABNORMAL HIGH (ref 65–99)
HCT: 40 % (ref 36.0–46.0)
HEMOGLOBIN: 13.6 g/dL (ref 12.0–15.0)
POTASSIUM: 3.3 mmol/L — AB (ref 3.5–5.1)
SODIUM: 142 mmol/L (ref 135–145)

## 2016-06-24 LAB — ABO/RH: ABO/RH(D): A POS

## 2016-06-24 SURGERY — DILATATION & CURETTAGE/HYSTEROSCOPY WITH MYOSURE
Anesthesia: General | Site: Uterus

## 2016-06-24 MED ORDER — PROPOFOL 10 MG/ML IV BOLUS
INTRAVENOUS | Status: DC | PRN
  Administered 2016-06-24: 200 mg via INTRAVENOUS

## 2016-06-24 MED ORDER — HYDROMORPHONE HCL 1 MG/ML IJ SOLN
0.2500 mg | INTRAMUSCULAR | Status: DC | PRN
  Filled 2016-06-24: qty 0.5

## 2016-06-24 MED ORDER — ARTIFICIAL TEARS OPHTHALMIC OINT
TOPICAL_OINTMENT | OPHTHALMIC | Status: AC
  Filled 2016-06-24: qty 3.5

## 2016-06-24 MED ORDER — SODIUM CHLORIDE 0.9 % IR SOLN
Status: DC | PRN
Start: 2016-06-24 — End: 2016-06-24
  Administered 2016-06-24: 1

## 2016-06-24 MED ORDER — OXYCODONE HCL 5 MG/5ML PO SOLN
5.0000 mg | Freq: Once | ORAL | Status: DC | PRN
Start: 2016-06-24 — End: 2016-06-24
  Filled 2016-06-24: qty 5

## 2016-06-24 MED ORDER — ONDANSETRON HCL 4 MG PO TABS
4.0000 mg | ORAL_TABLET | Freq: Four times a day (QID) | ORAL | Status: DC | PRN
  Filled 2016-06-24: qty 1

## 2016-06-24 MED ORDER — PROPOFOL 10 MG/ML IV BOLUS
INTRAVENOUS | Status: AC
  Filled 2016-06-24: qty 40

## 2016-06-24 MED ORDER — LIDOCAINE HCL 1 % IJ SOLN
INTRAMUSCULAR | Status: AC
  Filled 2016-06-24: qty 20

## 2016-06-24 MED ORDER — DEXAMETHASONE SODIUM PHOSPHATE 10 MG/ML IJ SOLN
INTRAMUSCULAR | Status: AC
  Filled 2016-06-24: qty 1

## 2016-06-24 MED ORDER — LIDOCAINE 2% (20 MG/ML) 5 ML SYRINGE
INTRAMUSCULAR | Status: AC
Start: 2016-06-24 — End: 2016-06-24
  Filled 2016-06-24: qty 5

## 2016-06-24 MED ORDER — KETOROLAC TROMETHAMINE 30 MG/ML IJ SOLN
INTRAMUSCULAR | Status: DC | PRN
  Administered 2016-06-24: 30 mg via INTRAVENOUS

## 2016-06-24 MED ORDER — LIDOCAINE 2% (20 MG/ML) 5 ML SYRINGE
INTRAMUSCULAR | Status: DC | PRN
  Administered 2016-06-24: 80 mg via INTRAVENOUS

## 2016-06-24 MED ORDER — MIDAZOLAM HCL 5 MG/5ML IJ SOLN
INTRAMUSCULAR | Status: DC | PRN
  Administered 2016-06-24: 1 mg via INTRAVENOUS

## 2016-06-24 MED ORDER — LIDOCAINE HCL 1 % IJ SOLN
INTRAMUSCULAR | Status: DC | PRN
  Administered 2016-06-24: 10 mL

## 2016-06-24 MED ORDER — OXYCODONE-ACETAMINOPHEN 5-325 MG PO TABS
1.0000 | ORAL_TABLET | ORAL | Status: DC | PRN
  Filled 2016-06-24: qty 2

## 2016-06-24 MED ORDER — FENTANYL CITRATE (PF) 100 MCG/2ML IJ SOLN
INTRAMUSCULAR | Status: AC
  Filled 2016-06-24: qty 2

## 2016-06-24 MED ORDER — KETOROLAC TROMETHAMINE 30 MG/ML IJ SOLN
INTRAMUSCULAR | Status: AC
  Filled 2016-06-24: qty 1

## 2016-06-24 MED ORDER — FENTANYL CITRATE (PF) 100 MCG/2ML IJ SOLN
INTRAMUSCULAR | Status: DC | PRN
  Administered 2016-06-24 (×2): 50 ug via INTRAVENOUS

## 2016-06-24 MED ORDER — ONDANSETRON HCL 4 MG/2ML IJ SOLN
INTRAMUSCULAR | Status: AC
  Filled 2016-06-24: qty 2

## 2016-06-24 MED ORDER — ONDANSETRON HCL 4 MG/2ML IJ SOLN
4.0000 mg | Freq: Four times a day (QID) | INTRAMUSCULAR | Status: DC | PRN
Start: 2016-06-24 — End: 2016-06-24
  Filled 2016-06-24: qty 2

## 2016-06-24 MED ORDER — OXYCODONE HCL 5 MG PO TABS
5.0000 mg | ORAL_TABLET | Freq: Once | ORAL | Status: DC | PRN
  Filled 2016-06-24: qty 1

## 2016-06-24 MED ORDER — PROMETHAZINE HCL 25 MG/ML IJ SOLN
6.2500 mg | INTRAMUSCULAR | Status: DC | PRN
  Filled 2016-06-24: qty 1

## 2016-06-24 MED ORDER — ONDANSETRON HCL 4 MG/2ML IJ SOLN
INTRAMUSCULAR | Status: DC | PRN
  Administered 2016-06-24: 4 mg via INTRAVENOUS

## 2016-06-24 MED ORDER — MIDAZOLAM HCL 2 MG/2ML IJ SOLN
INTRAMUSCULAR | Status: AC
  Filled 2016-06-24: qty 2

## 2016-06-24 MED ORDER — IBUPROFEN 600 MG PO TABS
600.0000 mg | ORAL_TABLET | Freq: Four times a day (QID) | ORAL | Status: DC | PRN
  Filled 2016-06-24: qty 1

## 2016-06-24 MED ORDER — LIDOCAINE 2% (20 MG/ML) 5 ML SYRINGE
INTRAMUSCULAR | Status: AC
  Filled 2016-06-24: qty 5

## 2016-06-24 MED ORDER — DEXAMETHASONE SODIUM PHOSPHATE 4 MG/ML IJ SOLN
INTRAMUSCULAR | Status: DC | PRN
  Administered 2016-06-24: 10 mg via INTRAVENOUS

## 2016-06-24 MED ORDER — MEPERIDINE HCL 25 MG/ML IJ SOLN
6.2500 mg | INTRAMUSCULAR | Status: DC | PRN
  Filled 2016-06-24: qty 1

## 2016-06-24 MED ORDER — LACTATED RINGERS IV SOLN
INTRAVENOUS | Status: DC
  Administered 2016-06-24: 07:00:00 via INTRAVENOUS
  Filled 2016-06-24: qty 1000

## 2016-06-24 SURGICAL SUPPLY — 34 items
BIPOLAR CUTTING LOOP 21FR (ELECTRODE)
CANISTER SUCT 3000ML PPV (MISCELLANEOUS) ×6 IMPLANT
CATH ROBINSON RED A/P 16FR (CATHETERS) ×3 IMPLANT
CLOTH BEACON ORANGE TIMEOUT ST (SAFETY) ×3 IMPLANT
COUNTER NEEDLE 1200 MAGNETIC (NEEDLE) ×3 IMPLANT
DEVICE MYOSURE LITE (MISCELLANEOUS) ×2 IMPLANT
DEVICE MYOSURE REACH (MISCELLANEOUS) IMPLANT
DILATOR CANAL MILEX (MISCELLANEOUS) IMPLANT
DRAPE HYSTEROSCOPY (DRAPE) ×3 IMPLANT
DRSG TELFA 3X8 NADH (GAUZE/BANDAGES/DRESSINGS) ×3 IMPLANT
ELECT REM PT RETURN 9FT ADLT (ELECTROSURGICAL) ×3
ELECTRODE REM PT RTRN 9FT ADLT (ELECTROSURGICAL) ×1 IMPLANT
FILTER ARTHROSCOPY CONVERTOR (FILTER) ×3 IMPLANT
GLOVE BIO SURGEON STRL SZ 6.5 (GLOVE) ×2 IMPLANT
GLOVE BIO SURGEONS STRL SZ 6.5 (GLOVE) ×1
GLOVE BIOGEL PI IND STRL 6.5 (GLOVE) ×1 IMPLANT
GLOVE BIOGEL PI IND STRL 7.0 (GLOVE) ×1 IMPLANT
GLOVE BIOGEL PI INDICATOR 6.5 (GLOVE) ×2
GLOVE BIOGEL PI INDICATOR 7.0 (GLOVE) ×2
GOWN STRL REUS W/ TWL XL LVL3 (GOWN DISPOSABLE) ×1 IMPLANT
GOWN STRL REUS W/TWL XL LVL3 (GOWN DISPOSABLE) ×3
IV NS IRRIG 3000ML ARTHROMATIC (IV SOLUTION) ×3 IMPLANT
KIT RM TURNOVER CYSTO AR (KITS) ×3 IMPLANT
LOOP CUTTING BIPOLAR 21FR (ELECTRODE) IMPLANT
MYOSURE XL FIBROID REM (MISCELLANEOUS)
NS IRRIG 500ML POUR BTL (IV SOLUTION) IMPLANT
PAD DRESSING TELFA 3X8 NADH (GAUZE/BANDAGES/DRESSINGS) ×1 IMPLANT
PAD OB MATERNITY 4.3X12.25 (PERSONAL CARE ITEMS) ×3 IMPLANT
SEAL ROD LENS SCOPE MYOSURE (ABLATOR) ×5 IMPLANT
SYSTEM TISS REMOVAL MYSR XL RM (MISCELLANEOUS) IMPLANT
TOWEL OR 17X24 6PK STRL BLUE (TOWEL DISPOSABLE) ×6 IMPLANT
TUBING AQUILEX INFLOW (TUBING) ×3 IMPLANT
TUBING AQUILEX OUTFLOW (TUBING) ×3 IMPLANT
WATER STERILE IRR 500ML POUR (IV SOLUTION) ×3 IMPLANT

## 2016-06-24 NOTE — Anesthesia Procedure Notes (Signed)
Procedure Name: LMA Insertion Date/Time: 06/24/2016 7:34 AM Performed by: Anitra LauthMILLER, WARREN RAY Pre-anesthesia Checklist: Patient identified, Emergency Drugs available, Suction available and Patient being monitored Patient Re-evaluated:Patient Re-evaluated prior to inductionOxygen Delivery Method: Circle system utilized Preoxygenation: Pre-oxygenation with 100% oxygen Intubation Type: IV induction Ventilation: Mask ventilation without difficulty LMA: LMA inserted LMA Size: 4.0 Number of attempts: 1 Airway Equipment and Method: Bite block Placement Confirmation: positive ETCO2 Tube secured with: Tape Dental Injury: Teeth and Oropharynx as per pre-operative assessment

## 2016-06-24 NOTE — Anesthesia Preprocedure Evaluation (Addendum)
Anesthesia Evaluation  Patient identified by MRN, date of birth, ID band Patient awake    Reviewed: Allergy & Precautions, NPO status , Patient's Chart, lab work & pertinent test results  Airway Mallampati: II  TM Distance: >3 FB Neck ROM: Full    Dental no notable dental hx.    Pulmonary neg pulmonary ROS,    Pulmonary exam normal breath sounds clear to auscultation       Cardiovascular hypertension, negative cardio ROS Normal cardiovascular exam Rhythm:Regular Rate:Normal     Neuro/Psych negative neurological ROS  negative psych ROS   GI/Hepatic negative GI ROS, Neg liver ROS,   Endo/Other  negative endocrine ROSdiabetes  Renal/GU negative Renal ROS  negative genitourinary   Musculoskeletal negative musculoskeletal ROS (+)   Abdominal   Peds negative pediatric ROS (+)  Hematology negative hematology ROS (+)   Anesthesia Other Findings   Reproductive/Obstetrics negative OB ROS                           Anesthesia Physical Anesthesia Plan  ASA: II  Anesthesia Plan: General   Post-op Pain Management:    Induction: Intravenous  PONV Risk Score and Plan: 4 or greater and Ondansetron, Dexamethasone, Propofol, Midazolam and Scopolamine patch - Pre-op  Airway Management Planned: LMA  Additional Equipment:   Intra-op Plan:   Post-operative Plan: Extubation in OR  Informed Consent: I have reviewed the patients History and Physical, chart, labs and discussed the procedure including the risks, benefits and alternatives for the proposed anesthesia with the patient or authorized representative who has indicated his/her understanding and acceptance.   Dental advisory given  Plan Discussed with: CRNA  Anesthesia Plan Comments:        Anesthesia Quick Evaluation

## 2016-06-24 NOTE — Transfer of Care (Signed)
  Last Vitals:  Vitals:   06/24/16 0602 06/24/16 0808  BP: (!) 123/49   Pulse: 73 81  Resp: 16 14  Temp: 37.2 C 36.6 C    Last Pain:  Vitals:   06/24/16 0602  TempSrc: Oral      Patients Stated Pain Goal: 6 (06/24/16 [id number])  Immediate Anesthesia Transfer of Care Note  Patient: Anna Cooper  Procedure(s) Performed: Procedure(s) (LRB): DILATATION & CURETTAGE/HYSTEROSCOPY WITH MYOSURE (N/A)  Patient Location: PACU  Anesthesia Type: General  Level of Consciousness: awake, alert  and oriented  Airway & Oxygen Therapy: Patient Spontanous Breathing and Patient connected to face mask oxygen  Post-op Assessment: Report given to PACU RN and Post -op Vital signs reviewed and stable  Post vital signs: Reviewed and stable  Complications: No apparent anesthesia complications

## 2016-06-24 NOTE — Op Note (Signed)
PREOPERATIVE DIAGNOSES: 1. Postmenopausal bleeding  POSTOPERATIVE DIAGNOSES: Same  PROCEDURE PERFORMED: Dilation, curretage, polypectomy, hysteroscopy   SURGEON: Dr. Lucillie Garfinkel  ANESTHESIA: MAC  ESTIMATED BLOOD LOSS: 5cc  Fluid deficit: 200cc  IVF: 209OB LR  COMPLICATIONS: None  TUBES: None.  DRAINS: None  PATHOLOGY: Endometrial curretings and polyp  FINDINGS: On exam, under anesthesia, normal appearing vulva and vagina, 6 week sized uterus  Operative findings demonstrated two polyps and a plethora of fluffy endometrium. B/l ostia visualized  Procedure: The patient was taken to the operating room where she was properly prepped and draped in sterile manner under general anesthesia. After bimanual examination, the cervix was exposed with a speculum and the anterior lip of the cervix grasped with a tenaculum.  The endocervical canal was then progressively dilated to 24m. The hysteroscope was then introduced into the uterine cavity using sterile saline solution as a distending media and with attached video camera. The endometrial cavity was distended with fluids and the cavity with the b/l ostia were visualized. A polyp was noted. The myosure was introduced and the polyp easily removed. Several pictures were taken of the endometrial cavity and the hysteroscope removed from the cavity. Sharp curretage was then performed. The sponge and lap counts were correct times 2 at this time. The patient's procedure was terminated. We then awakened her. She was sent to the Recovery Room in good condition.    ELucillie GarfinkelMD

## 2016-06-24 NOTE — Anesthesia Postprocedure Evaluation (Signed)
Anesthesia Post Note  Patient: Anna Cooper  Procedure(s) Performed: Procedure(s) (LRB): DILATATION & CURETTAGE/HYSTEROSCOPY WITH MYOSURE (N/A)     Patient location during evaluation: PACU Anesthesia Type: General Level of consciousness: awake and alert Pain management: pain level controlled Vital Signs Assessment: post-procedure vital signs reviewed and stable Respiratory status: spontaneous breathing, nonlabored ventilation and respiratory function stable Cardiovascular status: blood pressure returned to baseline and stable Postop Assessment: no signs of nausea or vomiting Anesthetic complications: no    Last Vitals:  Vitals:   06/24/16 0915 06/24/16 0930  BP: 116/69 117/77  Pulse: (!) 51 (!) 57  Resp: 16 16  Temp:      Last Pain:  Vitals:   06/24/16 0829  TempSrc:   PainSc: 0-No pain                 Lowella CurbWarren Ray Zanyla Klebba

## 2016-06-24 NOTE — Brief Op Note (Signed)
06/24/2016  8:01 AM  PATIENT:  Anna GreathouseFrenchie H Luedke  66 y.o. female  PRE-OPERATIVE DIAGNOSIS:  pmb  POST-OPERATIVE DIAGNOSIS:  * No post-op diagnosis entered *  PROCEDURE:  Procedure(s): DILATATION & CURETTAGE/HYSTEROSCOPY WITH MYOSURE (N/A)  SURGEON:  Surgeon(s) and Role:    * Hazley Dezeeuw, Madelaine EtienneElise Jennifer, MD - Primary  PHYSICIAN ASSISTANT:   ASSISTANTS: none   ANESTHESIA:   IV sedation  EBL:  Total I/O In: 500 [I.V.:500] Out: 10 [Blood:10]  BLOOD ADMINISTERED:none  DRAINS: none   LOCAL MEDICATIONS USED:  LIDOCAINE  and Amount: 10 ml  SPECIMEN:  Source of Specimen:  endometrial curettings and polyp  DISPOSITION OF SPECIMEN:  PATHOLOGY  COUNTS:  YES  TOURNIQUET:  * No tourniquets in log *  DICTATION: .Note written in EPIC  PLAN OF CARE: Discharge to home after PACU  PATIENT DISPOSITION:  PACU - hemodynamically stable.   Delay start of Pharmacological VTE agent (>24hrs) due to surgical blood loss or risk of bleeding: not applicable

## 2016-06-24 NOTE — Discharge Instructions (Signed)
° °  D & C Home care Instructions: ° ° °Personal hygiene:  Used sanitary napkins for vaginal drainage not tampons. Shower or tub bathe the day after your procedure. No douching until bleeding stops. Always wipe from front to back after  Elimination. ° °Activity: Do not drive or operate any equipment today. The effects of the anesthesia are still present and drowsiness may result. Rest today, not necessarily flat bed rest, just take it easy. You may resume your normal activity in one to 2 days. ° °Sexual activity: No intercourse for one week or as indicated by your physician ° °Diet: Eat a light diet as desired this evening. You may resume a regular diet tomorrow. ° °Return to work: One to 2 days. ° °General Expectations of your surgery: Vaginal bleeding should be no heavier than a normal period. Spotting may continue up to 10 days. Mild cramps may continue for a couple of days. You may have a regular period in 2-6 weeks. ° °Unexpected observations call your doctor if these occur: persistent or heavy bleeding. Severe abdominal cramping or pain. Elevation of temperature greater than 100°F. ° °Call for an appointment in one week. ° ° ° °Patient's Signature_______________________________________________________ ° °Nurse's Signature________________________________________________________ ° ° °Post Anesthesia Home Care Instructions ° °Activity: °Get plenty of rest for the remainder of the day. A responsible individual must stay with you for 24 hours following the procedure.  °For the next 24 hours, DO NOT: °-Drive a car °-Operate machinery °-Drink alcoholic beverages °-Take any medication unless instructed by your physician °-Make any legal decisions or sign important papers. ° °Meals: °Start with liquid foods such as gelatin or soup. Progress to regular foods as tolerated. Avoid greasy, spicy, heavy foods. If nausea and/or vomiting occur, drink only clear liquids until the nausea and/or vomiting subsides. Call your  physician if vomiting continues. ° °Special Instructions/Symptoms: °Your throat may feel dry or sore from the anesthesia or the breathing tube placed in your throat during surgery. If this causes discomfort, gargle with warm salt water. The discomfort should disappear within 24 hours. ° °If you had a scopolamine patch placed behind your ear for the management of post- operative nausea and/or vomiting: ° °1. The medication in the patch is effective for 72 hours, after which it should be removed.  Wrap patch in a tissue and discard in the trash. Wash hands thoroughly with soap and water. °2. You may remove the patch earlier than 72 hours if you experience unpleasant side effects which may include dry mouth, dizziness or visual disturbances. °3. Avoid touching the patch. Wash your hands with soap and water after contact with the patch. °  ° °

## 2016-06-25 ENCOUNTER — Encounter (HOSPITAL_BASED_OUTPATIENT_CLINIC_OR_DEPARTMENT_OTHER): Payer: Self-pay | Admitting: Obstetrics and Gynecology

## 2016-07-01 ENCOUNTER — Ambulatory Visit (AMBULATORY_SURGERY_CENTER): Payer: Self-pay | Admitting: *Deleted

## 2016-07-01 ENCOUNTER — Encounter: Payer: Self-pay | Admitting: Internal Medicine

## 2016-07-01 VITALS — Ht 64.5 in | Wt 176.0 lb

## 2016-07-01 DIAGNOSIS — Z1211 Encounter for screening for malignant neoplasm of colon: Secondary | ICD-10-CM

## 2016-07-01 NOTE — Progress Notes (Signed)
Patient denies any allergies to eggs or soy. Patient denies any problems with anesthesia/sedation. Patient denies any oxygen use at home and does not take any diet/weight loss medications. EMMI education assisgned to patient on colonoscopy, this was explained and instructions given to patient. 

## 2016-07-15 ENCOUNTER — Encounter: Payer: Self-pay | Admitting: Internal Medicine

## 2016-07-15 ENCOUNTER — Ambulatory Visit (AMBULATORY_SURGERY_CENTER): Payer: Medicare Other | Admitting: Internal Medicine

## 2016-07-15 VITALS — BP 119/57 | HR 60 | Temp 98.6°F | Resp 12 | Ht 64.5 in | Wt 176.0 lb

## 2016-07-15 DIAGNOSIS — Z1212 Encounter for screening for malignant neoplasm of rectum: Secondary | ICD-10-CM

## 2016-07-15 DIAGNOSIS — Z1211 Encounter for screening for malignant neoplasm of colon: Secondary | ICD-10-CM | POA: Diagnosis present

## 2016-07-15 MED ORDER — SODIUM CHLORIDE 0.9 % IV SOLN: 500.0000 mL | INTRAVENOUS | Status: DC

## 2016-07-15 NOTE — Op Note (Signed)
[facility] Patient Name: Anna Cooper Procedure Date: 07/15/2016 2:04 PM MRN: [id number] Endoscopist: Iva Boop , MD Age: 66 Referring MD:  Date of Birth: [birth year] Gender: Female Account #: [id number] Procedure:                Colonoscopy Indications:              Screening for colorectal malignant neoplasm, Last                            colonoscopy: 2008 Medicines:                Propofol per Anesthesia, Monitored Anesthesia Care Procedure:                Pre-Anesthesia Assessment:                           - Prior to the procedure, a History and Physical                            was performed, and patient medications and                            allergies were reviewed. The patient's tolerance of                            previous anesthesia was also reviewed. The risks                            and benefits of the procedure and the sedation                            options and risks were discussed with the patient.                            All questions were answered, and informed consent                            was obtained. Prior Anticoagulants: The patient has                            taken no previous anticoagulant or antiplatelet                            agents. ASA Grade Assessment: II - A patient with                            mild systemic disease. After reviewing the risks                            and benefits, the patient was deemed in                            satisfactory condition to undergo the procedure.  After obtaining informed consent, the colonoscope                            was passed under direct vision. Throughout the                            procedure, the patient's blood pressure, pulse, and                            oxygen saturations were monitored continuously. The                            Colonoscope was introduced through the anus and                            advanced to the  the cecum, identified by                            appendiceal orifice and ileocecal valve. The                            colonoscopy was performed without difficulty. The                            patient tolerated the procedure well. The quality                            of the bowel preparation was good. The bowel                            preparation used was Miralax. The ileocecal valve,                            appendiceal orifice, and rectum were photographed. Scope In: 2:08:32 PM Scope Out: 2:22:41 PM Scope Withdrawal Time: 0 hours 8 minutes 24 seconds  Total Procedure Duration: 0 hours 14 minutes 9 seconds  Findings:                 The perianal and digital rectal examinations were                            normal.                           The entire examined colon appeared normal on direct                            and retroflexion views. Complications:            No immediate complications. Estimated Blood Loss:     Estimated blood loss: none. Impression:               - The entire examined colon is normal on direct and  retroflexion views.                           - No specimens collected. Recommendation:           - Patient has a contact number available for                            emergencies. The signs and symptoms of potential                            delayed complications were discussed with the                            patient. Return to normal activities tomorrow.                            Written discharge instructions were provided to the                            patient.                           - Resume previous diet.                           - Continue present medications.                           - Repeat colonoscopy in 10 years for screening                            purposes. Iva Boop, MD 07/15/2016 2:25:02 PM This report has been signed electronically.

## 2016-07-15 NOTE — Progress Notes (Signed)
Pt's states no medical or surgical changes since previsit or office visit. 

## 2016-07-15 NOTE — Patient Instructions (Signed)
Discharge instructions given. Normal exam. Resume previous medications. YOU HAD AN ENDOSCOPIC PROCEDURE TODAY AT THE [facility]:   Refer to the procedure report that was given to you for any specific questions about what was found during the examination.  If the procedure report does not answer your questions, please call your gastroenterologist to clarify.  If you requested that your care partner not be given the details of your procedure findings, then the procedure report has been included in a sealed envelope for you to review at your convenience later.  YOU SHOULD EXPECT: Some feelings of bloating in the abdomen. Passage of more gas than usual.  Walking can help get rid of the air that was put into your GI tract during the procedure and reduce the bloating. If you had a lower endoscopy (such as a colonoscopy or flexible sigmoidoscopy) you may notice spotting of blood in your stool or on the toilet paper. If you underwent a bowel prep for your procedure, you may not have a normal bowel movement for a few days.  Please Note:  You might notice some irritation and congestion in your nose or some drainage.  This is from the oxygen used during your procedure.  There is no need for concern and it should clear up in a day or so.  SYMPTOMS TO REPORT IMMEDIATELY:   Following lower endoscopy (colonoscopy or flexible sigmoidoscopy):  Excessive amounts of blood in the stool  Significant tenderness or worsening of abdominal pains  Swelling of the abdomen that is new, acute  Fever of 100F or higher   For urgent or emergent issues, a gastroenterologist can be reached at any hour by calling [phone].   DIET:  We do recommend a small meal at first, but then you may proceed to your regular diet.  Drink plenty of fluids but you should avoid alcoholic beverages for 24 hours.  ACTIVITY:  You should plan to take it easy for the rest of today and you should NOT DRIVE or use heavy machinery  until tomorrow (because of the sedation medicines used during the test).    FOLLOW UP: Our staff will call the number listed on your records the next business day following your procedure to check on you and address any questions or concerns that you may have regarding the information given to you following your procedure. If we do not reach you, we will leave a message.  However, if you are feeling well and you are not experiencing any problems, there is no need to return our call.  We will assume that you have returned to your regular daily activities without incident.  If any biopsies were taken you will be contacted by phone or by letter within the next 1-3 weeks.  Please call us at [phone] if you have not heard about the biopsies in 3 weeks.    SIGNATURES/CONFIDENTIALITY: You and/or your care partner have signed paperwork which will be entered into your electronic medical record.  These signatures attest to the fact that that the information above on your After Visit Summary has been reviewed and is understood.  Full responsibility of the confidentiality of this discharge information lies with you and/or your care-partner. 

## 2016-07-15 NOTE — Progress Notes (Signed)
Report given to PACU, vss 

## 2016-07-16 ENCOUNTER — Telehealth: Payer: Self-pay | Admitting: *Deleted

## 2016-07-16 NOTE — Telephone Encounter (Signed)
No answer, left message to call if questions or concerns. 

## 2016-08-08 ENCOUNTER — Encounter: Payer: Self-pay | Admitting: Genetics

## 2016-08-12 ENCOUNTER — Other Ambulatory Visit: Payer: Self-pay | Admitting: Internal Medicine

## 2016-08-16 ENCOUNTER — Telehealth: Payer: Self-pay | Admitting: Genetics

## 2016-08-16 NOTE — Telephone Encounter (Signed)
I provided Anna Cooper my fax number and email address to send her sister's report to if she is able to obtain it.  I told her if she cannot get the test report that we can still test her and she should still come in for her appointment next week.

## 2016-08-20 ENCOUNTER — Other Ambulatory Visit: Payer: Medicare Other

## 2016-08-20 ENCOUNTER — Ambulatory Visit (HOSPITAL_BASED_OUTPATIENT_CLINIC_OR_DEPARTMENT_OTHER): Payer: Medicare Other | Admitting: Genetics

## 2016-08-20 DIAGNOSIS — Z803 Family history of malignant neoplasm of breast: Secondary | ICD-10-CM | POA: Diagnosis not present

## 2016-08-20 DIAGNOSIS — Z800 Family history of malignant neoplasm of digestive organs: Secondary | ICD-10-CM | POA: Diagnosis not present

## 2016-08-20 DIAGNOSIS — Z8481 Family history of carrier of genetic disease: Secondary | ICD-10-CM | POA: Diagnosis not present

## 2016-08-20 DIAGNOSIS — Z8042 Family history of malignant neoplasm of prostate: Secondary | ICD-10-CM

## 2016-08-20 DIAGNOSIS — Z7183 Encounter for nonprocreative genetic counseling: Secondary | ICD-10-CM | POA: Diagnosis not present

## 2016-08-20 NOTE — Progress Notes (Signed)
REFERRING PROVIDER: Tyson Dense, MD [address] [place], [place] [address]  PRIMARY PROVIDER:  Hoyt Koch, MD  PRIMARY REASON FOR VISIT:  1. Family history of genetic disease carrier   2. Family history of breast cancer   3. Family history of pancreatic cancer   4. Family history of stomach cancer   5. Family history of prostate cancer   6. Family history of colon cancer     HISTORY OF PRESENT ILLNESS:   Anna Cooper, a 66 y.o. female, was seen for a [facility] cancer genetics consultation at the request of Dr. Royston Sinner due to a family history of a BRCA2 mutation and cancer.  Anna Cooper presents to clinic today to discuss the possibility of a hereditary predisposition to cancer, genetic testing, and to further clarify her future cancer risks, as well as potential cancer risks for family members.    Anna Cooper is a 66 y.o. female with no personal history of cancer.  She had a breast biopsy in 2007 that revealed a papilloma with epithelia hyperplasia and has had an endometrial polyp.    HORMONAL RISK FACTORS:  Menarche was at age 16 First live birth at age 66.  OCP use for approximately <5 years.  Ovaries intact: yes.  Hysterectomy: yes.  Menopausal status: postmenopausal. Started in early 50's HRT use: 0 years. Colonoscopy: yes; a few benign polyps. goes every 10 years. Number of breast biopsies: 1.   Past Medical History:  Diagnosis Date  . Family history of breast cancer   . Family history of colon cancer   . Family history of genetic disease carrier    sister has BRCA2 mutation  . Family history of pancreatic cancer   . Family history of prostate cancer   . Family history of stomach cancer   . Fuchs' corneal dystrophy    s/p  bilateral corneal transplant at [facility] -- right 09-24-2011 and left 02-11-2012  . History of recurrent UTIs   . History of venereal warts   . Hyperlipidemia   . Hypertension   . Infantile paralysis   . Major  depression   . PMB (postmenopausal bleeding)   . Type 2 diabetes mellitus ([place]) 03/31/2016   not on meds currently    Past Surgical History:  Procedure Laterality Date  . BUNIONECTOMY WITH HAMMERTOE RECONSTRUCTION Bilateral right 07/ 2013;  left 11/ 2013  . 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 COLONOSCOPY    . DILATATION & CURETTAGE/HYSTEROSCOPY WITH MYOSURE N/A 06/24/2016   Procedure: DILATATION & CURETTAGE/HYSTEROSCOPY WITH MYOSURE;  Surgeon: Tyson Dense, MD;  Location: [facility];  Service: Gynecology;  Laterality: N/A;  . TUBAL LIGATION  1980's    Social History   Social History  . Marital status: Married    Spouse name: N/A  . Number of children: 2  . Years of education: 20   Occupational History  . educator    Social History Main Topics  . Smoking status: Never Smoker  . Smokeless tobacco: Never Used  . Alcohol use No  . Drug use: No  . Sexual activity: Not Asked   Other Topics Concern  . None   Social History Narrative   HSG, College Grad, [employer], [place]; [employer]; [employer]- education. Married '71-seperated-'06.1 son '71, 1 daughter-'78; 2 grandchildren. 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 Daughter had a baby Jun 11, 2008, @nd  Aug 12th,'11. Lives in [place]. Not sexually active     FAMILY HISTORY:  We obtained a detailed, 4-generation family history.  Significant diagnoses are listed below: Family History  Problem Relation Age of Onset  . Diabetes Mother   . Breast cancer Mother 49       dx late 62's, was in remission but had relapse with mets. Died at 64  . Heart attack Father   . Seizures Father   . Heart disease Father        CAD/MI-fatal  . Breast cancer Sister 45       dx early 27's, BRCA2+, now is 75  . Breast cancer Sister 47       dx.  in 49's, BRCA2-,   . Cancer Maternal Grandfather        Lung Cancer  . Breast cancer Maternal Grandfather   . Prostate cancer Maternal Uncle 21       dx and died in his 69's  . Colon cancer Maternal Aunt 23       dx in 38's, had 7 in of colon removed, died in her 41's  . Alzheimer's disease Paternal Aunt   . Breast cancer Other 35       BRCA2 status unk (did not share result with famiy)  . Kidney cancer Other 35       dx in 04/02/2022, is no in his 64's  . Breast cancer Cousin 58  . Pancreatic cancer Cousin 59       died in 24's  . Lung cancer Maternal Uncle 02-Apr-2068       dx 60's/70's, now is 88  . Stomach cancer Cousin 40       died at 39  . Cancer Cousin 25       dx and died in 04-02-2022. type of cancer unk  . Throat cancer Cousin 55       died in 85's  . Cancer Cousin        dx age unk, died in 47's/60's. type of CA unk  . Cancer Cousin        dx age unk, died in 29's. type of CA unk  . Cancer Cousin        age dx unk, died in 79's   Anna Cooper has a 75 year-old daughter who has no history of cancer.  This daughter has 3 sons (ages 38,6, and 1).  Anna Cooper also has a 65 year-old son with no children or any history of cancer. Anna Cooper has 4 brothers and 2 sisters listed below: -1 brother is in his 12's and has 2 sons.  No history of cancer for these relatives. -1 brother is in his 81's and has a daughter in her 75's.  No history of cancer.  -1 brother is 57 and has no history of cancer.  He has a son in his 74's who was diagnosed with kidney cancer in his 43's.  This brother has other children, but no information is known about them.  -1 brother is 80 with no history of cancer. He has a daughter in her 11's who was diagnosed with breast cancer in her 15's.  She had genetic testing, but has chosen not to share her results with her family.   -1 sister is 52 and was diagnosed with breast cancer in her early 12's.  She had genetic testing that  revealed a BRCA2 mutation.  She has 2 daughters in  their 2's who are BRCA2 negative and have no history of cancer.  -1 sister is 31 and was diagnosed with breast cancer in her 80's.  She had genetic testing for BRCA2 and was negative.  The patient thinks her testing was one many years ago (10+)when she was diagnosed with breast cancer. She has a son and a daughter in their 40's with no history of cancer.   Anna Cooper father died at 23 and had CAD, a heart attack, and an ulcerated stomach.  Anna Cooper has 4 paternal uncles and 2 paternal aunts listed below: -1 paternal aunt died in her 53's/70's and had no children or any history of cancer. -1 paternal aunt died in her 57's and had Alzheimer's disease.  He had 3 daughters who all died from cancer.  One had throat cancer in her 98's and died in her 28's.  The type of cancer the other cousins had is unknown but they died in their 59's, 95's, and 58's.   -1 paternal uncle died in his 72's/70's.  He had a daughter who died in her 61's.  No known history of cancer.  -the other 3 paternal uncles all died in their 33's and 50's and had no history of cancer.  They had children, but no known history of cancer for those relatives.  Anna Cooper paternal grandfather died in his 79's due to a stroke. Anna Cooper paternal grandmother died at 64 and had an enlarged heart.  She had a brother who died in his 97's and had an unknown type of cancer.   Anna Cooper mother was diagnosed with breast cancer in her late 105's.  She died at 19 and did not have any genetic testing.  Anna Cooper has 1 maternal aunt and 3 maternal uncles listed below: -1 maternal aunt was diagnosed with colon cancer in her 78's.  She had 12 inches of her colon removed and died in her 33's.  She had 4 children: 1 daughter is in her 21's and had breast cancer in her 33's.  (this cousin has 2 sons), 1 son died from pancreatic cancer in his 76's. (he had a daughter with no cancer hx),  2 other children with no history of cancer.  -1 maternal uncle  died in his 64's due to prostate cancer.  He had a son who is in his 33's with no history of cancer.   -1 maternal uncle had lung cancer in his 82's/70's and is now 34.  He had a son who died from cancer in his 78's.  The type of cancer is unknown.   -1 maternal uncle died at 13 and had no history of cancer.  He had 2 sons who both died in their 55's and 90's.  1 son died from stomach cancer dx at 7.  The other son's cause of death is unknown.   Anna Cooper maternal grandfather died from breast cancer in his 64's.  He had a sister who died in her 9's due to breast cancer.  Anna Cooper maternal grandmother died in her 85's and ha no history of cancer.  She had sisters with no known history of cancer.     Anna Cooper is aware of previous family history of genetic testing for hereditary cancer risks.  2 of her sisters have had genetic testing and one is BRCA2 positive.  Anna Cooper was unable to attain a copy of their test report  but will  continue to try getting this report. Patient's maternal ancestors are of African [place] descent, and paternal ancestors are of Native [place], Caucasian, and African American descent. There is no known Ashkenazi Jewish ancestry. There is no known consanguinity.  GENETIC COUNSELING ASSESSMENT: Anna Cooper is a 66 y.o. female with a family history of a BRCA2 mutation (Hereditary Breast and Ovarian Cancer. We, therefore, discussed and recommended the following at today's visit.   DISCUSSION: We reviewed the characteristics, features and inheritance patterns of hereditary cancer syndromes. We also discussed genetic testing, including the appropriate family members to test, the process of testing, insurance coverage and turn-around-time for results. We discussed the implications of a negative, positive and/or variant of uncertain significant result. We recommended Anna Cooper pursue genetic testing for the [facility]. The Multi-Cancer Panel offered  by [facility] includes sequencing and/or deletion duplication testing of the following 83 genes: ALK, APC, ATM, AXIN2,BAP1,  BARD1, BLM, BMPR1A, BRCA1, BRCA2, BRIP1, CASR, CDC73, CDH1, CDK4, CDKN1B, CDKN1C, CDKN2A (p14ARF), CDKN2A (p16INK4a), CEBPA, CHEK2, CTNNA1, DICER1, DIS3L2, EGFR (c.2369C>T, p.Thr790Met variant only), EPCAM (Deletion/duplication testing only), FH, FLCN, GATA2, GPC3, GREM1 (Promoter region deletion/duplication testing only), HOXB13 (c.251G>A, p.Gly84Glu), HRAS, KIT, MAX, MEN1, MET, MITF (c.952G>A, p.Glu318Lys variant only), MLH1, MSH2, MSH3, MSH6, MUTYH, NBN, NF1, NF2, NTHL1, PALB2, PDGFRA, PHOX2B, PMS2, POLD1, POLE, POT1, PRKAR1A, PTCH1, PTEN, RAD50, RAD51C, RAD51D, RB1, RECQL4, RET, RUNX1, SDHAF2, SDHA (sequence changes only), SDHB, SDHC, SDHD, SMAD4, SMARCA4, SMARCB1, SMARCE1, STK11, SUFU, TERC, TERT, TMEM127, TP53, TSC1, TSC2, VHL, WRN and WT1.   We discussed that only 5-10% of cancers are associated with a Hereditary cancer predisposition syndrome.  Mutations in the BRCA2 gene cause a syndrome called Hereditary Breast and Ovarian Cancer (HBOC)  This syndrome increases an individual's lifetime risk to develop breast, ovarian, pancreatic,prostate, and other types of cancer.  There are also many other cancer predisposition syndromes caused by mutations in several other genes.  We discussed that if she is found to have a mutation in a cancer predisposition gene such as BRCA2, it may impact future medical management recommendations such as increased cancer screenings and consideration of risk reducing surgeries.  A positive result could also have implications for the patient's children and family members.  A Negative result would mean we do not believe she has the familial BRCA2 mutation (although her sister's test report would be helpful to confirm this).  It would also mean we were were unable to identify mutation in another cancer predisposition gene. It would be very reassuring for her,  however, it does not rule out the possibility of a hereditary cancer risk.  There could be other mutations in the family that are undetectable by current technology, or in genes not yet tested or identified to increase cancer risk.    We discussed that her sister who was diagnosed with breast cancer in her 5's was BRCA2 negative, and therefore we do not have an explanation for her cancer diagnosis.  Additionally there are many relatives affected with cancer who never had genetic testing, so while those cancers can likely be explained by the familial BRCA2 mutation, there could be another hereditary factor or reason for some of the family history of cancer.    We discussed the potential to find a Variant of Uncertain Significance or VUS.  These are variants that have not yet been identified as pathogenic or benign, and it is unknown if this variant is associated with increased cancer risk or if this is  a normal finding.  Most VUS's are reclassified to benign or likely benign.   It should not be used to make medical management decisions. With time, we suspect the lab will determine the significance of any VUS's identified if any.   Based on Anna Cooper. Tangen's family history of cancer, she meets medical criteria for genetic testing. Despite that she meets criteria, she may still have an out of pocket cost. We discussed that if her out of pocket cost for testing is over $100, the laboratory will call and confirm whether she wants to proceed with testing.  If the out of pocket cost of testing is less than $100 she will be billed by the genetic testing laboratory.    PLAN: After considering the risks, benefits, and limitations, Anna Cooper. Gambino  provided informed consent to pursue genetic testing and the blood sample was sent to [facility] for analysis of the Multi-Cancer Panel. Results should be available within approximately 2-3 weeks' time, at which point they will be disclosed by telephone to Anna Cooper. Segler, as  will any additional recommendations warranted by these results. Anna Cooper. Salce will receive a summary of her genetic counseling visit and a copy of her results once available. This information will also be available in Epic. We encouraged Anna Cooper. Grandpre to remain in contact with cancer genetics annually so that we can continuously update the family history and inform her of any changes in cancer genetics and testing that may be of benefit for her family. Anna Cooper. Kaczynski questions were answered to her satisfaction today. Our contact information was provided should additional questions or concerns arise.  Based on Anna Cooper. Kirschenbaum's family history and the known familial mutation, we recommended her brothers and other maternal relatives have genetic counseling and genetic testing. Anna Cooper. Cozzolino will let us know if we can be of any assistance in coordinating genetic counseling and/or testing for this family member.   Lastly, we encouraged Anna Cooper. Rebman to remain in contact with cancer genetics annually so that we can continuously update the family history and inform her of any changes in cancer genetics and testing that may be of benefit for this family.   Anna Cooper.  Heatwole questions were answered to her satisfaction today. Our contact information was provided should additional questions or concerns arise. Thank you for the referral and allowing [place] to share in the care of your patient.   Tana Felts, Anna Cooper Genetic Counselor [email or web address] phone: [phone]  The patient was seen for a total of 40 minutes in face-to-face genetic counseling.

## 2016-08-21 ENCOUNTER — Encounter: Payer: Self-pay | Admitting: Genetics

## 2016-08-21 DIAGNOSIS — Z8042 Family history of malignant neoplasm of prostate: Secondary | ICD-10-CM | POA: Insufficient documentation

## 2016-08-21 DIAGNOSIS — Z803 Family history of malignant neoplasm of breast: Secondary | ICD-10-CM | POA: Insufficient documentation

## 2016-08-21 DIAGNOSIS — Z800 Family history of malignant neoplasm of digestive organs: Secondary | ICD-10-CM | POA: Insufficient documentation

## 2016-08-21 DIAGNOSIS — Z8481 Family history of carrier of genetic disease: Secondary | ICD-10-CM | POA: Insufficient documentation

## 2016-08-30 ENCOUNTER — Encounter: Payer: Self-pay | Admitting: Genetics

## 2016-08-30 ENCOUNTER — Ambulatory Visit: Payer: Self-pay | Admitting: Genetics

## 2016-08-30 ENCOUNTER — Telehealth: Payer: Self-pay | Admitting: Genetics

## 2016-08-30 DIAGNOSIS — Z8481 Family history of carrier of genetic disease: Secondary | ICD-10-CM

## 2016-08-30 DIAGNOSIS — Z8042 Family history of malignant neoplasm of prostate: Secondary | ICD-10-CM

## 2016-08-30 DIAGNOSIS — Z1379 Encounter for other screening for genetic and chromosomal anomalies: Secondary | ICD-10-CM

## 2016-08-30 DIAGNOSIS — Z803 Family history of malignant neoplasm of breast: Secondary | ICD-10-CM

## 2016-08-30 DIAGNOSIS — Z800 Family history of malignant neoplasm of digestive organs: Secondary | ICD-10-CM

## 2016-08-30 NOTE — Progress Notes (Signed)
HPI: Ms. Manz was previously seen in the [place] clinic on 08/20/2016 due to a family history of a BRCA2 mutation, family history of breast cancer, and concerns regarding a hereditary predisposition to cancer. Please refer to our prior cancer genetics clinic note for more information regarding Ms. Kapfer's medical, social and family histories, and our assessment and recommendations, at the time. Ms. Salim recent genetic test results were disclosed to her, as well as recommendations warranted by these results. These results and recommendations are discussed in more detail below.  FAMILY HISTORY:  We obtained a detailed, 4-generation family history.  Significant diagnoses are listed below: Family History  Problem Relation Age of Onset  . Diabetes Mother   . Breast cancer Mother 55       dx late 58's, was in remission but had relapse with mets. Died at 37  . Heart attack Father   . Seizures Father   . Heart disease Father        CAD/MI-fatal  . Breast cancer Sister 31       dx early 48's, BRCA2+, now is 58  . Breast cancer Sister 103       dx. in 42's, BRCA2-,   . Cancer Maternal Grandfather        Lung Cancer  . Breast cancer Maternal Grandfather   . Prostate cancer Maternal Uncle 25       dx and died in his 59's  . Colon cancer Maternal Aunt 30       dx in 77's, had 83 in of colon removed, died in her 75's  . Alzheimer's disease Paternal Aunt   . Breast cancer Other 35       BRCA2 status unk (did not share result with famiy)  . Kidney cancer Other 35       dx in 04-07-2022, is no in his 75's  . Breast cancer Cousin 28  . Pancreatic cancer Cousin 1       died in 55's  . Lung cancer Maternal Uncle 2068-04-07       dx 60's/70's, now is 6  . Stomach cancer Cousin 40       died at 32  . Cancer Cousin 25       dx and died in Apr 07, 2022. type of cancer unk  . Throat cancer Cousin 14       died in 27's  . Cancer Cousin        dx age unk, died in 34's/60's. type of CA unk  .  Cancer Cousin        dx age unk, died in 66's. type of CA unk  . Cancer Cousin        age dx unk, died in 92's   Ms. Pavlovic has a 78 year-old daughter who has no history of cancer.  This daughter has 3 sons (ages 55,6, and 1).  Ms. Brienza also has a 22 year-old son with no children or any history of cancer. Ms. Warth has 4 brothers and 2 sisters listed below: -1 brother is in his 24's and has 2 sons.  No history of cancer for these relatives. -1 brother is in his 33's and has a daughter in her 68's.  No history of cancer.  -1 brother is 28 and has no history of cancer.  He has a son in his 65's who was diagnosed with kidney cancer in his 14's.  This brother has other children, but no information is known about  them.  -1 brother is 54 with no history of cancer. He has a daughter in her 70's who was diagnosed with breast cancer in her 37's.  She had genetic testing, but has chosen not to share her results with her family.   -1 sister is 82 and was diagnosed with breast cancer in her early 70's.  She had genetic testing that revealed a BRCA2 mutation.  She has 2 daughters in their 48's who are BRCA2 negative and have no history of cancer.  -1 sister is 58 and was diagnosed with breast cancer in her 5's.  She had genetic testing for BRCA2 and was negative.  The patient thinks her testing was one many years ago (10+)when she was diagnosed with breast cancer. She has a son and a daughter in their 48's with no history of cancer.   Ms. Vandivier father died at 59 and had CAD, a heart attack, and an ulcerated stomach.  Ms. Wheeling has 4 paternal uncles and 2 paternal aunts listed below: -1 paternal aunt died in her 32's/70's and had no children or any history of cancer. -1 paternal aunt died in her 17's and had Alzheimer's disease.  He had 3 daughters who all died from cancer.  One had throat cancer in her 15's and died in her 74's.  The type of cancer the other cousins had is unknown but they died in their  74's, 42's, and 28's.   -1 paternal uncle died in his 39's/70's.  He had a daughter who died in her 32's.  No known history of cancer.  -the other 3 paternal uncles all died in their 52's and 41's and had no history of cancer.  They had children, but no known history of cancer for those relatives.  Ms. Devivo paternal grandfather died in his 60's due to a stroke. Ms. Narayan paternal grandmother died at 40 and had an enlarged heart.  She had a brother who died in his 71's and had an unknown type of cancer.   Ms. Wickes mother was diagnosed with breast cancer in her late 70's.  She died at 57 and did not have any genetic testing.  Ms. Asebedo has 1 maternal aunt and 3 maternal uncles listed below: -1 maternal aunt was diagnosed with colon cancer in her 52's.  She had 12 inches of her colon removed and died in her 96's.  She had 4 children: 1 daughter is in her 49's and had breast cancer in her 48's.  (this cousin has 2 sons), 1 son died from pancreatic cancer in his 68's. (he had a daughter with no cancer hx),  2 other children with no history of cancer.  -1 maternal uncle died in his 64's due to prostate cancer.  He had a son who is in his 18's with no history of cancer.   -1 maternal uncle had lung cancer in his 20's/70's and is now 68.  He had a son who died from cancer in his 43's.  The type of cancer is unknown.   -1 maternal uncle died at 18 and had no history of cancer.  He had 2 sons who both died in their 31's and 71's.  1 son died from stomach cancer dx at 50.  The other son's cause of death is unknown.   Ms. Twombly maternal grandfather died from breast cancer in his 107's.  He had a sister who died in her 43's due to breast cancer.  Ms. Aaronson maternal grandmother died in  her 93's and ha no history of cancer.  She had sisters with no known history of cancer.     Ms. Diesing is aware of previous family history of genetic testing for hereditary cancer risks.  2 of her sisters have had  genetic testing and one is BRCA2 positive.  Ms. [place] was unable to attain a copy of their test report but will  continue to try getting this report. Patient's maternal ancestors are of African [place] descent, and paternal ancestors are of Native [place], Caucasian, and African American descent. There is no known Ashkenazi Jewish ancestry. There is no known consanguinity  GENETIC TEST RESULTS: Genetic testing performed through [facility]'s  Multi-Cancer panel reported out on 08/28/2016 showed no pathogenic mutations. The Multi-Cancer Panel offered by [facility] includes sequencing and/or deletion duplication testing of the following 83 genes: ALK, APC, ATM, AXIN2,BAP1,  BARD1, BLM, BMPR1A, BRCA1, BRCA2, BRIP1, CASR, CDC73, CDH1, CDK4, CDKN1B, CDKN1C, CDKN2A (p14ARF), CDKN2A (p16INK4a), CEBPA, CHEK2, CTNNA1, DICER1, DIS3L2, EGFR (c.2369C>T, p.Thr790Met variant only), EPCAM (Deletion/duplication testing only), FH, FLCN, GATA2, GPC3, GREM1 (Promoter region deletion/duplication testing only), HOXB13 (c.251G>A, p.Gly84Glu), HRAS, KIT, MAX, MEN1, MET, MITF (c.952G>A, p.Glu318Lys variant only), MLH1, MSH2, MSH3, MSH6, MUTYH, NBN, NF1, NF2, NTHL1, PALB2, PDGFRA, PHOX2B, PMS2, POLD1, POLE, POT1, PRKAR1A, PTCH1, PTEN, RAD50, RAD51C, RAD51D, RB1, RECQL4, RET, RUNX1, SDHAF2, SDHA (sequence changes only), SDHB, SDHC, SDHD, SMAD4, SMARCA4, SMARCB1, SMARCE1, STK11, SUFU, TERC, TERT, TMEM127, TP53, TSC1, TSC2, VHL, WRN and WT1. .  2 variants of uncertain significance (VUS) in 2 genes called KITc.1879C>T  and PDGFRA c.1436G>A were also noted.  The test report will be scanned into EPIC and will be located under the Molecular Pathology section of the Results Review tab.A portion of the result report is included below for reference.      We discussed with Ms. Carbo that because current genetic testing is not perfect, it is possible there may be a gene mutation in one of these genes that current testing cannot detect, but that  chance is small. We also discussed, that there could be another gene that has not yet been discovered, or that we have not yet tested, that is responsible for the cancer diagnoses in the family. Therefore, it is important to remain in touch with cancer genetics in the future so that we can continue to offer Ms. Barnick the most up to date genetic testing.   While Ms. Guadamuz's sister's test results were unavailable for review, MS. Paden's test did not reveal a BRCA2 mutation.  Therefore we do not believe she carries the familial BRCA2 mutation.   We call this result a true negative result because the cancer-causing mutation was identified in Ms. Mccalip's family, and she did not inherit it.  However, not all of the cancer in the family can be explained by a familial BRCA2 mutation because her sister who is reportedly BRCA2 negative still developed breast cancer in her 5's.  Therefore, there may still be some increased risk for cancer in this family that is not related to the known BRCA2 mutation.   Regarding the VUS's in KIT and PDGFRA: At this time, it is unknown if this variant is associated with increased cancer risk or if this is a normal finding, but most variants such as this get reclassified to being inconsequential. It should not be used to make medical management decisions. With time, we suspect the lab will determine the significance of this variant, if any. If we do learn more about it, we will  try to contact Ms. Lefeber to discuss it further. However, it is important to stay in touch with [place] periodically and keep the address and phone number up to date.    ADDITIONAL GENETIC TESTING: We discussed with Ms. Vallie , her genetic testing was fairly extensive.  If there are any genes that are identified to cause cancer risk and can be tested for in the future,  we are happy to discuss and coordinate this testing at that time.     CANCER SCREENING RECOMMENDATIONS: Ms. Remmel did not have a BRCA2  mutation detected.  Therefore her risk for developing cancer is much less than her relatives who do carry a BRCA2 mutation.  However, as discussed above her sister is reportedly BRCA2 negative and had breast cancer in her 18's.  Therefore, MS. Yorks may still have an increased risk for breast cancer (unrelated to BRCA2 negative status).   Based on the Ms. Pfost's personal history (hormonal, biopsy hx, etc.) and family history of cancer, as well as her genetic test results, the statistical model  (Tyrer cusik)was used to estimate her risk of developing breast cancer. This estimates her lifetime risk of developing breast cancer to be approximately 22.6% The patient's lifetime brest cancer risk is a preliminary estimate based on available information using one of several models endorsed by the [place] (ACS). The ACS recommends consideration of breast MRI screening as an adjunct to mammography for patients at high risk (defined as 20% or greater lifetime risk). A more detailed breast cancer risk assessment can be considered, if clinically indicated.   Ms. Clippinger has been determined to be at high risk for breast cancer.  Therefore, we recommend that annual screening with mammography and breast MRI begin at age 10, or 10 years prior to the age of breast cancer diagnosis in a relative (whichever is earlier).  We discussed that Ms. Brodbeck should discuss her situation with her referring physician and determine a breast cancer screening plan for her.      RECOMMENDATIONS FOR FAMILY MEMBERS: Indivisuals in this family might be at some increased risk of developing cancer, over the general population risk, simply due to the family history of cancer.   We recommended all individuals in this family have genetic testing to determine if they carry the BRCA2 mutation.  Based on their results, cancer screening recommendations can be made. While the BRCA2 mutation appears to be coming from the maternal  side of the family relatives on both sides should consider genetic testing until the mutation can be confirmed on either the maternal or paternal sides of the family.   Individuals in this family may still be at a increased risk for developing cancer (even if they are BRCA2 negative) simply due to the family history of cancer in people who are BRCA2 neg. Her relatives should discuss their family history of cancer and a BRCA2 mutation with their physicians to determine their individual risks and screening recommendations.  FOLLOW-UP: Lastly, we discussed with Ms. Comacho that cancer genetics is a rapidly advancing field and it is possible that new genetic tests will be appropriate for her and/or her family members in the future. We encouraged her to remain in contact with cancer genetics on an annual basis so we can update her personal and family histories and let her know of advances in cancer genetics that may benefit this family.   Our contact number was provided. Ms. Siverson questions were answered to her satisfaction, and she knows she  is welcome to call us at anytime with additional questions or concerns.   Ferol Luz, MS Genetic Counselor [email or web address]

## 2016-08-30 NOTE — Telephone Encounter (Signed)
Revealed negative genetic testing.  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  Discussed that she does not have a BRCA2 mutation detected so we do not think she has the family  Mutation (although her sister's test report is unavilable for review to confirm).  Most of the cancer in the family can likely be explained by the BRCA2 mutation, however, her sister who was BRCA2 negative had breast cancer in her 56's so there is still an increased risk for cancer simply based on family history.  It will be important for her to keep in contact with genetics to keep up with whether additional testing may be needed.

## 2016-09-12 ENCOUNTER — Other Ambulatory Visit: Payer: Self-pay | Admitting: Obstetrics and Gynecology

## 2016-09-12 DIAGNOSIS — Z803 Family history of malignant neoplasm of breast: Secondary | ICD-10-CM

## 2016-09-26 ENCOUNTER — Ambulatory Visit
Admission: RE | Admit: 2016-09-26 | Discharge: 2016-09-26 | Disposition: A | Discharge status: Home / Self Care | Payer: Medicare Other | Source: Ambulatory Visit | Attending: Obstetrics and Gynecology | Admitting: Obstetrics and Gynecology

## 2016-09-26 DIAGNOSIS — Z803 Family history of malignant neoplasm of breast: Secondary | ICD-10-CM

## 2016-09-26 MED ORDER — GADOBENATE DIMEGLUMINE 529 MG/ML IV SOLN
14.0000 mL | Freq: Once | INTRAVENOUS | Status: AC | PRN
  Administered 2016-09-26: 14 mL via INTRAVENOUS

## 2016-10-11 ENCOUNTER — Other Ambulatory Visit (INDEPENDENT_AMBULATORY_CARE_PROVIDER_SITE_OTHER): Payer: Medicare Other

## 2016-10-11 ENCOUNTER — Telehealth: Payer: Self-pay | Admitting: Internal Medicine

## 2016-10-11 ENCOUNTER — Encounter: Payer: Self-pay | Admitting: Internal Medicine

## 2016-10-11 ENCOUNTER — Ambulatory Visit (INDEPENDENT_AMBULATORY_CARE_PROVIDER_SITE_OTHER): Payer: Medicare Other | Admitting: Internal Medicine

## 2016-10-11 VITALS — BP 100/70 | HR 58 | Temp 98.2°F | Ht 64.5 in | Wt 176.0 lb

## 2016-10-11 DIAGNOSIS — I10 Essential (primary) hypertension: Secondary | ICD-10-CM | POA: Diagnosis not present

## 2016-10-11 DIAGNOSIS — E785 Hyperlipidemia, unspecified: Secondary | ICD-10-CM

## 2016-10-11 DIAGNOSIS — Z0000 Encounter for general adult medical examination without abnormal findings: Secondary | ICD-10-CM | POA: Diagnosis not present

## 2016-10-11 DIAGNOSIS — Z23 Encounter for immunization: Secondary | ICD-10-CM | POA: Diagnosis not present

## 2016-10-11 DIAGNOSIS — E119 Type 2 diabetes mellitus without complications: Secondary | ICD-10-CM

## 2016-10-11 LAB — LIPID PANEL
CHOLESTEROL: 139 mg/dL (ref 0–200)
HDL: 44.5 mg/dL (ref >39.00)
LDL CALC: 66 mg/dL (ref 0–99)
NonHDL: 94.06
TRIGLYCERIDES: 139 mg/dL (ref 0.0–149.0)
Total CHOL/HDL Ratio: 3
VLDL: 27.8 mg/dL (ref 0.0–40.0)

## 2016-10-11 LAB — CBC
HEMATOCRIT: 41.7 % (ref 36.0–46.0)
Hemoglobin: 13.5 g/dL (ref 12.0–15.0)
MCHC: 32.4 g/dL (ref 30.0–36.0)
MCV: 86.2 fl (ref 78.0–100.0)
Platelets: 333 10*3/uL (ref 150.0–400.0)
RBC: 4.84 Mil/uL (ref 3.87–5.11)
RDW: 14.6 % (ref 11.5–15.5)
WBC: 5.6 10*3/uL (ref 4.0–10.5)

## 2016-10-11 LAB — T4, FREE: Free T4: 0.81 ng/dL (ref 0.60–1.60)

## 2016-10-11 LAB — COMPREHENSIVE METABOLIC PANEL
ALBUMIN: 4.2 g/dL (ref 3.5–5.2)
ALK PHOS: 103 U/L (ref 39–117)
ALT: 18 U/L (ref 0–35)
AST: 19 U/L (ref 0–37)
BILIRUBIN TOTAL: 0.4 mg/dL (ref 0.2–1.2)
BUN: 7 mg/dL (ref 6–23)
CALCIUM: 10 mg/dL (ref 8.4–10.5)
CHLORIDE: 100 meq/L (ref 96–112)
CO2: 31 mEq/L (ref 19–32)
CREATININE: 0.77 mg/dL (ref 0.40–1.20)
GFR: 96.03 mL/min (ref >60.00)
Glucose, Bld: 120 mg/dL — ABNORMAL HIGH (ref 70–99)
Potassium: 3.6 mEq/L (ref 3.5–5.1)
Sodium: 139 mEq/L (ref 135–145)
TOTAL PROTEIN: 7.4 g/dL (ref 6.0–8.3)

## 2016-10-11 LAB — MICROALBUMIN / CREATININE URINE RATIO
CREATININE, U: 161.1 mg/dL
Microalb Creat Ratio: 0.4 mg/g (ref 0.0–30.0)
Microalb, Ur: 0.7 mg/dL (ref 0.0–1.9)

## 2016-10-11 LAB — HEMOGLOBIN A1C: HEMOGLOBIN A1C: 7 % — AB (ref 4.6–6.5)

## 2016-10-11 LAB — TSH: TSH: 1.25 u[IU]/mL (ref 0.35–4.50)

## 2016-10-11 MED ORDER — POTASSIUM CHLORIDE CRYS ER 10 MEQ PO TBCR: 10.0000 meq | EXTENDED_RELEASE_TABLET | Freq: Every day | ORAL | 0 refills | Status: DC

## 2016-10-11 MED ORDER — EZETIMIBE-SIMVASTATIN 10-20 MG PO TABS: 1.0000 | ORAL_TABLET | Freq: Every day | ORAL | 0 refills | Status: DC

## 2016-10-11 MED ORDER — AMLODIPINE BESYLATE 10 MG PO TABS: 10.0000 mg | ORAL_TABLET | Freq: Every day | ORAL | 0 refills | Status: DC

## 2016-10-11 MED ORDER — HYDROCHLOROTHIAZIDE 12.5 MG PO CAPS: 12.5000 mg | ORAL_CAPSULE | Freq: Every day | ORAL | 0 refills | Status: DC

## 2016-10-11 NOTE — Progress Notes (Signed)
Pre visit review using our clinic review tool, if applicable. No additional management support is needed unless otherwise documented below in the visit note. 

## 2016-10-11 NOTE — Progress Notes (Signed)
Subjective:    Patient ID: Anna Cooper, female    DOB: [birth year], 66 y.o.   MRN: [id number]  HPI  The patient is a 66 YO female coming in for physical. No new concerns.   Dr. Selena Batten at [facility] does her eye exams which are annual.  PMH, [facility], social history reviewed and updated.   Review of Systems  Constitutional: Negative.   HENT: Negative.   Eyes: Negative.   Respiratory: Negative for cough, chest tightness and shortness of breath.   Cardiovascular: Negative for chest pain, palpitations and leg swelling.  Gastrointestinal: Negative for abdominal distention, abdominal pain, constipation, diarrhea, nausea and vomiting.  Musculoskeletal: Negative.   Skin: Negative.   Neurological: Negative.   Psychiatric/Behavioral: Negative.       Objective:   Physical Exam  Constitutional: She is oriented to person, place, and time. She appears well-developed and well-nourished.  HENT:  Head: Normocephalic and atraumatic.  Eyes: EOM are normal.  Neck: Normal range of motion.  Cardiovascular: Normal rate and regular rhythm.   Pulmonary/Chest: Effort normal and breath sounds normal. No respiratory distress. She has no wheezes. She has no rales.  Abdominal: Soft. Bowel sounds are normal. She exhibits no distension. There is no tenderness. There is no rebound.  Musculoskeletal: She exhibits no edema.  Neurological: She is alert and oriented to person, place, and time. Coordination normal.  Skin: Skin is warm and dry.  Psychiatric: She has a normal mood and affect.   Vitals:   10/11/16 0904  BP: 100/70  Pulse: (!) 58  Temp: 98.2 F (36.8 C)  TempSrc: Oral  SpO2: 98%  Weight: 176 lb (79.8 kg)  Height: 5' 4.5" (1.638 m)      Assessment & Plan:  Flu shot given at visit.

## 2016-10-11 NOTE — Progress Notes (Signed)
Subjective:   Anna Cooper is a 66 y.o. female who presents for Medicare Annual (Subsequent) preventive examination.  Review of Systems:  No ROS.  Medicare Wellness Visit. Additional risk factors are reflected in the social history.  Cardiac Risk Factors include: advanced age (>15mn, >>22women);diabetes mellitus;hypertension Sleep patterns: does not get up to void, gets up 1 times nightly to void and sleeps 5-6 hours nightly.  Patient reports insomnia issues, discussed recommended sleep tips and stress reduction tips.   Home Safety/Smoke Alarms: Feels safe in home. Smoke alarms in place.  Living environment; residence and Firearm Safety: 2-story house, no firearms. Lives alone, no needs for DME, good support system Seat Belt Safety/Bike Helmet: Wears seat belt.       Objective:     Vitals: BP 100/70 (BP Location: Left Arm, Patient Position: Sitting, Cuff Size: Normal)   Pulse (!) 58   Temp 98.2 F (36.8 C) (Oral)   Ht 5' 4.5" (1.638 m)   Wt 176 lb (79.8 kg)   SpO2 98%   BMI 29.74 kg/m   Body mass index is 29.74 kg/m.   Tobacco History  Smoking Status  . Never Smoker  Smokeless Tobacco  . Never Used     Counseling given: Not Answered   Past Medical History:  Diagnosis Date  . Family history of breast cancer   . Family history of colon cancer   . Family history of genetic disease carrier    sister has BRCA2 mutation  . Family history of pancreatic cancer   . Family history of prostate cancer   . Family history of stomach cancer   . Fuchs' corneal dystrophy    s/p  bilateral corneal transplant [facility]-- right 09-24-2011 and left 02-11-2012  . History of recurrent UTIs   . History of venereal warts   . Hyperlipidemia   . Hypertension   . Infantile paralysis   . Major depression   . PMB (postmenopausal bleeding)   . [place]   not on meds currently   Past Surgical History:  Procedure Laterality Date  . BUNIONECTOMY  WITH HAMMERTOE RECONSTRUCTION Bilateral right 07/ 2013;  left 11/ 2013  . 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 COLONOSCOPY    . DILATATION & CURETTAGE/HYSTEROSCOPY WITH MYOSURE N/A 06/24/2016   Procedure: DILATATION & CURETTAGE/HYSTEROSCOPY WITH MYOSURE;  Surgeon: LTyson Dense MD;  [facility]  Service: Gynecology;  Laterality: N/A;  . TUBAL LIGATION  1980's   Family History  Problem Relation Age of Onset  . Diabetes Mother   . Breast cancer Mother 654      dx late 620's was in remission but had relapse with mets. Died at 844 . Heart attack Father   . Seizures Father   . Heart disease Father        CAD/MI-fatal  . Breast cancer Sister 680      dx early 689's BRCA2+, now is 734 . Breast cancer Sister 441      dx. in 473's BRCA2-,   . Cancer Maternal Grandfather        Lung Cancer  . Breast cancer Maternal Grandfather   . Prostate cancer Maternal Uncle 712      dx and died in his 727's .  Colon cancer Maternal Aunt 43       dx in 73's, had 24 in of colon removed, died in her 64's  . Alzheimer's disease Paternal Aunt   . Breast cancer Other 35       BRCA2 status unk (did not share result with famiy)  . Kidney cancer Other 35       dx in 2022/03/23, is no in his 51's  . Breast cancer Cousin 30  . Pancreatic cancer Cousin 51       died in 91's  . Lung cancer Maternal Uncle Mar 22, 2068       dx 60's/70's, now is 45  . Stomach cancer Cousin 40       died at 76  . Cancer Cousin 25       dx and died in 2022/03/23. type of cancer unk  . Throat cancer Cousin 16       died in 3's  . Cancer Cousin        dx age unk, died in 26's/60's. type of CA unk  . Cancer Cousin        dx age unk, died in 38's. type of CA unk  . Cancer Cousin        age dx unk, died in 29's   History  Sexual Activity  . Sexual activity: Not on file      Outpatient Encounter Prescriptions as of 10/11/2016  Medication Sig  . amLODipine (NORVASC) 10 MG tablet TAKE 1 TABLET BY MOUTH  EVERY DAY  . Cholecalciferol (VITAMIN D3) 1000 UNITS CAPS Take by mouth. Take one tablets by mouth once daily.  . hydrochlorothiazide (MICROZIDE) 12.5 MG capsule TAKE 1 CAPSULE BY MOUTH  ONCE DAILY  . Multiple Vitamins-Minerals (CENTRUM SILVER ADULT 50+ PO) Take by mouth.  . potassium chloride (K-DUR,KLOR-CON) 10 MEQ tablet TAKE 1 TABLET BY MOUTH  DAILY.  Marland Kitchen prednisoLONE acetate (PRED FORTE) 1 % ophthalmic suspension Place 1 drop into the right eye 2 (two) times daily. (Patient taking differently: Place 1 drop into both eyes daily. )  . VYTORIN 10-20 MG tablet TAKE 1 TABLET BY MOUTH  EVERY DAY  . [DISCONTINUED] clotrimazole-betamethasone (LOTRISONE) cream Apply 1 application topically 2 (two) times daily. (Patient not taking: Reported on 07/15/2016)  . [DISCONTINUED] valACYclovir (VALTREX) 1000 MG tablet TAKE 1 TABLET BY MOUTH 2  TIMES DAILY. DURING FLARE (Patient not taking: Reported on 10/11/2016)  . [DISCONTINUED] 0.9 %  sodium chloride infusion    No facility-administered encounter medications on file as of 10/11/2016.     Activities of Daily Living In your present state of health, do you have any difficulty performing the following activities: 10/11/2016 06/24/2016  Hearing? N N  Vision? N N  Difficulty concentrating or making decisions? N N  Walking or climbing stairs? N N  Dressing or bathing? N N  Doing errands, shopping? N -  Preparing Food and eating ? N -  Using the Toilet? N -  In the past six months, have you accidently leaked urine? N -  Do you have problems with loss of bowel control? N -  Managing your Medications? N -  Managing your Finances? N -  Housekeeping or managing your Housekeeping? N -  Some recent data might be hidden    Patient Care Team: Hoyt Koch, MD as PCP - General (Internal Medicine) Newt Minion, MD as  Consulting Physician (Orthopedic Surgery) Marylynn Pearson, MD as Consulting Physician (Ophthalmology)    Assessment:  Physical assessment deferred to PCP.  Exercise Activities and Dietary recommendations Current Exercise Habits: Home exercise routine, Type of exercise: walking (yard work), Time (Minutes): 35, Frequency (Times/Week): 2, Weekly Exercise (Minutes/Week): 70, Intensity: Mild, Exercise limited by: None identified  Goals    . Exercise 150 minutes per week (moderate activity)          [facility]  Class  Silver sneaker  Walks prior to her class     . lose weight          Think about increasing my physical activity and continue to monitor diet.      Fall Risk Fall Risk  10/11/2016 08/29/2015 12/01/2014 11/23/2014 11/15/2014  Falls in the past year? No No No No No   Depression Screen PHQ 2/9 Scores 10/11/2016 10/11/2016 08/29/2015 12/01/2014  PHQ - 2 Score 1 1 0 0  PHQ- 9 Score 3 - - -     Cognitive Function MMSE - Mini Mental State Exam 08/29/2015  Orientation to time 5  Orientation to Place 5  Registration 3  Attention/ Calculation 5  Recall 3  Language- name 2 objects 2  Language- repeat 1  Language- follow 3 step command 3  Language- read & follow direction 1  Write a sentence 1  Copy design 1  Total score 30       Ad8 score reviewed for issues:  Issues making decisions: no  Less interest in hobbies / activities: no  Repeats questions, stories (family complaining): no  Trouble using ordinary gadgets (microwave, computer, phone):no  Forgets the month or year: no  Mismanaging finances: no  Remembering appts: no  Daily problems with thinking and/or memory: no Ad8 score is= 0  Immunization History  Administered Date(s) Administered  . Influenza Split 11/06/2011  . Influenza, High Dose Seasonal PF 09/28/2015, 10/11/2016  . Influenza,inj,Quad PF,6+ Mos 10/28/2012, 11/16/2013  . Influenza-Unspecified 10/15/2014  .  Pneumococcal Conjugate-13 01/25/2013  . Pneumococcal Polysaccharide-23 11/06/2011  . Tdap 11/11/2011  . Zoster 11/11/2011   Screening Tests Health Maintenance  Topic Date Due  . OPHTHALMOLOGY EXAM  12/29/2015  . URINE MICROALBUMIN  08/30/2016  . HEMOGLOBIN A1C  10/11/2016  . PNA vac Low Risk Adult (2 of 2 - PPSV23) 11/05/2016  . MAMMOGRAM  02/07/2017  . FOOT EXAM  10/11/2017  . TETANUS/TDAP  11/10/2021  . COLONOSCOPY  07/16/2026  . INFLUENZA VACCINE  Completed  . DEXA SCAN  Completed  . Hepatitis C Screening  Completed      Plan:     Continue doing brain stimulating activities (puzzles, reading, adult coloring books, staying active) to keep memory sharp.   Continue to eat heart healthy diet (full of fruits, vegetables, whole grains, lean protein, water--limit salt, fat, and sugar intake) and increase physical activity as tolerated.   I have personally reviewed and noted the following in the patient's chart:   . Medical and social history . Use of alcohol, tobacco or illicit drugs  . Current medications and supplements . Functional ability and status . Nutritional status . Physical activity . Advanced directives . List of other physicians . Vitals . Screenings to include cognitive, depression, and falls . Referrals and appointments  In addition, I have reviewed and discussed with patient certain preventive protocols, quality metrics, and best practice recommendations. A written personalized care plan for preventive services as well as general preventive health recommendations were provided to patient.     Michiel Cowboy, RN  10/11/2016     

## 2016-10-11 NOTE — Assessment & Plan Note (Signed)
Flu shot given at visit, pneumonia and tetanus up to date. Colonoscopy and mammogram up to date. She states eye exam up to date with Dr. Selena Batten at [facility]. Counseled about sun safety and dangers of distracted driving. Given 10 year screening recommendations.

## 2016-10-11 NOTE — Progress Notes (Signed)
Patient ID: NELMA PHAGAN, female   DOB: 07/13/1950, 66 y.o.   MRN: [id number] Medical screening examination/treatment/procedure(s) were performed by non-physician practitioner and as supervising physician I was immediately available for consultation/collaboration. I agree with above. Myrlene Broker, MD

## 2016-10-11 NOTE — Assessment & Plan Note (Signed)
Controlled on amlodipine and hctz. Checking CMP and adjust as needed.

## 2016-10-11 NOTE — Telephone Encounter (Signed)
Fine

## 2016-10-11 NOTE — Telephone Encounter (Signed)
Pt was here to see Dr Okey Dupre and Noreene Larsson. She said that she forgot to ask for refills on her medications. She is needing: VYTORIN 10-20 MG tablet amLODipine (NORVASC) 10 MG tablet hydrochlorothiazide (MICROZIDE) 12.5 MG capsule potassium chloride (K-DUR,KLOR-CON) 10 MEQ tablet

## 2016-10-11 NOTE — Telephone Encounter (Signed)
Rx,s sent to [facility]

## 2016-10-11 NOTE — Telephone Encounter (Signed)
Are you okay with me refilling these today

## 2016-10-11 NOTE — Patient Instructions (Addendum)
Think about getting the shingles vaccine called shingrix which is 2 shots at least 2 months apart to get better protection from getting shingles.   Continue doing brain stimulating activities (puzzles, reading, adult coloring books, staying active) to keep memory sharp.   Continue to eat heart healthy diet (full of fruits, vegetables, whole grains, lean protein, water--limit salt, fat, and sugar intake) and increase physical activity as tolerated.   Anna Cooper , Thank you for taking time to come for your Medicare Wellness Visit. I appreciate your ongoing commitment to your health goals. Please review the following plan we discussed and let me know if I can assist you in the future.   These are the goals we discussed: Goals    . Exercise 150 minutes per week (moderate activity)          [facility]  [facility]  Silver sneaker  Walks prior to her class     . lose weight          Think about increasing my physical activity and continue to monitor diet.       This is a list of the screening recommended for you and due dates:  Health Maintenance  Topic Date Due  . Eye exam for diabetics  12/29/2015  . Urine Protein Check  08/30/2016  . Hemoglobin A1C  10/11/2016  . Pneumonia vaccines (2 of 2 - PPSV23) 11/05/2016  . Mammogram  02/07/2017  . Complete foot exam   10/11/2017  . Tetanus Vaccine  11/10/2021  . Colon Cancer Screening  07/16/2026  . Flu Shot  Completed  . DEXA scan (bone density measurement)  Completed  .  Hepatitis C: One time screening is recommended by Center for Disease Control  (CDC) for  adults born from [birth year] through 1966.   Completed     Health Maintenance, Female Adopting a healthy lifestyle and getting preventive care can go a long way to promote health and wellness. Talk with your health care provider about what schedule of regular examinations is right for you. This is a good chance for you to check in with your provider about disease  prevention and staying healthy. In between checkups, there are plenty of things you can do on your own. Experts have done a lot of research about which lifestyle changes and preventive measures are most likely to keep you healthy. Ask your health care provider for more information. Weight and diet Eat a healthy diet  Be sure to include plenty of vegetables, fruits, low-fat dairy products, and lean protein.  Do not eat a lot of foods high in solid fats, added sugars, or salt.  Get regular exercise. This is one of the most important things you can do for your health. ? Most adults should exercise for at least 150 minutes each week. The exercise should increase your heart rate and make you sweat (moderate-intensity exercise). ? Most adults should also do strengthening exercises at least twice a week. This is in addition to the moderate-intensity exercise.  Maintain a healthy weight  Body mass index (BMI) is a measurement that can be used to identify possible weight problems. It estimates body fat based on height and weight. Your health care provider can help determine your BMI and help you achieve or maintain a healthy weight.  For females 47 years of age and older: ? A BMI below 18.5 is considered underweight. ? A BMI of 18.5 to 24.9 is normal. ? A  BMI of 25 to 29.9 is considered overweight. ? A BMI of 30 and above is considered obese.  Watch levels of cholesterol and blood lipids  You should start having your blood tested for lipids and cholesterol at 67 years of age, then have this test every 5 years.  You may need to have your cholesterol levels checked more often if: ? Your lipid or cholesterol levels are high. ? You are older than 67 years of age. ? You are at high risk for heart disease.  Cancer screening Lung Cancer  Lung cancer screening is recommended for adults 22-54 years old who are at high risk for lung cancer because of a history of smoking.  A yearly low-dose CT scan  of the lungs is recommended for people who: ? Currently smoke. ? Have quit within the past 15 years. ? Have at least a 30-pack-year history of smoking. A pack year is smoking an average of one pack of cigarettes a day for 1 year.  Yearly screening should continue until it has been 15 years since you quit.  Yearly screening should stop if you develop a health problem that would prevent you from having lung cancer treatment.  Breast Cancer  Practice breast self-awareness. This means understanding how your breasts normally appear and feel.  It also means doing regular breast self-exams. Let your health care provider know about any changes, no matter how small.  If you are in your 20s or 30s, you should have a clinical breast exam (CBE) by a health care provider every 1-3 years as part of a regular health exam.  If you are 73 or older, have a CBE every year. Also consider having a breast X-ray (mammogram) every year.  If you have a family history of breast cancer, talk to your health care provider about genetic screening.  If you are at high risk for breast cancer, talk to your health care provider about having an MRI and a mammogram every year.  Breast cancer gene (BRCA) assessment is recommended for women who have family members with BRCA-related cancers. BRCA-related cancers include: ? Breast. ? Ovarian. ? Tubal. ? Peritoneal cancers.  Results of the assessment will determine the need for genetic counseling and BRCA1 and BRCA2 testing.  Cervical Cancer Your health care provider may recommend that you be screened regularly for cancer of the pelvic organs (ovaries, uterus, and vagina). This screening involves a pelvic examination, including checking for microscopic changes to the surface of your cervix (Pap test). You may be encouraged to have this screening done every 3 years, beginning at age 70.  For women ages 67-65, health care providers may recommend pelvic exams and Pap testing  every 3 years, or they may recommend the Pap and pelvic exam, combined with testing for human papilloma virus (HPV), every 5 years. Some types of HPV increase your risk of cervical cancer. Testing for HPV may also be done on women of any age with unclear Pap test results.  Other health care providers may not recommend any screening for nonpregnant women who are considered low risk for pelvic cancer and who do not have symptoms. Ask your health care provider if a screening pelvic exam is right for you.  If you have had past treatment for cervical cancer or a condition that could lead to cancer, you need Pap tests and screening for cancer for at least 20 years after your treatment. If Pap tests have been discontinued, your risk factors (such as having a new  sexual partner) need to be reassessed to determine if screening should resume. Some women have medical problems that increase the chance of getting cervical cancer. In these cases, your health care provider may recommend more frequent screening and Pap tests.  Colorectal Cancer  This type of cancer can be detected and often prevented.  Routine colorectal cancer screening usually begins at 67 years of age and continues through 67 years of age.  Your health care provider may recommend screening at an earlier age if you have risk factors for colon cancer.  Your health care provider may also recommend using home test kits to check for hidden blood in the stool.  A small camera at the end of a tube can be used to examine your colon directly (sigmoidoscopy or colonoscopy). This is done to check for the earliest forms of colorectal cancer.  Routine screening usually begins at age 36.  Direct examination of the colon should be repeated every 5-10 years through 67 years of age. However, you may need to be screened more often if early forms of precancerous polyps or small growths are found.  Skin Cancer  Check your skin from head to toe  regularly.  Tell your health care provider about any new moles or changes in moles, especially if there is a change in a mole's shape or color.  Also tell your health care provider if you have a mole that is larger than the size of a pencil eraser.  Always use sunscreen. Apply sunscreen liberally and repeatedly throughout the day.  Protect yourself by wearing long sleeves, pants, a wide-brimmed hat, and sunglasses whenever you are outside.  Heart disease, diabetes, and high blood pressure  High blood pressure causes heart disease and increases the risk of stroke. High blood pressure is more likely to develop in: ? People who have blood pressure in the high end of the normal range (130-139/85-89 mm Hg). ? People who are overweight or obese. ? People who are African American.  If you are 21-88 years of age, have your blood pressure checked every 3-5 years. If you are 42 years of age or older, have your blood pressure checked every year. You should have your blood pressure measured twice-once when you are at a hospital or clinic, and once when you are not at a hospital or clinic. Record the average of the two measurements. To check your blood pressure when you are not at a hospital or clinic, you can use: ? An automated blood pressure machine at a pharmacy. ? A home blood pressure monitor.  If you are between 70 years and 9 years old, ask your health care provider if you should take aspirin to prevent strokes.  Have regular diabetes screenings. This involves taking a blood sample to check your fasting blood sugar level. ? If you are at a normal weight and have a low risk for diabetes, have this test once every three years after 67 years of age. ? If you are overweight and have a high risk for diabetes, consider being tested at a younger age or more often. Preventing infection Hepatitis B  If you have a higher risk for hepatitis B, you should be screened for this virus. You are considered  at high risk for hepatitis B if: ? You were born in a country where hepatitis B is common. Ask your health care provider which countries are considered high risk. ? Your parents were born in a high-risk country, and you have not been immunized  against hepatitis B (hepatitis B vaccine). ? You have HIV or AIDS. ? You use needles to inject street drugs. ? You live with someone who has hepatitis B. ? You have had sex with someone who has hepatitis B. ? You get hemodialysis treatment. ? You take certain medicines for conditions, including cancer, organ transplantation, and autoimmune conditions.  Hepatitis C  Blood testing is recommended for: ? Everyone born from [birth year] through 1966. ? Anyone with known risk factors for hepatitis C.  Sexually transmitted infections (STIs)  You should be screened for sexually transmitted infections (STIs) including gonorrhea and chlamydia if: ? You are sexually active and are younger than 67 years of age. ? You are older than 67 years of age and your health care provider tells you that you are at risk for this type of infection. ? Your sexual activity has changed since you were last screened and you are at an increased risk for chlamydia or gonorrhea. Ask your health care provider if you are at risk.  If you do not have HIV, but are at risk, it may be recommended that you take a prescription medicine daily to prevent HIV infection. This is called pre-exposure prophylaxis (PrEP). You are considered at risk if: ? You are sexually active and do not regularly use condoms or know the HIV status of your partner(s). ? You take drugs by injection. ? You are sexually active with a partner who has HIV.  Talk with your health care provider about whether you are at high risk of being infected with HIV. If you choose to begin PrEP, you should first be tested for HIV. You should then be tested every 3 months for as long as you are taking PrEP. Pregnancy  If you are  premenopausal and you may become pregnant, ask your health care provider about preconception counseling.  If you may become pregnant, take 400 to 800 micrograms (mcg) of folic acid every day.  If you want to prevent pregnancy, talk to your health care provider about birth control (contraception). Osteoporosis and menopause  Osteoporosis is a disease in which the bones lose minerals and strength with aging. This can result in serious bone fractures. Your risk for osteoporosis can be identified using a bone density scan.  If you are 58 years of age or older, or if you are at risk for osteoporosis and fractures, ask your health care provider if you should be screened.  Ask your health care provider whether you should take a calcium or vitamin D supplement to lower your risk for osteoporosis.  Menopause may have certain physical symptoms and risks.  Hormone replacement therapy may reduce some of these symptoms and risks. Talk to your health care provider about whether hormone replacement therapy is right for you. Follow these instructions at home:  Schedule regular health, dental, and eye exams.  Stay current with your immunizations.  Do not use any tobacco products including cigarettes, chewing tobacco, or electronic cigarettes.  If you are pregnant, do not drink alcohol.  If you are breastfeeding, limit how much and how often you drink alcohol.  Limit alcohol intake to no more than 1 drink per day for nonpregnant women. One drink equals 12 ounces of beer, 5 ounces of wine, or 1 ounces of hard liquor.  Do not use street drugs.  Do not share needles.  Ask your health care provider for help if you need support or information about quitting drugs.  Tell your health care provider if you  often feel depressed.  Tell your health care provider if you have ever been abused or do not feel safe at home. This information is not intended to replace advice given to you by your health care  provider. Make sure you discuss any questions you have with your health care provider. Document Released: 07/16/2010 Document Revised: 06/08/2015 Document Reviewed: 10/04/2014 Elsevier Interactive Patient Education  2018 [employer].  High-Protein and High-Calorie Diet Eating high-protein and high-calorie foods can help you to gain weight, heal after an injury, and recover after an illness or surgery. What is my plan? The specific amount of daily protein and calories you need depends on:  Your body weight.  The reason this diet is recommended for you.  Generally, a high-protein, high-calorie diet involves:  Eating 250-500 extra calories each day.  Making sure that 10-35% of your daily calories come from protein.  Talk to your health care provider about how much protein and how many calories you need each day. Follow the diet as directed by your health care provider. What do I need to know about this diet?  Ask your health care provider if you should take a nutritional supplement.  Try to eat six small meals each day instead of three large meals.  Eat a balanced diet, including one food that is high in protein at each meal.  Keep nutritious snacks handy, such as nuts, trail mixes, dried fruit, and yogurt.  If you have kidney disease or diabetes, eating too much protein may put extra stress on your kidneys. Talk to your health care provider if you have either of those conditions. What are some high-protein foods? Grains Quinoa. Bulgur wheat. Vegetables Soybeans. Peas. Meats and Other Protein Sources Beef, pork, and poultry. Fish and seafood. Eggs. Tofu. Textured vegetable protein (TVP). Peanut butter. Nuts and seeds. Dried beans. Protein powders. Dairy Whole milk. Whole-milk yogurt. Powdered milk. Cheese. [employer]. Eggnog. Beverages High-protein supplement drinks. Soy milk. Other Protein bars. The items listed above may not be a complete list of recommended foods or  beverages. Contact your dietitian for more options. What are some high-calorie foods? Grains Pasta. Quick breads. Muffins. Pancakes. Ready-to-eat cereal. Vegetables Vegetables cooked in oil or butter. Fried potatoes. Fruits Dried fruit. Fruit leather. Canned fruit in syrup. Fruit juice. Avocados. Meats and Other Protein Sources Peanut butter. Nuts and seeds. Dairy Heavy cream. Whipped cream. Cream cheese. Sour cream. Ice cream. Custard. Pudding. Beverages Meal-replacement beverages. Nutrition shakes. Fruit juice. Sugar-sweetened soft drinks. Condiments Salad dressing. Mayonnaise. Alfredo sauce. Fruit preserves or jelly. Honey. Syrup. Sweets/Desserts Cake. Cookies. Pie. Pastries. Candy bars. Chocolate. Fats and Oils Butter or margarine. Oil. Gravy. Other Meal-replacement bars. The items listed above may not be a complete list of recommended foods or beverages. Contact your dietitian for more options. What are some tips for including high-protein and high-calorie foods in my diet?  Add whole milk, half-and-half, or heavy cream to cereal, pudding, soup, or hot cocoa.  Add whole milk to instant breakfast drinks.  Add peanut butter to oatmeal or smoothies.  Add powdered milk to baked goods, smoothies, or milkshakes.  Add powdered milk, cream, or butter to mashed potatoes.  Add cheese to cooked vegetables.  Make whole-milk yogurt parfaits. Top them with granola, fruit, or nuts.  Add cottage cheese to your fruit.  Add avocados, cheese, or both to sandwiches or salads.  Add meat, poultry, or seafood to rice, pasta, casseroles, salads, and soups.  Use mayonnaise when making egg salad, chicken salad, or  tuna salad.  Use peanut butter as a topping for pretzels, celery, or crackers.  Add beans to casseroles, dips, and spreads.  Add pureed beans to sauces and soups.  Replace calorie-free drinks with calorie-containing drinks, such as milk and fruit juice. This information is  not intended to replace advice given to you by your health care provider. Make sure you discuss any questions you have with your health care provider. Document Released: 12/31/2004 Document Revised: 06/08/2015 Document Reviewed: 06/15/2013 Elsevier Interactive Patient Education  2018 [employer].  Carbohydrate Counting for Diabetes Mellitus, Adult Carbohydrate counting is a method for keeping track of how many carbohydrates you eat. Eating carbohydrates naturally increases the amount of sugar (glucose) in the blood. Counting how many carbohydrates you eat helps keep your blood glucose within normal limits, which helps you manage your diabetes (diabetes mellitus). It is important to know how many carbohydrates you can safely have in each meal. This is different for every person. A diet and nutrition specialist (registered dietitian) can help you make a meal plan and calculate how many carbohydrates you should have at each meal and snack. Carbohydrates are found in the following foods:  Grains, such as breads and cereals.  Dried beans and soy products.  Starchy vegetables, such as potatoes, peas, and corn.  Fruit and fruit juices.  Milk and yogurt.  Sweets and snack foods, such as cake, cookies, candy, chips, and soft drinks.  How do I count carbohydrates? There are two ways to count carbohydrates in food. You can use either of the methods or a combination of both. Reading "Nutrition Facts" on packaged food The "Nutrition Facts" list is included on the labels of almost all packaged foods and beverages in the [place] It includes:  The serving size.  Information about nutrients in each serving, including the grams (g) of carbohydrate per serving.  To use the "Nutrition Facts":  Decide how many servings you will have.  Multiply the number of servings by the number of carbohydrates per serving.  The resulting number is the total amount of carbohydrates that you will be  having.  Learning standard serving sizes of other foods When you eat foods containing carbohydrates that are not packaged or do not include "Nutrition Facts" on the label, you need to measure the servings in order to count the amount of carbohydrates:  Measure the foods that you will eat with a food scale or measuring cup, if needed.  Decide how many standard-size servings you will eat.  Multiply the number of servings by 15. Most carbohydrate-rich foods have about 15 g of carbohydrates per serving. ? For example, if you eat 8 oz (170 g) of strawberries, you will have eaten 2 servings and 30 g of carbohydrates (2 servings x 15 g = 30 g).  For foods that have more than one food mixed, such as soups and casseroles, you must count the carbohydrates in each food that is included.  The following list contains standard serving sizes of common carbohydrate-rich foods. Each of these servings has about 15 g of carbohydrates:   hamburger bun or  English muffin.   oz (15 mL) syrup.   oz (14 g) jelly.  1 slice of bread.  1 six-inch tortilla.  3 oz (85 g) cooked rice or pasta.  4 oz (113 g) cooked dried beans.  4 oz (113 g) starchy vegetable, such as peas, corn, or potatoes.  4 oz (113 g) hot cereal.  4 oz (113 g) mashed potatoes or  of a large baked potato.  4 oz (113 g) canned or frozen fruit.  4 oz (120 mL) fruit juice.  4-6 crackers.  6 chicken nuggets.  6 oz (170 g) unsweetened dry cereal.  6 oz (170 g) plain fat-free yogurt or yogurt sweetened with artificial sweeteners.  8 oz (240 mL) milk.  8 oz (170 g) fresh fruit or one small piece of fruit.  24 oz (680 g) popped popcorn.  Example of carbohydrate counting Sample meal  3 oz (85 g) chicken breast.  6 oz (170 g) brown rice.  4 oz (113 g) corn.  8 oz (240 mL) milk.  8 oz (170 g) strawberries with sugar-free whipped topping. Carbohydrate calculation 1. Identify the foods that contain  carbohydrates: ? Rice. ? Corn. ? Milk. ? Strawberries. 2. Calculate how many servings you have of each food: ? 2 servings rice. ? 1 serving corn. ? 1 serving milk. ? 1 serving strawberries. 3. Multiply each number of servings by 15 g: ? 2 servings rice x 15 g = 30 g. ? 1 serving corn x 15 g = 15 g. ? 1 serving milk x 15 g = 15 g. ? 1 serving strawberries x 15 g = 15 g. 4. Add together all of the amounts to find the total grams of carbohydrates eaten: ? 30 g + 15 g + 15 g + 15 g = 75 g of carbohydrates total. This information is not intended to replace advice given to you by your health care provider. Make sure you discuss any questions you have with your health care provider. Document Released: 12/31/2004 Document Revised: 07/21/2015 Document Reviewed: 06/14/2015 Elsevier Interactive Patient Education  2018 [employer]. Diabetes Mellitus and Food It is important for you to manage your blood sugar (glucose) level. Your blood glucose level can be greatly affected by what you eat. Eating healthier foods in the appropriate amounts throughout the day at about the same time each day will help you control your blood glucose level. It can also help slow or prevent worsening of your diabetes mellitus. Healthy eating may even help you improve the level of your blood pressure and reach or maintain a healthy weight. General recommendations for healthful eating and cooking habits include:  Eating meals and snacks regularly. Avoid going long periods of time without eating to lose weight.  Eating a diet that consists mainly of plant-based foods, such as fruits, vegetables, nuts, legumes, and whole grains.  Using low-heat cooking methods, such as baking, instead of high-heat cooking methods, such as deep frying.  Work with your dietitian to make sure you understand how to use the Nutrition Facts information on food labels. How can food affect me? Carbohydrates Carbohydrates affect your blood  glucose level more than any other type of food. Your dietitian will help you determine how many carbohydrates to eat at each meal and teach you how to count carbohydrates. Counting carbohydrates is important to keep your blood glucose at a healthy level, especially if you are using insulin or taking certain medicines for diabetes mellitus. Alcohol Alcohol can cause sudden decreases in blood glucose (hypoglycemia), especially if you use insulin or take certain medicines for diabetes mellitus. Hypoglycemia can be a life-threatening condition. Symptoms of hypoglycemia (sleepiness, dizziness, and disorientation) are similar to symptoms of having too much alcohol. If your health care provider has given you approval to drink alcohol, do so in moderation and use the following guidelines:  Women should not have more than one drink per  day, and men should not have more than two drinks per day. One drink is equal to: ? 12 oz of beer. ? 5 oz of wine. ? 1 oz of hard liquor.  Do not drink on an empty stomach.  Keep yourself hydrated. Have water, diet soda, or unsweetened iced tea.  Regular soda, juice, and other mixers might contain a lot of carbohydrates and should be counted.  What foods are not recommended? As you make food choices, it is important to remember that all foods are not the same. Some foods have fewer nutrients per serving than other foods, even though they might have the same number of calories or carbohydrates. It is difficult to get your body what it needs when you eat foods with fewer nutrients. Examples of foods that you should avoid that are high in calories and carbohydrates but low in nutrients include:  Trans fats (most processed foods list trans fats on the Nutrition Facts label).  Regular soda.  Juice.  Candy.  Sweets, such as cake, pie, doughnuts, and cookies.  Fried foods.  What foods can I eat? Eat nutrient-rich foods, which will nourish your body and keep you  healthy. The food you should eat also will depend on several factors, including:  The calories you need.  The medicines you take.  Your weight.  Your blood glucose level.  Your blood pressure level.  Your cholesterol level.  You should eat a variety of foods, including:  Protein. ? Lean cuts of meat. ? Proteins low in saturated fats, such as fish, egg whites, and beans. Avoid processed meats.  Fruits and vegetables. ? Fruits and vegetables that may help control blood glucose levels, such as apples, mangoes, and yams.  Dairy products. ? Choose fat-free or low-fat dairy products, such as milk, yogurt, and cheese.  Grains, bread, pasta, and rice. ? Choose whole grain products, such as multigrain bread, whole oats, and brown rice. These foods may help control blood pressure.  Fats. ? Foods containing healthful fats, such as nuts, avocado, olive oil, canola oil, and fish.  Does everyone with diabetes mellitus have the same meal plan? Because every person with diabetes mellitus is different, there is not one meal plan that works for everyone. It is very important that you meet with a dietitian who will help you create a meal plan that is just right for you. This information is not intended to replace advice given to you by your health care provider. Make sure you discuss any questions you have with your health care provider. Document Released: 09/27/2004 Document Revised: 06/08/2015 Document Reviewed: 11/27/2012 Elsevier Interactive Patient Education  2017 [employer].

## 2016-10-11 NOTE — Assessment & Plan Note (Signed)
Checking lipid panel and adjust vytorin as needed. Goal LDL <100.

## 2016-10-11 NOTE — Assessment & Plan Note (Signed)
Foot exam done, checking HgA1c and microalbumin. Controlled by diet. On statin.

## 2016-11-03 DIAGNOSIS — H43813 Vitreous degeneration, bilateral: Secondary | ICD-10-CM | POA: Insufficient documentation

## 2016-12-02 ENCOUNTER — Other Ambulatory Visit: Payer: Self-pay | Admitting: Internal Medicine

## 2016-12-16 ENCOUNTER — Encounter: Payer: Self-pay | Admitting: Internal Medicine

## 2016-12-16 ENCOUNTER — Ambulatory Visit: Payer: Medicare Other | Admitting: Internal Medicine

## 2016-12-16 ENCOUNTER — Other Ambulatory Visit (INDEPENDENT_AMBULATORY_CARE_PROVIDER_SITE_OTHER): Payer: Medicare Other

## 2016-12-16 VITALS — BP 122/82 | HR 67 | Temp 98.5°F | Ht 64.5 in | Wt 179.0 lb

## 2016-12-16 DIAGNOSIS — R221 Localized swelling, mass and lump, neck: Secondary | ICD-10-CM

## 2016-12-16 DIAGNOSIS — R42 Dizziness and giddiness: Secondary | ICD-10-CM

## 2016-12-16 DIAGNOSIS — M542 Cervicalgia: Secondary | ICD-10-CM | POA: Diagnosis not present

## 2016-12-16 DIAGNOSIS — R51 Headache: Secondary | ICD-10-CM

## 2016-12-16 DIAGNOSIS — R519 Headache, unspecified: Secondary | ICD-10-CM | POA: Insufficient documentation

## 2016-12-16 DIAGNOSIS — G4452 New daily persistent headache (NDPH): Secondary | ICD-10-CM | POA: Diagnosis not present

## 2016-12-16 LAB — COMPREHENSIVE METABOLIC PANEL
ALBUMIN: 4.3 g/dL (ref 3.5–5.2)
ALT: 19 U/L (ref 0–35)
AST: 18 U/L (ref 0–37)
Alkaline Phosphatase: 90 U/L (ref 39–117)
BILIRUBIN TOTAL: 0.4 mg/dL (ref 0.2–1.2)
BUN: 10 mg/dL (ref 6–23)
CALCIUM: 9.9 mg/dL (ref 8.4–10.5)
CHLORIDE: 102 meq/L (ref 96–112)
CO2: 28 meq/L (ref 19–32)
CREATININE: 0.66 mg/dL (ref 0.40–1.20)
GFR: 114.66 mL/min (ref >60.00)
Glucose, Bld: 146 mg/dL — ABNORMAL HIGH (ref 70–99)
Potassium: 3.5 mEq/L (ref 3.5–5.1)
Sodium: 142 mEq/L (ref 135–145)
Total Protein: 7.5 g/dL (ref 6.0–8.3)

## 2016-12-16 LAB — CBC
HCT: 42.8 % (ref 36.0–46.0)
HEMOGLOBIN: 13.7 g/dL (ref 12.0–15.0)
MCHC: 32 g/dL (ref 30.0–36.0)
MCV: 86.2 fl (ref 78.0–100.0)
PLATELETS: 312 10*3/uL (ref 150.0–400.0)
RBC: 4.97 Mil/uL (ref 3.87–5.11)
RDW: 14.3 % (ref 11.5–15.5)
WBC: 6.4 10*3/uL (ref 4.0–10.5)

## 2016-12-16 LAB — VITAMIN B12: VITAMIN B 12: 333 pg/mL (ref 211–911)

## 2016-12-16 LAB — VITAMIN D 25 HYDROXY (VIT D DEFICIENCY, FRACTURES): VITD: 35.79 ng/mL (ref 30.00–100.00)

## 2016-12-16 MED ORDER — MELOXICAM 15 MG PO TABS: 15.0000 mg | ORAL_TABLET | Freq: Every day | ORAL | 0 refills | Status: DC

## 2016-12-16 NOTE — Assessment & Plan Note (Signed)
Suspect related to the muscular problems in her neck.  Meloxicam Rx for neck pain.  I do not feel she needs further imaging at this time.  Talked to her about stretching exercises for her neck and shoulders.

## 2016-12-16 NOTE — Progress Notes (Signed)
Subjective:    Patient ID: Anna Cooper, female    DOB: 1950/02/22, 66 y.o.   MRN: [id number]  HPI The patient is a 66 YO female coming in for several concerns including dizziness (feeling lightheaded off and on, started about 1 month ago, overall increasing and now most days, lasts a short time but comes back, no pattern, can happen at rest, denies chest pains, SOB, vomiting, diarrhea or constipation, some nausea but still eating and drinking normally), and neck pain (pain in the muscles, has taken ibuprofen and Tylenol without relief, overall stable since onset, going on about 2 months, has numbness or weakness in her arms, no radiation down her arms, pain 6 out of 10), headaches (start at the base of her skull, gradual onset, pain 5 out of 10 at maximum, no photophobia or phonophobia, aura, has taken OTC medications with good relief, feels like she has muscle spasms in her neck around the time of her headaches, no recent falls or head trauma).    Review of Systems  Constitutional: Positive for activity change, appetite change and fatigue. Negative for chills, fever and unexpected weight change.  HENT: Negative.   Eyes: Negative.  Negative for photophobia, discharge, redness, itching and visual disturbance.  Respiratory: Negative for cough, chest tightness and shortness of breath.   Cardiovascular: Negative for chest pain, palpitations and leg swelling.  Gastrointestinal: Negative for abdominal distention, abdominal pain, constipation, diarrhea, nausea and vomiting.  Musculoskeletal: Positive for arthralgias, back pain, myalgias and neck pain. Negative for neck stiffness.  Skin: Negative.   Neurological: Positive for dizziness, weakness, light-headedness and headaches. Negative for tremors, seizures, facial asymmetry and speech difficulty.  Psychiatric/Behavioral: Negative.       Objective:   Physical Exam  Constitutional: She is oriented to person, place, and time. She appears  well-developed and well-nourished.  HENT:  Head: Normocephalic and atraumatic.  TMs normal bilaterally  Eyes: EOM are normal.  Neck: Normal range of motion.  Cardiovascular: Normal rate and regular rhythm.  Pulmonary/Chest: Effort normal and breath sounds normal. No respiratory distress. She has no wheezes. She has no rales.  Abdominal: Soft. Bowel sounds are normal. She exhibits no distension. There is no tenderness. There is no rebound.  Musculoskeletal: She exhibits tenderness. She exhibits no edema.  Pain in the neck muscles, no vertigo with head movements  Neurological: She is alert and oriented to person, place, and time. Coordination normal.  No orthostatic symptoms in the office  Skin: Skin is warm and dry.  Psychiatric: She has a normal mood and affect.   Vitals:   12/16/16 0831 12/16/16 0930  BP: 126/78 122/82  Pulse: 67   Temp: 98.5 F (36.9 C)   TempSrc: Oral   SpO2: 98%   Weight: 179 lb (81.2 kg)   Height: 5' 4.5" (1.638 m)       Assessment & Plan:

## 2016-12-16 NOTE — Patient Instructions (Addendum)
We will check some labs today and try cutting the amlodipine in half to see if this helps.   Consider making a diary of when the symptoms bother you so we can see if there is a pattern.  For the neck and shoulder pain we can try an anti-inflammatory medicine called meloxicam to take daily for the pain.  Work on starting some exercise to help keep your muscles strong and drink plenty of fluids.

## 2016-12-16 NOTE — Assessment & Plan Note (Signed)
She is a poor historian, does not sound to be vertigo related.  Suspect some orthostatic symptoms so we will decrease amlodipine to 10 mg daily, continue HCTZ 12.5 mg daily.  CBC, vitamin D, CMP, B12.  Thyroid normal recently.  Referral to ENT (more for sensation of something in her throat).

## 2016-12-16 NOTE — Assessment & Plan Note (Signed)
Rx for meloxicam, talked to her about stretching exercises.

## 2016-12-24 ENCOUNTER — Other Ambulatory Visit: Payer: Self-pay | Admitting: Internal Medicine

## 2016-12-24 DIAGNOSIS — Z1231 Encounter for screening mammogram for malignant neoplasm of breast: Secondary | ICD-10-CM

## 2017-01-03 DIAGNOSIS — E079 Disorder of thyroid, unspecified: Secondary | ICD-10-CM | POA: Insufficient documentation

## 2017-01-09 ENCOUNTER — Other Ambulatory Visit: Payer: Self-pay | Admitting: Otolaryngology

## 2017-01-09 DIAGNOSIS — E079 Disorder of thyroid, unspecified: Secondary | ICD-10-CM

## 2017-01-13 ENCOUNTER — Ambulatory Visit
Admission: RE | Admit: 2017-01-13 | Discharge: 2017-01-13 | Disposition: A | Discharge status: Home / Self Care | Payer: Medicare Other | Source: Ambulatory Visit | Attending: Otolaryngology | Admitting: Otolaryngology

## 2017-01-13 DIAGNOSIS — E079 Disorder of thyroid, unspecified: Secondary | ICD-10-CM

## 2017-01-14 DIAGNOSIS — Z9889 Other specified postprocedural states: Secondary | ICD-10-CM

## 2017-01-14 HISTORY — DX: Other specified postprocedural states: Z98.890

## 2017-01-23 ENCOUNTER — Telehealth: Payer: Self-pay | Admitting: Internal Medicine

## 2017-01-23 MED ORDER — MELOXICAM 15 MG PO TABS: 15.0000 mg | ORAL_TABLET | Freq: Every day | ORAL | 0 refills | Status: DC

## 2017-01-23 NOTE — Telephone Encounter (Signed)
Reviewed chart pt is up-to-date sent refills to [facility]../lmb  

## 2017-01-23 NOTE — Telephone Encounter (Signed)
Pt needs a refill of her meloxicam (MOBIC) 15 MG tablet Please send to the [facility]  On File  Please advise

## 2017-01-23 NOTE — Addendum Note (Signed)
Addended by: Deatra JamesBRAND, Sole Lengacher M on: 01/23/2017 03:59 PM   Modules accepted: Orders

## 2017-01-24 ENCOUNTER — Ambulatory Visit: Payer: Medicare Other | Admitting: Internal Medicine

## 2017-01-24 ENCOUNTER — Encounter: Payer: Self-pay | Admitting: Internal Medicine

## 2017-01-24 DIAGNOSIS — R42 Dizziness and giddiness: Secondary | ICD-10-CM

## 2017-01-24 DIAGNOSIS — I10 Essential (primary) hypertension: Secondary | ICD-10-CM | POA: Diagnosis not present

## 2017-01-24 MED ORDER — AMLODIPINE BESYLATE 5 MG PO TABS: 5.0000 mg | ORAL_TABLET | Freq: Every day | ORAL | 3 refills | Status: DC

## 2017-01-24 NOTE — Patient Instructions (Signed)
We have sent in the amlodipine 5 mg pills to take 1 pill daily.

## 2017-01-24 NOTE — Assessment & Plan Note (Signed)
Resolved with decreasing BP medications.

## 2017-01-24 NOTE — Progress Notes (Signed)
Subjective:    Patient ID: Anna Cooper, female    DOB: 01/14/1951, 67 y.o.   MRN: [id number]  HPI The patient is a 67 YO female coming in for BP check. Last visit she was having dizziness and we decreased amlodipine to 5 mg daily. She has done that and now is not dizzy any longer. She denies new side effects. BP is doing well at home. Denies chest pains or headaches.   Review of Systems  Constitutional: Negative.   HENT: Negative.   Eyes: Negative.   Respiratory: Negative for cough, chest tightness and shortness of breath.   Cardiovascular: Negative for chest pain, palpitations and leg swelling.  Gastrointestinal: Negative for abdominal distention, abdominal pain, constipation, diarrhea, nausea and vomiting.  Musculoskeletal: Negative.   Skin: Negative.   Neurological: Negative.       Objective:   Physical Exam  Constitutional: She is oriented to person, place, and time. She appears well-developed and well-nourished.  HENT:  Head: Normocephalic and atraumatic.  Eyes: EOM are normal.  Neck: Normal range of motion.  Cardiovascular: Normal rate and regular rhythm.  Pulmonary/Chest: Effort normal and breath sounds normal. No respiratory distress. She has no wheezes. She has no rales.  Abdominal: Soft. Bowel sounds are normal. She exhibits no distension. There is no tenderness. There is no rebound.  Musculoskeletal: She exhibits no edema.  Neurological: She is alert and oriented to person, place, and time. Coordination normal.  Skin: Skin is warm and dry.  Psychiatric: She has a normal mood and affect.   Vitals:   01/24/17 1458  BP: 124/78  Pulse: 75  Temp: 98.6 F (37 C)  TempSrc: Oral  SpO2: 98%  Weight: 180 lb (81.6 kg)  Height: 5' 4.5" (1.638 m)      Assessment & Plan:

## 2017-01-24 NOTE — Assessment & Plan Note (Signed)
BP still at goal on amlodipine 5 mg daily and hctz 12.5 mg.

## 2017-02-10 ENCOUNTER — Ambulatory Visit
Admission: RE | Admit: 2017-02-10 | Discharge: 2017-02-10 | Disposition: A | Discharge status: Home / Self Care | Payer: Medicare Other | Source: Ambulatory Visit | Attending: Internal Medicine | Admitting: Internal Medicine

## 2017-02-10 DIAGNOSIS — Z1231 Encounter for screening mammogram for malignant neoplasm of breast: Secondary | ICD-10-CM

## 2017-03-13 ENCOUNTER — Other Ambulatory Visit: Payer: Self-pay | Admitting: Internal Medicine

## 2017-07-01 ENCOUNTER — Telehealth: Payer: Self-pay | Admitting: Internal Medicine

## 2017-07-01 ENCOUNTER — Ambulatory Visit (INDEPENDENT_AMBULATORY_CARE_PROVIDER_SITE_OTHER): Payer: Medicare Other | Admitting: *Deleted

## 2017-07-01 VITALS — BP 130/68 | HR 62 | Resp 18 | Ht 65.0 in | Wt 175.0 lb

## 2017-07-01 DIAGNOSIS — Z0000 Encounter for general adult medical examination without abnormal findings: Secondary | ICD-10-CM

## 2017-07-01 NOTE — Progress Notes (Signed)
Subjective:   Anna Cooper is a 67 y.o. female who presents for Medicare Annual (Subsequent) preventive examination.  Review of Systems:  No ROS.  Medicare Wellness Visit. Additional risk factors are reflected in the social history.  Cardiac Risk Factors include: advanced age (>57mn, >>58women);diabetes mellitus;hypertension Sleep patterns: gets up 1 times nightly to void and sleeps 5-6 hours nightly. Patient reports insomnia issues, discussed recommended sleep tips and stress reduction tips. Relevant patient education assigned to patient using Emmi.   Safety/Smoke Alarms: Feels safe in home. Smoke alarms in place.  Living environment; residence and Firearm Safety: 1-story house/ trailer, no firearms Lives alone, no needs for DME, good support system. Seat Belt Safety/Bike Helmet: Wears seat belt.    Objective:     Vitals: BP 130/68   Pulse 62   Resp 18   Ht 5' 5" (1.651 m)   Wt 175 lb (79.4 kg)   SpO2 97%   BMI 29.12 kg/m   Body mass index is 29.12 kg/m.  Advanced Directives 07/01/2017 10/11/2016 07/01/2016 06/24/2016 08/29/2015 11/15/2014  Does Patient Have a Medical Advance Directive? _0  Yes  Type of AParamedicof [place] will [place] will [place] will [place] will - -  Does patient want to make changes to medical advance directive? - - - No - Patient declined - -  Copy of [place] Chart? No - copy requested No - copy requested - No - copy requested No - copy requested No - copy requested    Tobacco Social History   Tobacco Use  Smoking Status Never Smoker  Smokeless Tobacco Never Used     Counseling given: Not Answered  Past Medical History:  Diagnosis Date  . Fuchs' corneal dystrophy    s/p  [facility]-- right 09-24-2011 and left 02-11-2012  . History of recurrent UTIs   .  History of venereal warts   . Hyperlipidemia   . Hypertension   . Infantile paralysis   . Major depression   . PMB (postmenopausal bleeding)   . Type 2 diabetes mellitus ([place] 03/31/2016   not on meds currently   Past Surgical History:  Procedure Laterality Date  . BREAST BIOPSY Right   . BUNIONECTOMY WITH HAMMERTOE RECONSTRUCTION Bilateral right 07/ 2013;  left 11/ 2013  . 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 COLONOSCOPY    . DILATATION & CURETTAGE/HYSTEROSCOPY WITH MYOSURE N/A 06/24/2016   Procedure: DILATATION & CURETTAGE/HYSTEROSCOPY WITH MYOSURE;  Surgeon: LTyson Dense MD;  [facility]  Service: Gynecology;  Laterality: N/A;  . TUBAL LIGATION  1980's   Family History  Problem Relation Age of Onset  . Diabetes Mother   . Breast cancer Mother 650      dx late 637's was in remission but had relapse with mets. Died at 818 . Heart attack Father   . Seizures Father   . Heart disease Father        CAD/MI-fatal  . Breast cancer Sister 69      dx early 627's BRCA2+, now is 717 . Breast cancer Sister 417      dx. in 428's BRCA2-,   . Cancer Maternal Grandfather  Lung Cancer  . Breast cancer Maternal Grandfather   . Prostate cancer Maternal Uncle 69       dx and died in his 24's  . Colon cancer Maternal Aunt 54       dx in 56's, had 62 in of colon removed, died in her 59's  . Alzheimer's disease Paternal Aunt   . Breast cancer Other 35       BRCA2 status unk (did not share result with famiy)  . Kidney cancer Other 35       dx in 02/25/2022, is no in his 39's  . Breast cancer Cousin 19  . Pancreatic cancer Cousin 14       died in 31's  . Lung cancer Maternal Uncle 2068-02-26       dx 60's/70's, now is 51  . Stomach cancer Cousin 40       died at 77  . Cancer Cousin 25       dx and died in 25-Feb-2022. type  of cancer unk  . Throat cancer Cousin 38       died in 68's  . Cancer Cousin        dx age unk, died in 88's/60's. type of CA unk  . Cancer Cousin        dx age unk, died in 59's. type of CA unk  . Cancer Cousin        age dx unk, died in 7's   Social History   Socioeconomic History  . Marital status: Legally Separated    Spouse name: Not on file  . Number of children: 2  . Years of education: 37  . Highest education level: Not on file  Occupational History  . Occupation: Tourist information centre manager  Social Needs  . Financial resource strain: Not hard at all  . Food insecurity:    Worry: Never true    Inability: Never true  . Transportation needs:    Medical: No    Non-medical: No  Tobacco Use  . Smoking status: Never Smoker  . Smokeless tobacco: Never Used  Substance and Sexual Activity  . Alcohol use: No  . Drug use: No  . Sexual activity: Not Currently  Lifestyle  . Physical activity:    Days per week: 4 days    Minutes per session: 40 min  . Stress: Only a little  Relationships  . Social connections:    Talks on phone: More than three times a week    Gets together: More than three times a week    Attends religious service: More than 4 times per year    Active member of club or organization: Yes    Attends meetings of clubs or organizations: More than 4 times per year    Relationship status: Separated  Other Topics Concern  . Not on file  Social History Narrative   HS[employer], College Grad, [employer]; [employer]- education. Married '71-seperated-'06.1 son 2069-02-25, 1 daughter-'78; 2 grandchildren. 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 Daughter had a baby Jun 11, 2008, _0  Aug 12th,'11. Lives in [place]. Not sexually active    Outpatient Encounter Medications as of 07/01/2017  Medication Sig  . amLODipine (NORVASC) 5 MG tablet Take 1 tablet (5 mg total) by mouth daily.  . Cholecalciferol (VITAMIN D3) 1000 UNITS CAPS Take by mouth.  Take one tablets by mouth once daily.  . fluorometholone (FML) 0.1 % ophthalmic suspension Apply to eye.  . Glucosamine-Chondroit-Vit  C-Mn (GLUCOSAMINE 1500 COMPLEX PO) Take 1,500 mg by mouth 2 (two) times daily.  . hydrochlorothiazide (MICROZIDE) 12.5 MG capsule TAKE 1 CAPSULE BY MOUTH  DAILY  . meloxicam (MOBIC) 15 MG tablet Take 1 tablet (15 mg total) by mouth daily. (Patient taking differently: Take 15 mg by mouth as needed. )  . Multiple Vitamins-Minerals (CENTRUM SILVER ADULT 50+ PO) Take by mouth.  . potassium chloride (K-DUR,KLOR-CON) 10 MEQ tablet TAKE 1 TABLET BY MOUTH  DAILY  . VYTORIN 10-20 MG tablet TAKE 1 TABLET BY MOUTH  DAILY  . [DISCONTINUED] potassium chloride (K-DUR) 10 MEQ tablet Take 1 tablet (10 mEq total) by mouth daily.  . [DISCONTINUED] prednisoLONE acetate (PRED FORTE) 1 % ophthalmic suspension Place 1 drop into the right eye 2 (two) times daily. (Patient not taking: Reported on 07/01/2017)   No facility-administered encounter medications on file as of 07/01/2017.     Activities of Daily Living In your present state of health, do you have any difficulty performing the following activities: 07/01/2017 10/11/2016  Hearing? N N  Vision? N N  Difficulty concentrating or making decisions? N N  Walking or climbing stairs? N N  Dressing or bathing? N N  Doing errands, shopping? N N  Preparing Food and eating ? N N  Using the Toilet? N N  In the past six months, have you accidently leaked urine? N N  Do you have problems with loss of bowel control? N N  Managing your Medications? N N  Managing your Finances? N N  Housekeeping or managing your Housekeeping? N N  Some recent data might be hidden    Patient Care Team: Hoyt Koch, MD as PCP - General (Internal Medicine) Newt Minion, MD as Consulting Physician (Orthopedic Surgery) Marylynn Pearson, MD as Consulting Physician (Ophthalmology)    Assessment:   This is a routine wellness examination for Jaisha.  Physical assessment deferred to PCP.   Exercise Activities and Dietary recommendations Current Exercise Habits: Home exercise routine, Type of exercise: walking, Time (Minutes): 30, Frequency (Times/Week): 4, Weekly Exercise (Minutes/Week): 120, Intensity: Mild, Exercise limited by: None identified  Diet (meal preparation, eat out, water intake, caffeinated beverages, dairy products, fruits and vegetables): in general, a "healthy" diet  , well balanced   Reviewed heart healthy and diabetic diet. Encouraged patient to increase daily water and fluid intake. Relevant patient education assigned to patient using Emmi and via hand-outs.    Goals    . Patient Stated     Improve my diabetes by monitoring carbohydrates and sugars, continue to exercise, and use portion control. Enjoy life, family and stay active in church.       Fall Risk Fall Risk  07/01/2017 10/11/2016 08/29/2015 12/01/2014 11/23/2014  Falls in the past year? _0    Depression Screen PHQ 2/9 Scores 07/01/2017 10/11/2016 10/11/2016 08/29/2015  PHQ - 2 Score 0 1 1 0  PHQ- 9 Score - 3 - -     Cognitive Function MMSE - Mini Mental State Exam 08/29/2015  Orientation to time 5  Orientation to Place 5  Registration 3  Attention/ Calculation 5  Recall 3  Language- name 2 objects 2  Language- repeat 1  Language- follow 3 step command 3  Language- read & follow direction 1  Write a sentence 1  Copy design 1  Total score 30       Ad8 score reviewed for issues:  Issues making decisions: no  Less interest in hobbies /  activities: no  Repeats questions, stories (family complaining): no  Trouble using ordinary gadgets (microwave, computer, phone):no  Forgets the month or year: no  Mismanaging finances: no  Remembering appts: no  Daily problems with thinking and/or memory: no Ad8 score is= 0  Immunization History  Administered Date(s) Administered  . Influenza Split 11/06/2011  . Influenza, High Dose  Seasonal PF 09/28/2015, 10/11/2016  . Influenza,inj,Quad PF,6+ Mos 10/28/2012, 11/16/2013  . Influenza-Unspecified 10/15/2014  . Pneumococcal Conjugate-13 01/25/2013  . Pneumococcal Polysaccharide-23 11/06/2011  . Tdap 11/11/2011  . Zoster 11/11/2011    Screening Tests Health Maintenance  Topic Date Due  . PNA vac Low Risk Adult (2 of 2 - PPSV23) 11/05/2016  . HEMOGLOBIN A1C  04/10/2017  . INFLUENZA VACCINE  08/14/2017  . FOOT EXAM  10/11/2017  . URINE MICROALBUMIN  10/11/2017  . MAMMOGRAM  02/10/2018  . OPHTHALMOLOGY EXAM  04/30/2018  . TETANUS/TDAP  11/10/2021  . COLONOSCOPY  07/16/2026  . DEXA SCAN  Completed  . Hepatitis C Screening  Completed      Plan:    Patient decline pneumonia vaccine stating that she will defer until next PCP visit.   Continue doing brain stimulating activities (puzzles, reading, adult coloring books, staying active) to keep memory sharp.   Continue to eat heart healthy diet (full of fruits, vegetables, whole grains, lean protein, water--limit salt, fat, and sugar intake) and increase physical activity as tolerated.  I have personally reviewed and noted the following in the patient's chart:   . Medical and social history . Use of alcohol, tobacco or illicit drugs  . Current medications and supplements . Functional ability and status . Nutritional status . Physical activity . Advanced directives . List of other physicians . Vitals . Screenings to include cognitive, depression, and falls . Referrals and appointments  In addition, I have reviewed and discussed with patient certain preventive protocols, quality metrics, and best practice recommendations. A written personalized care plan for preventive services as well as general preventive health recommendations were provided to patient.     Jill A Wine, RN  07/01/2017     

## 2017-07-01 NOTE — Patient Instructions (Addendum)
Continue doing brain stimulating activities (puzzles, reading, adult coloring books, staying active) to keep memory sharp.   Continue to eat heart healthy diet (full of fruits, vegetables, whole grains, lean protein, water--limit salt, fat, and sugar intake) and increase physical activity as tolerated.   Ms. Anna Cooper , Thank you for taking time to come for your Medicare Wellness Visit. I appreciate your ongoing commitment to your health goals. Please review the following plan we discussed and let me know if I can assist you in the future.   These are the goals we discussed: Goals    . Patient Stated     Improve my diabetes by monitoring carbohydrates and sugars, continue to exercise, and use portion control. Enjoy life, family and stay active in church.       This is a list of the screening recommended for you and due dates:  Health Maintenance  Topic Date Due  . Eye exam for diabetics  12/29/2015  . Pneumonia vaccines (2 of 2 - PPSV23) 11/05/2016  . Hemoglobin A1C  04/10/2017  . Flu Shot  08/14/2017  . Complete foot exam   10/11/2017  . Urine Protein Check  10/11/2017  . Mammogram  02/10/2018  . Tetanus Vaccine  11/10/2021  . Colon Cancer Screening  07/16/2026  . DEXA scan (bone density measurement)  Completed  .  Hepatitis C: One time screening is recommended by Center for Disease Control  (CDC) for  adults born from [birth year] through 1966.   Completed   Health Maintenance, Female Adopting a healthy lifestyle and getting preventive care can go a long way to promote health and wellness. Talk with your health care provider about what schedule of regular examinations is right for you. This is a good chance for you to check in with your provider about disease prevention and staying healthy. In between checkups, there are plenty of things you can do on your own. Experts have done a lot of research about which lifestyle changes and preventive measures are most likely to keep you healthy. Ask  your health care provider for more information. Weight and diet Eat a healthy diet  Be sure to include plenty of vegetables, fruits, low-fat dairy products, and lean protein.  Do not eat a lot of foods high in solid fats, added sugars, or salt.  Get regular exercise. This is one of the most important things you can do for your health. ? Most adults should exercise for at least 150 minutes each week. The exercise should increase your heart rate and make you sweat (moderate-intensity exercise). ? Most adults should also do strengthening exercises at least twice a week. This is in addition to the moderate-intensity exercise.  Maintain a healthy weight  Body mass index (BMI) is a measurement that can be used to identify possible weight problems. It estimates body fat based on height and weight. Your health care provider can help determine your BMI and help you achieve or maintain a healthy weight.  For females 23 years of age and older: ? A BMI below 18.5 is considered underweight. ? A BMI of 18.5 to 24.9 is normal. ? A BMI of 25 to 29.9 is considered overweight. ? A BMI of 30 and above is considered obese.  Watch levels of cholesterol and blood lipids  You should start having your blood tested for lipids and cholesterol at 68 years of age, then have this test every 5 years.  You may need to have your cholesterol levels checked more often  if: ? Your lipid or cholesterol levels are high. ? You are older than 68 years of age. ? You are at high risk for heart disease.  Cancer screening Lung Cancer  Lung cancer screening is recommended for adults 56-1 years old who are at high risk for lung cancer because of a history of smoking.  A yearly low-dose CT scan of the lungs is recommended for people who: ? Currently smoke. ? Have quit within the past 15 years. ? Have at least a 30-pack-year history of smoking. A pack year is smoking an average of one pack of cigarettes a day for 1  year.  Yearly screening should continue until it has been 15 years since you quit.  Yearly screening should stop if you develop a health problem that would prevent you from having lung cancer treatment.  Breast Cancer  Practice breast self-awareness. This means understanding how your breasts normally appear and feel.  It also means doing regular breast self-exams. Let your health care provider know about any changes, no matter how small.  If you are in your 20s or 30s, you should have a clinical breast exam (CBE) by a health care provider every 1-3 years as part of a regular health exam.  If you are 80 or older, have a CBE every year. Also consider having a breast X-ray (mammogram) every year.  If you have a family history of breast cancer, talk to your health care provider about genetic screening.  If you are at high risk for breast cancer, talk to your health care provider about having an MRI and a mammogram every year.  Breast cancer gene (BRCA) assessment is recommended for women who have family members with BRCA-related cancers. BRCA-related cancers include: ? Breast. ? Ovarian. ? Tubal. ? Peritoneal cancers.  Results of the assessment will determine the need for genetic counseling and BRCA1 and BRCA2 testing.  Cervical Cancer Your health care provider may recommend that you be screened regularly for cancer of the pelvic organs (ovaries, uterus, and vagina). This screening involves a pelvic examination, including checking for microscopic changes to the surface of your cervix (Pap test). You may be encouraged to have this screening done every 3 years, beginning at age 20.  For women ages 41-65, health care providers may recommend pelvic exams and Pap testing every 3 years, or they may recommend the Pap and pelvic exam, combined with testing for human papilloma virus (HPV), every 5 years. Some types of HPV increase your risk of cervical cancer. Testing for HPV may also be done on  women of any age with unclear Pap test results.  Other health care providers may not recommend any screening for nonpregnant women who are considered low risk for pelvic cancer and who do not have symptoms. Ask your health care provider if a screening pelvic exam is right for you.  If you have had past treatment for cervical cancer or a condition that could lead to cancer, you need Pap tests and screening for cancer for at least 20 years after your treatment. If Pap tests have been discontinued, your risk factors (such as having a new sexual partner) need to be reassessed to determine if screening should resume. Some women have medical problems that increase the chance of getting cervical cancer. In these cases, your health care provider may recommend more frequent screening and Pap tests.  Colorectal Cancer  This type of cancer can be detected and often prevented.  Routine colorectal cancer screening usually begins at  68 years of age and continues through 68 years of age.  Your health care provider may recommend screening at an earlier age if you have risk factors for colon cancer.  Your health care provider may also recommend using home test kits to check for hidden blood in the stool.  A small camera at the end of a tube can be used to examine your colon directly (sigmoidoscopy or colonoscopy). This is done to check for the earliest forms of colorectal cancer.  Routine screening usually begins at age 63.  Direct examination of the colon should be repeated every 5-10 years through 68 years of age. However, you may need to be screened more often if early forms of precancerous polyps or small growths are found.  Skin Cancer  Check your skin from head to toe regularly.  Tell your health care provider about any new moles or changes in moles, especially if there is a change in a mole's shape or color.  Also tell your health care provider if you have a mole that is larger than the size of a  pencil eraser.  Always use sunscreen. Apply sunscreen liberally and repeatedly throughout the day.  Protect yourself by wearing long sleeves, pants, a wide-brimmed hat, and sunglasses whenever you are outside.  Heart disease, diabetes, and high blood pressure  High blood pressure causes heart disease and increases the risk of stroke. High blood pressure is more likely to develop in: ? People who have blood pressure in the high end of the normal range (130-139/85-89 mm Hg). ? People who are overweight or obese. ? People who are African American.  If you are 56-63 years of age, have your blood pressure checked every 3-5 years. If you are 93 years of age or older, have your blood pressure checked every year. You should have your blood pressure measured twice-once when you are at a hospital or clinic, and once when you are not at a hospital or clinic. Record the average of the two measurements. To check your blood pressure when you are not at a hospital or clinic, you can use: ? An automated blood pressure machine at a pharmacy. ? A home blood pressure monitor.  If you are between 41 years and 54 years old, ask your health care provider if you should take aspirin to prevent strokes.  Have regular diabetes screenings. This involves taking a blood sample to check your fasting blood sugar level. ? If you are at a normal weight and have a low risk for diabetes, have this test once every three years after 68 years of age. ? If you are overweight and have a high risk for diabetes, consider being tested at a younger age or more often. Preventing infection Hepatitis B  If you have a higher risk for hepatitis B, you should be screened for this virus. You are considered at high risk for hepatitis B if: ? You were born in a country where hepatitis B is common. Ask your health care provider which countries are considered high risk. ? Your parents were born in a high-risk country, and you have not been  immunized against hepatitis B (hepatitis B vaccine). ? You have HIV or AIDS. ? You use needles to inject street drugs. ? You live with someone who has hepatitis B. ? You have had sex with someone who has hepatitis B. ? You get hemodialysis treatment. ? You take certain medicines for conditions, including cancer, organ transplantation, and autoimmune conditions.  Hepatitis C  Blood testing is recommended for: ? Everyone born from [birth year] through 1966. ? Anyone with known risk factors for hepatitis C.  Sexually transmitted infections (STIs)  You should be screened for sexually transmitted infections (STIs) including gonorrhea and chlamydia if: ? You are sexually active and are younger than 68 years of age. ? You are older than 68 years of age and your health care provider tells you that you are at risk for this type of infection. ? Your sexual activity has changed since you were last screened and you are at an increased risk for chlamydia or gonorrhea. Ask your health care provider if you are at risk.  If you do not have HIV, but are at risk, it may be recommended that you take a prescription medicine daily to prevent HIV infection. This is called pre-exposure prophylaxis (PrEP). You are considered at risk if: ? You are sexually active and do not regularly use condoms or know the HIV status of your partner(s). ? You take drugs by injection. ? You are sexually active with a partner who has HIV.  Talk with your health care provider about whether you are at high risk of being infected with HIV. If you choose to begin PrEP, you should first be tested for HIV. You should then be tested every 3 months for as long as you are taking PrEP. Pregnancy  If you are premenopausal and you may become pregnant, ask your health care provider about preconception counseling.  If you may become pregnant, take 400 to 800 micrograms (mcg) of folic acid every day.  If you want to prevent pregnancy, talk to your  health care provider about birth control (contraception). Osteoporosis and menopause  Osteoporosis is a disease in which the bones lose minerals and strength with aging. This can result in serious bone fractures. Your risk for osteoporosis can be identified using a bone density scan.  If you are 90 years of age or older, or if you are at risk for osteoporosis and fractures, ask your health care provider if you should be screened.  Ask your health care provider whether you should take a calcium or vitamin D supplement to lower your risk for osteoporosis.  Menopause may have certain physical symptoms and risks.  Hormone replacement therapy may reduce some of these symptoms and risks. Talk to your health care provider about whether hormone replacement therapy is right for you. Follow these instructions at home:  Schedule regular health, dental, and eye exams.  Stay current with your immunizations.  Do not use any tobacco products including cigarettes, chewing tobacco, or electronic cigarettes.  If you are pregnant, do not drink alcohol.  If you are breastfeeding, limit how much and how often you drink alcohol.  Limit alcohol intake to no more than 1 drink per day for nonpregnant women. One drink equals 12 ounces of beer, 5 ounces of [place] Lubitz, or 1 ounces of hard liquor.  Do not use street drugs.  Do not share needles.  Ask your health care provider for help if you need support or information about quitting drugs.  Tell your health care provider if you often feel depressed.  Tell your health care provider if you have ever been abused or do not feel safe at home. This information is not intended to replace advice given to you by your health care provider. Make sure you discuss any questions you have with your health care provider. Document Released: 07/16/2010 Document Revised: 06/08/2015 Document Reviewed: 10/04/2014 Elsevier Interactive Patient  Education  2018 [employer] that are packaged or in containers have a Nutrition Facts panel on the side or back. This is commonly called the food label. The food label helps you make healthy food choices by providing information about serving size and the amount of calories and various nutrients in the food. You can check the food label to find out if the food contains high or low amounts of nutrients that you want to limit in your diet. You can also use the food label to see if the food is a good source of the nutrients that you want to make sure are included in your diet. How do I read the food label?  Begin by checking the serving size and number of servings in the container. All of the nutrition information listed on the food label is based on one serving. If you eat more than one serving, you must multiply the amounts (such as calories, grams of saturated fat, or milligrams of sodium) by the number of servings.  Check the calories. Choosing foods that are low in calories can help you manage your weight.  Look at the numbers in the % Daily Value column for each listed nutrient. This gives you an idea of how much of the daily recommended amount for that nutrient is provided in one serving of the food. A daily value of 5% or less is considered low. A daily value of 20% or more is considered high.  Check the amounts for the items you should limit in your diet. These include: ? Total fat. ? Saturated fat. ? Trans fat. ? Cholesterol. ? Sodium.  Check the amounts for the items you should make sure you get enough of. These include: ? Dietary fiber. ? Vitamins A and C. ? Calcium. ? Iron. What information is provided on the food label? Serving information  Serving size. ? The serving size is listed in cups or pieces. The nutrient amounts listed on the food label apply to this amount of the food.  Servings per container or package. ? This shows the number of servings you can expect to get from the  container or package if you follow the suggested serving size. Amount per serving  Calories. ? The number of calories in one serving of the food. This information is helpful in managing weight. Low-calorie foods contain 40 calories or less. High-calorie foods contain 400 or more calories.  Calories from fat. ? The number of calories that come from fat in one serving. Percent daily value Percent daily value (shown on the label as % Daily Value) tells you what percent of the daily value for each nutrient one serving provides. The daily value is the recommended amount of the nutrient that you should get each day. For example, if 15% is listed next to dietary fiber, it means that one serving of the food will give you 15% of the recommended amount of fiber that you should get in a day. The daily values are based on a 2,000-calorie-per-day diet. You may get more or less than 2,000 calories in your diet each day, but the % Daily Value gives you an idea of whether the food contains a high or low amount of the listed nutrient. A daily value of 5% or less is low. A daily value of 20% or more is high. Total fat Total fat shows you the total amount of fat in one serving (listed in grams). Foods with high amounts of  fat usually have higher calories and may lead to weight gain. Two of the fats that make up a portion of the total fat are included on the label:  Saturated fat. ? This number is the amount of saturated fat in one serving (listed in grams). Saturated fat increases the amount of blood cholesterol and should be limited to less than 7% of total calories each day. This means that if you eat 2,000 calories each day, you should eat less than 140 calories from saturated fat.  Trans fat. ? The food label shows the number of grams (g) per serving. This type of fat is the most unhealthy fat for heart health. It is recommended that people limit their intake of trans fat to as little as possible. Look for foods  that have "0g Trans Fat" on the label.  Cholesterol The amount of cholesterol in one serving is listed in milligrams. Cholesterol should be limited to no more than 300 mg each day. Sodium The amount of sodium in one serving is listed in milligrams. Most people should limit their sodium intake to 2,300 mg a day. Total carbohydrate This number shows the amount of total carbohydrate in one serving (listed in grams). This information can help people with diabetes manage the amount of carbohydrate they eat. Two of the carbohydrates that make up a portion of the total carbohydrate are included on the label:  Dietary fiber. ? The amount of dietary fiber in one serving is listed in grams. Most people should eat at least 25 g of dietary fiber each day.  Sugars. ? The amount of sugar in one serving is also listed in grams. This value includes both naturally occurring sugars from fruit and milk and added sugars such as honey or table sugar.  Protein The amount of protein in one serving is listed in grams. What other important labeling is on the food package? Ingredients Food labels will list each ingredient in the food. The first ingredient listed is the ingredient that the food has the most of. The ingredients are listed in the order of their amount by weight from highest to lowest. Food allergen labeling Food labels may also include a food allergen warning. Listed here are ingredients that can cause allergic reactions in some people. The potential allergens are listed behind the word "Contains" or "May contain." Examples of ingredients that may be listed are wheat, dairy, eggs, soy, and nuts. If a person knows that he or she is allergic to one of these ingredients, he or she will know to avoid that food. Where to find more information:  U.S. Food and Drug Administration: [employer] This information is not intended to replace advice given to you by your health care provider. Make sure you discuss any  questions you have with your health care provider. Document Released: 12/31/2004 Document Revised: 08/30/2015 Document Reviewed: 11/23/2012 Elsevier Interactive Patient Education  2017 [employer].

## 2017-07-01 NOTE — Progress Notes (Signed)
Medical screening examination/treatment/procedure(s) were performed by non-physician practitioner and as supervising physician I was immediately available for consultation/collaboration. I agree with above. Elizabeth A Crawford, MD 

## 2017-07-01 NOTE — Telephone Encounter (Signed)
Information given

## 2017-07-01 NOTE — Telephone Encounter (Signed)
Copied from CRM [phone]#[id number]. Topic: Inquiry >> Jul 01, 2017 11:36 AM Tamela OddiMartin, [place], NT wrote: Reason for CRM: [facility]  [facility]  and also if the patient had a Creatine or a U/A this year. Please call to  provide this information. CB # [phone]

## 2017-07-02 ENCOUNTER — Other Ambulatory Visit: Payer: Self-pay | Admitting: Internal Medicine

## 2017-08-25 ENCOUNTER — Telehealth: Payer: Self-pay | Admitting: Internal Medicine

## 2017-08-25 MED ORDER — EZETIMIBE 10 MG PO TABS: 10.0000 mg | ORAL_TABLET | Freq: Every day | ORAL | 3 refills | Status: DC

## 2017-08-25 MED ORDER — SIMVASTATIN 20 MG PO TABS
20.0000 mg | ORAL_TABLET | Freq: Every day | ORAL | 3 refills | Status: DC
Start: 2017-08-25 — End: 2018-05-22

## 2017-08-25 MED ORDER — SIMVASTATIN 20 MG PO TABS: 20.0000 mg | ORAL_TABLET | Freq: Every day | ORAL | 3 refills | Status: DC

## 2017-08-25 NOTE — Telephone Encounter (Signed)
Sent in the 2 separate medicines that are in vytorin which are simvastatin 20 mg (1 pill daily) and zetia (1 pill daily) which are both generic and should be cheaper.

## 2017-08-25 NOTE — Telephone Encounter (Signed)
Patient informed and stated understanding.

## 2017-08-25 NOTE — Telephone Encounter (Signed)
Patient would like to try generic form of VYTORIN 10-20 MG tablet due to cost. If possible, please sent to [facility].

## 2017-10-06 ENCOUNTER — Other Ambulatory Visit: Payer: Self-pay | Admitting: Internal Medicine

## 2017-10-06 DIAGNOSIS — Z1231 Encounter for screening mammogram for malignant neoplasm of breast: Secondary | ICD-10-CM

## 2017-10-07 ENCOUNTER — Other Ambulatory Visit: Payer: Self-pay | Admitting: Obstetrics and Gynecology

## 2017-10-07 DIAGNOSIS — Z803 Family history of malignant neoplasm of breast: Secondary | ICD-10-CM

## 2017-10-12 ENCOUNTER — Ambulatory Visit
Admission: RE | Admit: 2017-10-12 | Discharge: 2017-10-12 | Disposition: A | Discharge status: Home / Self Care | Payer: Medicare Other | Source: Ambulatory Visit | Attending: Obstetrics and Gynecology | Admitting: Obstetrics and Gynecology

## 2017-10-12 DIAGNOSIS — Z803 Family history of malignant neoplasm of breast: Secondary | ICD-10-CM

## 2017-10-12 MED ORDER — GADOBENATE DIMEGLUMINE 529 MG/ML IV SOLN
16.0000 mL | Freq: Once | INTRAVENOUS | Status: AC | PRN
  Administered 2017-10-12: 16 mL via INTRAVENOUS

## 2017-10-15 ENCOUNTER — Encounter: Payer: Medicare Other | Admitting: Internal Medicine

## 2017-10-21 ENCOUNTER — Ambulatory Visit (INDEPENDENT_AMBULATORY_CARE_PROVIDER_SITE_OTHER): Payer: Medicare Other | Admitting: Internal Medicine

## 2017-10-21 ENCOUNTER — Other Ambulatory Visit (INDEPENDENT_AMBULATORY_CARE_PROVIDER_SITE_OTHER): Payer: Medicare Other

## 2017-10-21 ENCOUNTER — Encounter: Payer: Self-pay | Admitting: Internal Medicine

## 2017-10-21 VITALS — BP 110/78 | HR 74 | Temp 98.4°F | Ht 65.0 in | Wt 174.0 lb

## 2017-10-21 DIAGNOSIS — Z0000 Encounter for general adult medical examination without abnormal findings: Secondary | ICD-10-CM

## 2017-10-21 DIAGNOSIS — I10 Essential (primary) hypertension: Secondary | ICD-10-CM

## 2017-10-21 DIAGNOSIS — E785 Hyperlipidemia, unspecified: Secondary | ICD-10-CM | POA: Diagnosis not present

## 2017-10-21 DIAGNOSIS — Z23 Encounter for immunization: Secondary | ICD-10-CM | POA: Diagnosis not present

## 2017-10-21 DIAGNOSIS — E119 Type 2 diabetes mellitus without complications: Secondary | ICD-10-CM

## 2017-10-21 LAB — COMPREHENSIVE METABOLIC PANEL
ALT: 22 U/L (ref 0–35)
AST: 21 U/L (ref 0–37)
Albumin: 4.1 g/dL (ref 3.5–5.2)
Alkaline Phosphatase: 97 U/L (ref 39–117)
BUN: 11 mg/dL (ref 6–23)
CO2: 29 mEq/L (ref 19–32)
CREATININE: 0.71 mg/dL (ref 0.40–1.20)
Calcium: 9.3 mg/dL (ref 8.4–10.5)
Chloride: 104 mEq/L (ref 96–112)
GFR: 105.13 mL/min (ref >60.00)
GLUCOSE: 134 mg/dL — AB (ref 70–99)
Potassium: 3.4 mEq/L — ABNORMAL LOW (ref 3.5–5.1)
SODIUM: 141 meq/L (ref 135–145)
TOTAL PROTEIN: 7.6 g/dL (ref 6.0–8.3)
Total Bilirubin: 0.4 mg/dL (ref 0.2–1.2)

## 2017-10-21 LAB — CBC
HCT: 43 % (ref 36.0–46.0)
HEMOGLOBIN: 13.8 g/dL (ref 12.0–15.0)
MCHC: 32.2 g/dL (ref 30.0–36.0)
MCV: 85.2 fl (ref 78.0–100.0)
Platelets: 306 10*3/uL (ref 150.0–400.0)
RBC: 5.05 Mil/uL (ref 3.87–5.11)
RDW: 15.3 % (ref 11.5–15.5)
WBC: 5.9 10*3/uL (ref 4.0–10.5)

## 2017-10-21 LAB — TSH: TSH: 1.39 u[IU]/mL (ref 0.35–4.50)

## 2017-10-21 LAB — LIPID PANEL
CHOL/HDL RATIO: 3
Cholesterol: 152 mg/dL (ref 0–200)
HDL: 54.1 mg/dL (ref >39.00)
LDL CALC: 80 mg/dL (ref 0–99)
NonHDL: 97.88
TRIGLYCERIDES: 88 mg/dL (ref 0.0–149.0)
VLDL: 17.6 mg/dL (ref 0.0–40.0)

## 2017-10-21 LAB — HEMOGLOBIN A1C: Hgb A1c MFr Bld: 6.9 % — ABNORMAL HIGH (ref 4.6–6.5)

## 2017-10-21 LAB — MICROALBUMIN / CREATININE URINE RATIO
CREATININE, U: 211.4 mg/dL
Microalb Creat Ratio: 0.7 mg/g (ref 0.0–30.0)
Microalb, Ur: 1.4 mg/dL (ref 0.0–1.9)

## 2017-10-21 MED ORDER — ZOSTER VAC RECOMB ADJUVANTED 50 MCG/0.5ML IM SUSR: 0.5000 mL | Freq: Once | INTRAMUSCULAR | 1 refills | Status: AC

## 2017-10-21 NOTE — Patient Instructions (Signed)

## 2017-10-21 NOTE — Progress Notes (Signed)
Subjective:    Patient ID: Anna Cooper, female    DOB: [birth year], 67 y.o.   MRN: [id number]  HPI The patient is a 67 YO female coming in for physical.   PMH, [facility], social history reviewed and updated.   Review of Systems  Constitutional: Negative.   HENT: Negative.   Eyes: Negative.   Respiratory: Negative for cough, chest tightness and shortness of breath.   Cardiovascular: Negative for chest pain, palpitations and leg swelling.  Gastrointestinal: Negative for abdominal distention, abdominal pain, constipation, diarrhea, nausea and vomiting.  Musculoskeletal: Negative.   Skin: Negative.   Neurological: Negative.   Psychiatric/Behavioral: Negative.       Objective:   Physical Exam  Constitutional: She is oriented to person, place, and time. She appears well-developed and well-nourished.  HENT:  Head: Normocephalic and atraumatic.  Eyes: EOM are normal.  Neck: Normal range of motion.  Cardiovascular: Normal rate and regular rhythm.  Pulmonary/Chest: Effort normal and breath sounds normal. No respiratory distress. She has no wheezes. She has no rales.  Abdominal: Soft. Bowel sounds are normal. She exhibits no distension. There is no tenderness. There is no rebound.  Musculoskeletal: She exhibits no edema.  Neurological: She is alert and oriented to person, place, and time. Coordination normal.  Skin: Skin is warm and dry.  Foot exam done  Psychiatric: She has a normal mood and affect.   Vitals:   10/21/17 0818  BP: 110/78  Pulse: 74  Temp: 98.4 F (36.9 C)  TempSrc: Oral  SpO2: 96%  Weight: 174 lb (78.9 kg)  Height: 5\' 5"  (1.651 m)      Assessment & Plan:  Flu shot and pneumonia 23 given at visit

## 2017-10-21 NOTE — Assessment & Plan Note (Signed)
BP at goal on amlodipine and hctz. If needed in the future would add ACE-I due to concurrent diabetes.

## 2017-10-21 NOTE — Assessment & Plan Note (Signed)
Flu shot given. Pneumonia given 23 to complete series. Shingrix given rx. Tetanus up to date. Colonoscopy up to date. Mammogram up to date, pap smear aged out and dexa due 2020. Counseled about sun safety and mole surveillance. Counseled about the dangers of distracted driving. Given 10 year screening recommendations.

## 2017-10-21 NOTE — Assessment & Plan Note (Signed)
Checking lipid panel and adjust zetia and simvastatin as needed for goal LDL <100.

## 2017-10-21 NOTE — Assessment & Plan Note (Signed)
Checking HgA1c, foot exam done. Eye exam up to date. Checking microalbumin to creatinine ratio. Taking statin, not on ARB/ACE-I currently due to no microalbuminuria. Controlled by diet and adjust as needed for goal HgA1c <7.5.

## 2018-01-23 ENCOUNTER — Other Ambulatory Visit: Payer: Self-pay | Admitting: Internal Medicine

## 2018-02-11 ENCOUNTER — Ambulatory Visit
Admission: RE | Admit: 2018-02-11 | Discharge: 2018-02-11 | Disposition: A | Discharge status: Home / Self Care | Payer: Medicare Other | Source: Ambulatory Visit | Attending: Internal Medicine | Admitting: Internal Medicine

## 2018-02-11 DIAGNOSIS — Z1231 Encounter for screening mammogram for malignant neoplasm of breast: Secondary | ICD-10-CM

## 2018-02-12 ENCOUNTER — Other Ambulatory Visit: Payer: Self-pay | Admitting: Internal Medicine

## 2018-02-12 DIAGNOSIS — N644 Mastodynia: Secondary | ICD-10-CM

## 2018-02-19 ENCOUNTER — Other Ambulatory Visit: Payer: Self-pay | Admitting: Otolaryngology

## 2018-02-19 ENCOUNTER — Ambulatory Visit
Admission: RE | Admit: 2018-02-19 | Discharge: 2018-02-19 | Disposition: A | Discharge status: Home / Self Care | Payer: Medicare Other | Source: Ambulatory Visit | Attending: Internal Medicine | Admitting: Internal Medicine

## 2018-02-19 DIAGNOSIS — N644 Mastodynia: Secondary | ICD-10-CM

## 2018-02-19 DIAGNOSIS — E079 Disorder of thyroid, unspecified: Secondary | ICD-10-CM

## 2018-02-20 ENCOUNTER — Ambulatory Visit
Admission: RE | Admit: 2018-02-20 | Discharge: 2018-02-20 | Disposition: A | Discharge status: Home / Self Care | Payer: Medicare Other | Source: Ambulatory Visit | Attending: Otolaryngology | Admitting: Otolaryngology

## 2018-02-20 DIAGNOSIS — E079 Disorder of thyroid, unspecified: Secondary | ICD-10-CM

## 2018-03-20 ENCOUNTER — Other Ambulatory Visit: Payer: Self-pay | Admitting: Internal Medicine

## 2018-05-22 ENCOUNTER — Other Ambulatory Visit: Payer: Self-pay | Admitting: Internal Medicine

## 2018-08-19 ENCOUNTER — Other Ambulatory Visit: Payer: Self-pay | Admitting: Internal Medicine

## 2018-08-19 DIAGNOSIS — Z1231 Encounter for screening mammogram for malignant neoplasm of breast: Secondary | ICD-10-CM

## 2018-10-02 DIAGNOSIS — E079 Disorder of thyroid, unspecified: Secondary | ICD-10-CM | POA: Insufficient documentation

## 2018-10-05 DIAGNOSIS — Z9189 Other specified personal risk factors, not elsewhere classified: Secondary | ICD-10-CM | POA: Insufficient documentation

## 2018-10-06 ENCOUNTER — Other Ambulatory Visit: Payer: Self-pay | Admitting: Obstetrics and Gynecology

## 2018-10-06 DIAGNOSIS — Z9189 Other specified personal risk factors, not elsewhere classified: Secondary | ICD-10-CM

## 2018-10-14 ENCOUNTER — Ambulatory Visit
Admission: RE | Admit: 2018-10-14 | Discharge: 2018-10-14 | Disposition: A | Discharge status: Home / Self Care | Payer: Medicare Other | Source: Ambulatory Visit | Attending: Obstetrics and Gynecology | Admitting: Obstetrics and Gynecology

## 2018-10-14 DIAGNOSIS — Z9189 Other specified personal risk factors, not elsewhere classified: Secondary | ICD-10-CM

## 2018-10-14 MED ORDER — GADOBUTROL 1 MMOL/ML IV SOLN
8.0000 mL | Freq: Once | INTRAVENOUS | Status: AC | PRN
  Administered 2018-10-14: 8 mL via INTRAVENOUS

## 2018-10-15 ENCOUNTER — Other Ambulatory Visit: Payer: Self-pay | Admitting: Obstetrics and Gynecology

## 2018-10-15 DIAGNOSIS — R9389 Abnormal findings on diagnostic imaging of other specified body structures: Secondary | ICD-10-CM

## 2018-10-23 ENCOUNTER — Other Ambulatory Visit: Payer: Self-pay

## 2018-10-23 ENCOUNTER — Encounter: Payer: Self-pay | Admitting: Internal Medicine

## 2018-10-23 ENCOUNTER — Other Ambulatory Visit (INDEPENDENT_AMBULATORY_CARE_PROVIDER_SITE_OTHER): Payer: Medicare Other

## 2018-10-23 ENCOUNTER — Ambulatory Visit (INDEPENDENT_AMBULATORY_CARE_PROVIDER_SITE_OTHER): Payer: Medicare Other | Admitting: Internal Medicine

## 2018-10-23 VITALS — BP 110/70 | HR 69 | Temp 98.5°F | Ht 65.0 in | Wt 178.0 lb

## 2018-10-23 DIAGNOSIS — E119 Type 2 diabetes mellitus without complications: Secondary | ICD-10-CM

## 2018-10-23 DIAGNOSIS — E1169 Type 2 diabetes mellitus with other specified complication: Secondary | ICD-10-CM

## 2018-10-23 DIAGNOSIS — Z0000 Encounter for general adult medical examination without abnormal findings: Secondary | ICD-10-CM

## 2018-10-23 DIAGNOSIS — E785 Hyperlipidemia, unspecified: Secondary | ICD-10-CM

## 2018-10-23 DIAGNOSIS — I10 Essential (primary) hypertension: Secondary | ICD-10-CM | POA: Diagnosis not present

## 2018-10-23 DIAGNOSIS — Z23 Encounter for immunization: Secondary | ICD-10-CM

## 2018-10-23 LAB — HEMOGLOBIN A1C: Hgb A1c MFr Bld: 7.7 % — ABNORMAL HIGH (ref 4.6–6.5)

## 2018-10-23 LAB — TSH: TSH: 0.96 u[IU]/mL (ref 0.35–4.50)

## 2018-10-23 LAB — CBC
HCT: 42 % (ref 36.0–46.0)
Hemoglobin: 13.7 g/dL (ref 12.0–15.0)
MCHC: 32.6 g/dL (ref 30.0–36.0)
MCV: 85 fl (ref 78.0–100.0)
Platelets: 286 10*3/uL (ref 150.0–400.0)
RBC: 4.94 Mil/uL (ref 3.87–5.11)
RDW: 14.2 % (ref 11.5–15.5)
WBC: 6 10*3/uL (ref 4.0–10.5)

## 2018-10-23 LAB — MICROALBUMIN / CREATININE URINE RATIO
Creatinine,U: 97.7 mg/dL
Microalb Creat Ratio: 0.7 mg/g (ref 0.0–30.0)
Microalb, Ur: <0.7 mg/dL (ref 0.0–1.9)

## 2018-10-23 LAB — COMPREHENSIVE METABOLIC PANEL
ALT: 27 U/L (ref 0–35)
AST: 23 U/L (ref 0–37)
Albumin: 4 g/dL (ref 3.5–5.2)
Alkaline Phosphatase: 98 U/L (ref 39–117)
BUN: 10 mg/dL (ref 6–23)
CO2: 28 mEq/L (ref 19–32)
Calcium: 9.5 mg/dL (ref 8.4–10.5)
Chloride: 103 mEq/L (ref 96–112)
Creatinine, Ser: 0.69 mg/dL (ref 0.40–1.20)
GFR: 101.92 mL/min (ref >60.00)
Glucose, Bld: 134 mg/dL — ABNORMAL HIGH (ref 70–99)
Potassium: 3.7 mEq/L (ref 3.5–5.1)
Sodium: 139 mEq/L (ref 135–145)
Total Bilirubin: 0.4 mg/dL (ref 0.2–1.2)
Total Protein: 7.1 g/dL (ref 6.0–8.3)

## 2018-10-23 LAB — LIPID PANEL
Cholesterol: 142 mg/dL (ref 0–200)
HDL: 47.4 mg/dL (ref >39.00)
LDL Cholesterol: 75 mg/dL (ref 0–99)
NonHDL: 94.11
Total CHOL/HDL Ratio: 3
Triglycerides: 97 mg/dL (ref 0.0–149.0)
VLDL: 19.4 mg/dL (ref 0.0–40.0)

## 2018-10-23 LAB — T4, FREE: Free T4: 0.68 ng/dL (ref 0.60–1.60)

## 2018-10-23 NOTE — Assessment & Plan Note (Signed)
Checking lipid panel and adjust simvastatin as needed.  

## 2018-10-23 NOTE — Assessment & Plan Note (Signed)
Taking amlodipine and hctz and BP at goal. Checking CMP and adjust as needed.  

## 2018-10-23 NOTE — Progress Notes (Signed)
Subjective:   Patient ID: Anna Cooper, female    DOB: [birth year], 68 y.o.   MRN: [id number]  HPI Here for medicare wellness and physical, no new complaints. Please see A/P for status and treatment of chronic medical problems.   Diet: DM since diabetic Physical activity: sedentary Depression/mood screen: negative Hearing: intact to whispered voice, mild tinnitus Visual acuity: grossly normal, performs annual eye exam  ADLs: capable Fall risk: none Home safety: good Cognitive evaluation: intact to orientation, naming, recall and repetition EOL planning: adv directives discussed    Office Visit from 10/23/2018 in [facility]  PHQ-2 Total Score  0        Office Visit from 10/11/2016 in [facility]  PHQ-9 Total Score  3      I have personally reviewed and have noted 1. The patient's medical and social history - reviewed today no changes 2. Their use of alcohol, tobacco or illicit drugs 3. Their current medications and supplements 4. The patient's functional ability including ADL's, fall risks, home safety risks and hearing or visual impairment. 5. Diet and physical activities 6. Evidence for depression or mood disorders 7. Care team reviewed and updated  Patient Care Team: Hoyt Koch, MD as PCP - General (Internal Medicine) Newt Minion, MD as Consulting Physician (Orthopedic Surgery) Marylynn Pearson, MD as Consulting Physician (Ophthalmology) Past Medical History:  Diagnosis Date  . Fuchs' corneal dystrophy    s/p  bilateral corneal transplant at [place] -- right 09-24-2011 and left 02-11-2012  . History of recurrent UTIs   . History of venereal warts   . Hyperlipidemia   . Hypertension   . Infantile paralysis   . Major depression   . PMB (postmenopausal bleeding)   . S/P removal of thyroid nodule 01/14/2017  . Type 2 diabetes mellitus ([place]) 03/31/2016   not on meds currently   Past Surgical History:   Procedure Laterality Date  . BREAST BIOPSY Right   . BUNIONECTOMY WITH HAMMERTOE RECONSTRUCTION Bilateral right 07/ 2013;  left 11/ 2013  . 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 COLONOSCOPY    . DILATATION & CURETTAGE/HYSTEROSCOPY WITH MYOSURE N/A 06/24/2016   Procedure: DILATATION & CURETTAGE/HYSTEROSCOPY WITH MYOSURE;  Surgeon: Tyson Dense, MD;  Location: [facility];  Service: Gynecology;  Laterality: N/A;  . TUBAL LIGATION  1980's   Family History  Problem Relation Age of Onset  . Diabetes Mother   . Breast cancer Mother 8       dx late 70's, was in remission but had relapse with mets. Died at 54  . Heart attack Father   . Seizures Father   . Heart disease Father        CAD/MI-fatal  . Breast cancer Sister 76       dx early 14's, BRCA2+, now is 73  . Breast cancer Sister 39       dx. in 40's, BRCA2-,   . Cancer Maternal Grandfather        Lung Cancer  . Breast cancer Maternal Grandfather   . Prostate cancer Maternal Uncle 38       dx and died in his 67's  . Colon cancer Maternal Aunt 45       dx in 55's, had 37 in of colon removed, died in  Subjective:   Patient ID: Anna Cooper, female    DOB: [birth year], 68 y.o.   MRN: [id number]  HPI Here for medicare wellness and physical, no new complaints. Please see A/P for status and treatment of chronic medical problems.   Diet: DM since diabetic Physical activity: sedentary Depression/mood screen: negative Hearing: intact to whispered voice, mild tinnitus Visual acuity: grossly normal, performs annual eye exam  ADLs: capable Fall risk: none Home safety: good Cognitive evaluation: intact to orientation, naming, recall and repetition EOL planning: adv directives discussed    Office Visit from 10/23/2018 in [facility]  PHQ-2 Total Score  0        Office Visit from 10/11/2016 in [facility]  PHQ-9 Total Score  3      I have personally reviewed and have noted 1. The patient's medical and social history - reviewed today no changes 2. Their use of alcohol, tobacco or illicit drugs 3. Their current medications and supplements 4. The patient's functional ability including ADL's, fall risks, home safety risks and hearing or visual impairment. 5. Diet and physical activities 6. Evidence for depression or mood disorders 7. Care team reviewed and updated  Patient Care Team: Hoyt Koch, MD as PCP - General (Internal Medicine) Newt Minion, MD as Consulting Physician (Orthopedic Surgery) Marylynn Pearson, MD as Consulting Physician (Ophthalmology) Past Medical History:  Diagnosis Date  . Fuchs' corneal dystrophy    s/p  bilateral corneal transplant at [place] [place] -- right 09-24-2011 and left 02-11-2012  . History of recurrent UTIs   . History of venereal warts   . Hyperlipidemia   . Hypertension   . Infantile paralysis   . Major depression   . PMB (postmenopausal bleeding)   . S/P removal of thyroid nodule 01/14/2017  . Type 2 diabetes mellitus ([place]) 03/31/2016   not on meds currently   Past Surgical History:   Procedure Laterality Date  . BREAST BIOPSY Right   . BUNIONECTOMY WITH HAMMERTOE RECONSTRUCTION Bilateral right 07/ 2013;  left 11/ 2013  . 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 COLONOSCOPY    . DILATATION & CURETTAGE/HYSTEROSCOPY WITH MYOSURE N/A 06/24/2016   Procedure: DILATATION & CURETTAGE/HYSTEROSCOPY WITH MYOSURE;  Surgeon: Tyson Dense, MD;  Location: [facility];  Service: Gynecology;  Laterality: N/A;  . TUBAL LIGATION  1980's   Family History  Problem Relation Age of Onset  . Diabetes Mother   . Breast cancer Mother 8       dx late 70's, was in remission but had relapse with mets. Died at 54  . Heart attack Father   . Seizures Father   . Heart disease Father        CAD/MI-fatal  . Breast cancer Sister 76       dx early 14's, BRCA2+, now is 73  . Breast cancer Sister 39       dx. in 40's, BRCA2-,   . Cancer Maternal Grandfather        Lung Cancer  . Breast cancer Maternal Grandfather   . Prostate cancer Maternal Uncle 38       dx and died in his 67's  . Colon cancer Maternal Aunt 45       dx in 55's, had 37 in of colon removed, died in

## 2018-10-23 NOTE — Assessment & Plan Note (Signed)
Flu shot given. Pneumonia complete. Shingrix counseled. Tetanus due 2023. Colonoscopy due 2028. Mammogram getting biopsy currently, pap smear aged out and dexa due 2021. Counseled about sun safety and mole surveillance. Counseled about the dangers of distracted driving. Given 10 year screening recommendations.

## 2018-10-23 NOTE — Patient Instructions (Signed)
Health Maintenance, Female Adopting a healthy lifestyle and getting preventive care are important in promoting health and wellness. Ask your health care provider about:  The right schedule for you to have regular tests and exams.  Things you can do on your own to prevent diseases and keep yourself healthy. What should I know about diet, weight, and exercise? Eat a healthy diet   Eat a diet that includes plenty of vegetables, fruits, low-fat dairy products, and lean protein.  Do not eat a lot of foods that are high in solid fats, added sugars, or sodium. Maintain a healthy weight Body mass index (BMI) is used to identify weight problems. It estimates body fat based on height and weight. Your health care provider can help determine your BMI and help you achieve or maintain a healthy weight. Get regular exercise Get regular exercise. This is one of the most important things you can do for your health. Most adults should:  Exercise for at least 150 minutes each week. The exercise should increase your heart rate and make you sweat (moderate-intensity exercise).  Do strengthening exercises at least twice a week. This is in addition to the moderate-intensity exercise.  Spend less time sitting. Even light physical activity can be beneficial. Watch cholesterol and blood lipids Have your blood tested for lipids and cholesterol at 69 years of age, then have this test every 5 years. Have your cholesterol levels checked more often if:  Your lipid or cholesterol levels are high.  You are older than 69 years of age.  You are at high risk for heart disease. What should I know about cancer screening? Depending on your health history and family history, you may need to have cancer screening at various ages. This may include screening for:  Breast cancer.  Cervical cancer.  Colorectal cancer.  Skin cancer.  Lung cancer. What should I know about heart disease, diabetes, and high blood  pressure? Blood pressure and heart disease  High blood pressure causes heart disease and increases the risk of stroke. This is more likely to develop in people who have high blood pressure readings, are of African descent, or are overweight.  Have your blood pressure checked: ? Every 3-5 years if you are 18-39 years of age. ? Every year if you are 40 years old or older. Diabetes Have regular diabetes screenings. This checks your fasting blood sugar level. Have the screening done:  Once every three years after age 40 if you are at a normal weight and have a low risk for diabetes.  More often and at a younger age if you are overweight or have a high risk for diabetes. What should I know about preventing infection? Hepatitis B If you have a higher risk for hepatitis B, you should be screened for this virus. Talk with your health care provider to find out if you are at risk for hepatitis B infection. Hepatitis C Testing is recommended for:  Everyone born from 1945 through 1965.  Anyone with known risk factors for hepatitis C. Sexually transmitted infections (STIs)  Get screened for STIs, including gonorrhea and chlamydia, if: ? You are sexually active and are younger than 69 years of age. ? You are older than 69 years of age and your health care provider tells you that you are at risk for this type of infection. ? Your sexual activity has changed since you were last screened, and you are at increased risk for chlamydia or gonorrhea. Ask your health care provider if   you are at risk.  Ask your health care provider about whether you are at high risk for HIV. Your health care provider may recommend a prescription medicine to help prevent HIV infection. If you choose to take medicine to prevent HIV, you should first get tested for HIV. You should then be tested every 3 months for as long as you are taking the medicine. Pregnancy  If you are about to stop having your period (premenopausal) and  you may become pregnant, seek counseling before you get pregnant.  Take 400 to 800 micrograms (mcg) of folic acid every day if you become pregnant.  Ask for birth control (contraception) if you want to prevent pregnancy. Osteoporosis and menopause Osteoporosis is a disease in which the bones lose minerals and strength with aging. This can result in bone fractures. If you are 65 years old or older, or if you are at risk for osteoporosis and fractures, ask your health care provider if you should:  Be screened for bone loss.  Take a calcium or vitamin D supplement to lower your risk of fractures.  Be given hormone replacement therapy (HRT) to treat symptoms of menopause. Follow these instructions at home: Lifestyle  Do not use any products that contain nicotine or tobacco, such as cigarettes, e-cigarettes, and chewing tobacco. If you need help quitting, ask your health care provider.  Do not use street drugs.  Do not share needles.  Ask your health care provider for help if you need support or information about quitting drugs. Alcohol use  Do not drink alcohol if: ? Your health care provider tells you not to drink. ? You are pregnant, may be pregnant, or are planning to become pregnant.  If you drink alcohol: ? Limit how much you use to 0-1 drink a day. ? Limit intake if you are breastfeeding.  Be aware of how much alcohol is in your drink. In the [place] one drink equals one 12 oz bottle of beer (355 mL), one 5 oz glass of wine (148 mL), or one 1 oz glass of hard liquor (44 mL). General instructions  Schedule regular health, dental, and eye exams.  Stay current with your vaccines.  Tell your health care provider if: ? You often feel depressed. ? You have ever been abused or do not feel safe at home. Summary  Adopting a healthy lifestyle and getting preventive care are important in promoting health and wellness.  Follow your health care provider's instructions about healthy  diet, exercising, and getting tested or screened for diseases.  Follow your health care provider's instructions on monitoring your cholesterol and blood pressure. This information is not intended to replace advice given to you by your health care provider. Make sure you discuss any questions you have with your health care provider. Document Released: 07/16/2010 Document Revised: 12/24/2017 Document Reviewed: 12/24/2017 Elsevier Patient Education  2020 [employer]  

## 2018-10-23 NOTE — Assessment & Plan Note (Signed)
Foot exam done, eye exam up to date. Checking HgA1c and microalbumin to creatinine ratio. On statin. Adjust as needed diet controlled currently.

## 2018-10-27 ENCOUNTER — Ambulatory Visit
Admission: RE | Admit: 2018-10-27 | Discharge: 2018-10-27 | Disposition: A | Discharge status: Home / Self Care | Payer: Medicare Other | Source: Ambulatory Visit | Attending: Obstetrics and Gynecology | Admitting: Obstetrics and Gynecology

## 2018-10-27 ENCOUNTER — Ambulatory Visit: Payer: Medicare Other

## 2018-10-27 ENCOUNTER — Other Ambulatory Visit: Payer: Self-pay

## 2018-10-27 ENCOUNTER — Other Ambulatory Visit: Payer: Self-pay | Admitting: Obstetrics and Gynecology

## 2018-10-27 DIAGNOSIS — R9389 Abnormal findings on diagnostic imaging of other specified body structures: Secondary | ICD-10-CM

## 2018-10-27 MED ORDER — GADOBUTROL 1 MMOL/ML IV SOLN
7.0000 mL | Freq: Once | INTRAVENOUS | Status: AC | PRN
  Administered 2018-10-27: 07:00:00 7 mL via INTRAVENOUS

## 2018-10-29 ENCOUNTER — Other Ambulatory Visit: Payer: Medicare Other

## 2018-12-01 ENCOUNTER — Other Ambulatory Visit: Payer: Self-pay | Admitting: Internal Medicine

## 2019-01-01 ENCOUNTER — Encounter: Payer: Self-pay | Admitting: Internal Medicine

## 2019-01-19 LAB — MICROALBUMIN, URINE: Microalb, Ur: 8.3

## 2019-01-19 LAB — FECAL OCCULT BLOOD, IMMUNOCHEMICAL: IFOBT: NEGATIVE

## 2019-02-22 ENCOUNTER — Other Ambulatory Visit: Payer: Self-pay

## 2019-02-22 ENCOUNTER — Ambulatory Visit
Admission: RE | Admit: 2019-02-22 | Discharge: 2019-02-22 | Disposition: A | Discharge status: Home / Self Care | Payer: Medicare Other | Source: Ambulatory Visit | Attending: Internal Medicine | Admitting: Internal Medicine

## 2019-02-22 DIAGNOSIS — Z1231 Encounter for screening mammogram for malignant neoplasm of breast: Secondary | ICD-10-CM

## 2019-02-24 ENCOUNTER — Other Ambulatory Visit: Payer: Self-pay | Admitting: Internal Medicine

## 2019-03-03 DIAGNOSIS — H40023 Open angle with borderline findings, high risk, bilateral: Secondary | ICD-10-CM | POA: Diagnosis not present

## 2019-03-03 DIAGNOSIS — H40043 Steroid responder, bilateral: Secondary | ICD-10-CM | POA: Diagnosis not present

## 2019-04-27 ENCOUNTER — Telehealth: Payer: Self-pay | Admitting: Internal Medicine

## 2019-04-27 MED ORDER — AMLODIPINE BESYLATE 5 MG PO TABS: 5.0000 mg | ORAL_TABLET | Freq: Every day | ORAL | 3 refills | Status: DC

## 2019-04-27 NOTE — Telephone Encounter (Signed)
amLODipine (NORVASC) 5 MG tablet  Patient is stating she needs a refill on the following medication. That she is out and does not know what happen to the refill on feb. She changes pharmacy in Jan and does not believe she ever got it. Needs the new rx sent to: [facility] - [place], [place] - [address] Phone:  [phone]  Fax:  [phone]

## 2019-04-28 ENCOUNTER — Telehealth: Payer: Self-pay | Admitting: Internal Medicine

## 2019-04-28 NOTE — Telephone Encounter (Signed)
New message:   Anna Cooper is calling to clarify the dosage and the way the pt is supposed to be taking amLODipine (NORVASC) 5 MG tablet

## 2019-04-29 NOTE — Telephone Encounter (Signed)
Called and spoke with Alessandra Bevels gave clarification of medication Amlodipine mg

## 2019-07-27 ENCOUNTER — Other Ambulatory Visit: Payer: Self-pay | Admitting: Internal Medicine

## 2019-09-01 DIAGNOSIS — H40043 Steroid responder, bilateral: Secondary | ICD-10-CM | POA: Diagnosis not present

## 2019-09-27 ENCOUNTER — Telehealth: Payer: Self-pay | Admitting: Internal Medicine

## 2019-09-27 ENCOUNTER — Encounter (HOSPITAL_COMMUNITY): Payer: Self-pay

## 2019-09-27 ENCOUNTER — Other Ambulatory Visit: Payer: Self-pay

## 2019-09-27 ENCOUNTER — Emergency Department (HOSPITAL_COMMUNITY)
Admission: EM | Admit: 2019-09-27 | Discharge: 2019-09-28 | Disposition: A | Discharge status: Home / Self Care | Payer: Medicare PPO | Attending: Emergency Medicine | Admitting: Emergency Medicine

## 2019-09-27 DIAGNOSIS — E119 Type 2 diabetes mellitus without complications: Secondary | ICD-10-CM | POA: Diagnosis not present

## 2019-09-27 DIAGNOSIS — S0990XA Unspecified injury of head, initial encounter: Secondary | ICD-10-CM | POA: Diagnosis not present

## 2019-09-27 DIAGNOSIS — R5383 Other fatigue: Secondary | ICD-10-CM | POA: Insufficient documentation

## 2019-09-27 DIAGNOSIS — Y9389 Activity, other specified: Secondary | ICD-10-CM | POA: Insufficient documentation

## 2019-09-27 DIAGNOSIS — I10 Essential (primary) hypertension: Secondary | ICD-10-CM | POA: Insufficient documentation

## 2019-09-27 DIAGNOSIS — M25512 Pain in left shoulder: Secondary | ICD-10-CM | POA: Insufficient documentation

## 2019-09-27 DIAGNOSIS — R519 Headache, unspecified: Secondary | ICD-10-CM | POA: Diagnosis not present

## 2019-09-27 DIAGNOSIS — W010XXA Fall on same level from slipping, tripping and stumbling without subsequent striking against object, initial encounter: Secondary | ICD-10-CM | POA: Diagnosis not present

## 2019-09-27 DIAGNOSIS — Y9289 Other specified places as the place of occurrence of the external cause: Secondary | ICD-10-CM | POA: Insufficient documentation

## 2019-09-27 DIAGNOSIS — W19XXXA Unspecified fall, initial encounter: Secondary | ICD-10-CM

## 2019-09-27 DIAGNOSIS — Y998 Other external cause status: Secondary | ICD-10-CM | POA: Diagnosis not present

## 2019-09-27 DIAGNOSIS — Z79899 Other long term (current) drug therapy: Secondary | ICD-10-CM | POA: Insufficient documentation

## 2019-09-27 NOTE — ED Triage Notes (Addendum)
Pt arrives to ED w/ c/o mechanical fall on Friday and states she bumped her head on lawn chair. Pt does not take blood thinners. No trauma noted to impact site. Pt denies loc, aox4, neuro intact. Pt reports 6/10 pain. Pt reports feeling lethargic since event. Pt also c/o soreness to L arm.

## 2019-09-27 NOTE — Telephone Encounter (Signed)
Patient called stating she tripped and fell on Friday 9.3.21. She has a bump on her head and has been tired and sleepy since Saturday 9.4.21  Spoke with a Provider--told patient the safest course of action is to go to the ED to make sure she doesn't have bleeding on the brain.  They can also get a CT done there

## 2019-09-28 ENCOUNTER — Emergency Department (HOSPITAL_COMMUNITY): Payer: Medicare PPO

## 2019-09-28 DIAGNOSIS — M25512 Pain in left shoulder: Secondary | ICD-10-CM | POA: Diagnosis not present

## 2019-09-28 DIAGNOSIS — S0990XA Unspecified injury of head, initial encounter: Secondary | ICD-10-CM | POA: Diagnosis not present

## 2019-09-28 LAB — COMPREHENSIVE METABOLIC PANEL
ALT: 22 U/L (ref 0–44)
AST: 25 U/L (ref 15–41)
Albumin: 3.7 g/dL (ref 3.5–5.0)
Alkaline Phosphatase: 90 U/L (ref 38–126)
Anion gap: 9 (ref 5–15)
BUN: 8 mg/dL (ref 8–23)
CO2: 25 mmol/L (ref 22–32)
Calcium: 9.1 mg/dL (ref 8.9–10.3)
Chloride: 104 mmol/L (ref 98–111)
Creatinine, Ser: 0.71 mg/dL (ref 0.44–1.00)
GFR calc Af Amer: >60 mL/min (ref >60)
GFR calc non Af Amer: >60 mL/min (ref >60)
Glucose, Bld: 154 mg/dL — ABNORMAL HIGH (ref 70–99)
Potassium: 3.3 mmol/L — ABNORMAL LOW (ref 3.5–5.1)
Sodium: 138 mmol/L (ref 135–145)
Total Bilirubin: 0.4 mg/dL (ref 0.3–1.2)
Total Protein: 6.6 g/dL (ref 6.5–8.1)

## 2019-09-28 LAB — URINALYSIS, ROUTINE W REFLEX MICROSCOPIC
Bilirubin Urine: NEGATIVE
Glucose, UA: NEGATIVE mg/dL
Hgb urine dipstick: NEGATIVE
Ketones, ur: NEGATIVE mg/dL
Leukocytes,Ua: NEGATIVE
Nitrite: NEGATIVE
Protein, ur: NEGATIVE mg/dL
Specific Gravity, Urine: 1.009 (ref 1.005–1.030)
pH: 5 (ref 5.0–8.0)

## 2019-09-28 LAB — CBC WITH DIFFERENTIAL/PLATELET
Abs Immature Granulocytes: 0.01 10*3/uL (ref 0.00–0.07)
Basophils Absolute: 0 10*3/uL (ref 0.0–0.1)
Basophils Relative: 1 %
Eosinophils Absolute: 0.2 10*3/uL (ref 0.0–0.5)
Eosinophils Relative: 3 %
HCT: 43.2 % (ref 36.0–46.0)
Hemoglobin: 13.7 g/dL (ref 12.0–15.0)
Immature Granulocytes: 0 %
Lymphocytes Relative: 29 %
Lymphs Abs: 1.9 10*3/uL (ref 0.7–4.0)
MCH: 27.6 pg (ref 26.0–34.0)
MCHC: 31.7 g/dL (ref 30.0–36.0)
MCV: 86.9 fL (ref 80.0–100.0)
Monocytes Absolute: 0.5 10*3/uL (ref 0.1–1.0)
Monocytes Relative: 8 %
Neutro Abs: 3.9 10*3/uL (ref 1.7–7.7)
Neutrophils Relative %: 59 %
Platelets: 300 10*3/uL (ref 150–400)
RBC: 4.97 MIL/uL (ref 3.87–5.11)
RDW: 15.3 % (ref 11.5–15.5)
WBC: 6.6 10*3/uL (ref 4.0–10.5)
nRBC: 0 % (ref 0.0–0.2)

## 2019-09-28 NOTE — ED Provider Notes (Signed)
[facility] EMERGENCY DEPARTMENT Provider Note   CSN: [id number] Arrival date & time: 09/27/19  1709     History Chief Complaint  Patient presents with  . Fall    Anna Cooper is a 69 y.o. female.  HPI   Patient with significant medical history of hypertension, hyperlipidemia, type 2 diabetes presents to the emergency department after a mechanical fall that happened on Friday.  Patient states while she was outside cleaning up her patio she tripped over a cord causing her to fall onto her back and left side.  She states she has left shoulder pain and has felt more sleepy.  She admits to did hit her head but denies losing conscious, and is not on anticoags.  Patient denies feeling off balance, headaches, change in vision, paresthesias or weakness in upper or lower extremities.  She denies any alleviating or aggravating factors at this time.  Patient denies headache, fever, chills, shortness of breath, chest pain, abdominal pain, nausea, vomit, diarrhea, pedal edema.  Past Medical History:  Diagnosis Date  . Fuchs' corneal dystrophy    s/p  bilateral corneal transplant at [facility] -- right 09-24-2011 and left 02-11-2012  . History of recurrent UTIs   . History of venereal warts   . Hyperlipidemia   . Hypertension   . Infantile paralysis   . Major depression   . PMB (postmenopausal bleeding)   . S/P removal of thyroid nodule 01/14/2017  . Type 2 diabetes mellitus ([place]) 03/31/2016   not on meds currently    Patient Active Problem List   Diagnosis Date Noted  . Genetic testing 08/30/2016  . Fuchs' corneal dystrophy 07/15/2012  . Pseudophakia of both eyes 07/15/2012  . Routine health maintenance 12/08/2010  . Diabetes mellitus type 2, controlled, without complications ([place]) 59/93/5701  . 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 Hyperlipidemia associated with type 2 diabetes mellitus ([place]) 03/10/2007  . Hypertension 03/10/2007    Past Surgical History:  Procedure  Laterality Date  . BREAST BIOPSY Right   . BUNIONECTOMY WITH HAMMERTOE RECONSTRUCTION Bilateral right 07/ 2013;  left 11/ 2013  . 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 COLONOSCOPY    . DILATATION & CURETTAGE/HYSTEROSCOPY WITH MYOSURE N/A 06/24/2016   Procedure: DILATATION & CURETTAGE/HYSTEROSCOPY WITH MYOSURE;  Surgeon: Tyson Dense, MD;  Location[facility];  Service: Gynecology;  Laterality: N/A;  . TUBAL LIGATION  1980's     OB History   No obstetric history on file.     Family History  Problem Relation Age of Onset  . Diabetes Mother   . Breast cancer Mother 8       dx late 51's, was in remission but had relapse with mets. Died at 46  . Heart attack Father   . Seizures Father   . Heart disease Father        CAD/MI-fatal  . Breast cancer Sister 52       dx early 28's, BRCA2+, now is 70  . Breast cancer Sister 47       dx. in 51's, BRCA2-,   . Cancer Maternal Grandfather        Lung Cancer  . Breast cancer Maternal Grandfather   . Prostate cancer Maternal Uncle 88       dx and died in his 35's  . Colon cancer  Maternal Aunt 65       dx in 3's, had 12 in of colon removed, died in her 18's  . Alzheimer's disease Paternal Aunt   . Breast cancer Other 35       BRCA2 status unk (did not share result with famiy)  . Kidney cancer Other 35       dx in 03-15-2022, is no in his 62's  . Breast cancer Cousin 50  . Pancreatic cancer Cousin 67       died in 43's  . Lung cancer Maternal Uncle Mar 15, 2068       dx 60's/70's, now is 16  . Stomach cancer Cousin 40       died at 61  . Cancer Cousin 25       dx and died in 2022-03-15. type of cancer unk  . Throat cancer Cousin 73       died in 2's  . Cancer Cousin        dx age unk, died in 41's/60's. type of CA unk  . Cancer Cousin        dx age unk, died in 63's. type of CA unk   . Cancer Cousin        age dx unk, died in 78's    Social History   Tobacco Use  . Smoking status: Never Smoker  . Smokeless tobacco: Never Used  Vaping Use  . Vaping Use: Never used  Substance Use Topics  . Alcohol use: No  . Drug use: No    Home Medications Prior to Admission medications   Medication Sig Start Date End Date Taking? Authorizing Provider  amLODipine (NORVASC) 5 MG tablet Take 1 tablet (5 mg total) by mouth daily. 04/27/19  Yes Hoyt Koch, MD  Cholecalciferol (VITAMIN D3) 1000 UNITS CAPS Take 1 capsule by mouth daily.    Yes [provider]  ezetimibe (ZETIA) 10 MG tablet TAKE 1 TABLET BY MOUTH  DAILY Patient taking differently: Take 10 mg by mouth daily.  07/27/19  Yes Hoyt Koch, MD  fluorometholone (FML) 0.1 % ophthalmic suspension Place 1 drop into both eyes daily.    Yes [provider]  Glucosamine-Chondroit-Vit C-Mn (GLUCOSAMINE 1500 COMPLEX PO) Take 1,500 mg by mouth 2 (two) times daily.   Yes [provider]  hydrochlorothiazide (MICROZIDE) 12.5 MG capsule TAKE 1 CAPSULE BY MOUTH  DAILY Patient taking differently: Take 12.5 mg by mouth daily.  12/01/18  Yes Hoyt Koch, MD  Multiple Vitamins-Minerals (CENTRUM SILVER ADULT 50+ PO) Take 1 tablet by mouth daily.    Yes [provider]  potassium chloride (KLOR-CON) 10 MEQ tablet TAKE 1 TABLET BY MOUTH  DAILY Patient taking differently: Take 10 mEq by mouth daily.  12/01/18  Yes Hoyt Koch, MD  simvastatin (ZOCOR) 20 MG tablet TAKE 1 TABLET BY MOUTH AT  BEDTIME Patient taking differently: Take 20 mg by mouth at bedtime.  07/27/19  Yes Hoyt Koch, MD  potassium chloride (K-DUR) 10 MEQ tablet Take 1 tablet (10 mEq total) by mouth daily. 12/31/11 01/08/13  Norins, Heinz Knuckles, MD    Allergies    Patient has no known allergies.  Review of Systems   Review of Systems  Constitutional: Positive for fatigue. Negative for chills  and fever.  HENT: Negative for congestion, trouble swallowing and voice change.   Eyes: Negative for visual disturbance.  Respiratory: Negative for cough and shortness of breath.   Cardiovascular: Negative for chest pain  and palpitations.  Gastrointestinal: Negative for abdominal pain, nausea and vomiting.  Genitourinary: Negative for enuresis, flank pain and frequency.  Musculoskeletal: Negative for back pain.       Left shoulder pain.  Skin: Negative for rash.  Neurological: Negative for dizziness.  Hematological: Does not bruise/bleed easily.    Physical Exam Updated Vital Signs BP 139/63   Pulse 64   Temp 98.1 F (36.7 C) (Oral)   Resp 11   SpO2 99%   Physical Exam Vitals and nursing note reviewed.  Constitutional:      General: She is not in acute distress.    Appearance: Normal appearance. She is not ill-appearing or diaphoretic.  HENT:     Head: Normocephalic and atraumatic.     Comments: Head was evaluated, no gross abdomen was noted, nontender to palpation, no crepitus felt.    Nose: No congestion or rhinorrhea.     Mouth/Throat:     Mouth: Mucous membranes are moist.     Pharynx: Oropharynx is clear.  Eyes:     General: No visual field deficit or scleral icterus.    Conjunctiva/sclera: Conjunctivae normal.     Pupils: Pupils are equal, round, and reactive to light.  Cardiovascular:     Rate and Rhythm: Normal rate and regular rhythm.     Pulses: Normal pulses.     Heart sounds: No murmur heard.  No friction rub. No gallop.   Pulmonary:     Effort: Pulmonary effort is normal. No respiratory distress.     Breath sounds: No wheezing, rhonchi or rales.  Abdominal:     General: There is no distension.     Palpations: Abdomen is soft.     Tenderness: There is no abdominal tenderness. There is no right CVA tenderness, left CVA tenderness or guarding.  Musculoskeletal:        General: No swelling or tenderness.     Right lower leg: No edema.     Left lower leg:  No edema.     Comments: Patient spine was visualized, no gross abnormalities noted, nontender to palpation, no step-off noted.  Left shoulder was visualized, no gross abnormalities noted, patient had full range of motion, 5 out of 5 strength of the shoulder elbow and wrist, there is slight tenderness palpation along her deltoid, neurovascular fully intact.    Skin:    General: Skin is warm and dry.     Capillary Refill: Capillary refill takes less than 2 seconds.     Findings: No rash.  Neurological:     General: No focal deficit present.     Mental Status: She is alert and oriented to person, place, and time.     GCS: GCS eye subscore is 4. GCS verbal subscore is 5. GCS motor subscore is 6.     Cranial Nerves: Cranial nerves are intact. No cranial nerve deficit or facial asymmetry.     Sensory: Sensation is intact. No sensory deficit.     Motor: Motor function is intact. No weakness or pronator drift.     Coordination: Coordination is intact. Romberg sign negative. Finger-Nose-Finger Test and Heel to [facility] Test normal.  Psychiatric:        Mood and Affect: Mood normal.     ED Results / Procedures / Treatments   Labs (all labs ordered are listed, but only abnormal results are displayed) Labs Reviewed  COMPREHENSIVE METABOLIC PANEL - Abnormal; Notable for the following components:      Result Value  Potassium 3.3 (*)    Glucose, Bld 154 (*)    All other components within normal limits  CBC WITH DIFFERENTIAL/PLATELET  URINALYSIS, ROUTINE W REFLEX MICROSCOPIC    EKG EKG Interpretation  Date/Time:  Tuesday September 28 2019 10:21:02 EDT Ventricular Rate:  62 PR Interval:    QRS Duration: 94 QT Interval:  413 QTC Calculation: 420 R Axis:   26 Text Interpretation: Sinus rhythm No significant change since last tracing Confirmed by Deno Etienne [id number]) on 09/28/2019 10:25:20 AM   Radiology CT Head Wo Contrast  Result Date: 09/28/2019 CLINICAL DATA:  [place] 4 days ago.  Trauma  to the head. EXAM: CT HEAD WITHOUT CONTRAST TECHNIQUE: Contiguous axial images were obtained from the base of the skull through the vertex without intravenous contrast. COMPARISON:  None. FINDINGS: Brain: Mild age related volume loss. No evidence of old or acute focal infarction. No mass lesion, hemorrhage, hydrocephalus or extra-axial collection. Vascular: There is atherosclerotic calcification of the major vessels at the base of the brain. Skull: Negative Sinuses/Orbits: Clear/normal Other: None IMPRESSION: No acute or traumatic finding. Mild age related volume loss. Atherosclerotic calcification of the major vessels at the base of the brain. Electronically Signed   By: Nelson Chimes M.D.   On: 09/28/2019 11:51   DG Shoulder Left  Result Date: 09/28/2019 CLINICAL DATA:  Fall, left shoulder pain EXAM: LEFT SHOULDER - 2+ VIEW COMPARISON:  None. FINDINGS: Degenerative changes in the [facility] joint with joint space narrowing and spurring. Glenohumeral joint is maintained. No acute bony abnormality. Specifically, no fracture, subluxation, or dislocation. Soft tissues are intact. IMPRESSION: Degenerative changes in the left AC joint. No acute bony abnormality. Electronically Signed   By: Rolm Baptise M.D.   On: 09/28/2019 10:44    Procedures Procedures (including critical care time)  Medications Ordered in ED Medications - No data to display  ED Course  I have reviewed the triage vital signs and the nursing notes.  Pertinent labs & imaging results that were available during my care of the patient were reviewed by me and considered in my medical decision making (see chart for details).    MDM Rules/Calculators/A&P                          I have personally reviewed all imaging, labs and have interpreted them.  Patient presents after having a mechanical fall on Friday.  She was alert and oriented, did not appear in acute distress, vital signs reassuring, on exam patient had slight left shoulder pain  tenderness to palpation, full range of motion, neurovascular intact in upper and lower extremities, neuro exam was did not show focal deficits, lungs were clear bilaterally, abdomen soft nontender to palpation, no pedal edema noted.  Will order screening labs and imaging of her head and shoulder for further eval.  CBC does not show leukocytosis or anemia, BMP shows slight hypokalemia of 3.3, slight hyperglycemia of 154, no elevations in liver enzymes, no signs of AKI, no ion gap.  UA negative for nitrates or leukocytes  EKG was sinus rhythm at signs of ischemia, no ST elevation depression noted. Left shoulder x-ray shows some degenerative at the [facility], no acute abnormality noted. CT scan did not show any acute abnormalities.  Low suspicion for cardiac abnormality as patient denies chest pain, shortness of breath, no signs of hypoperfusion or fluid overload on exam, EKG was sinus without signs of ischemia no ST elevation or depression noted.  Low  suspicion for acute abdomen requiring surgical intervention as patient denies pain, abdomen soft nontender to palpation, patient tolerating p.o. without difficulty.  Low suspicion for CVA, intracranial head bleed or cranial fracture as patient denies headache, change in vision, no crepitus felt on exam, neuro exam was benign, CT scan did not show any acute findings. Low suspicion for metabolic abnormality as CBC does not show electrolyte abnormality, no metabolic acidosis, no ion gap noted.  I suspect patient's slight sleepiness is secondary to a minor concussion, recommend over-the-counter pain medications and following up with her PCP for further evaluation management.  Patient appears to be resting comfortably, vital signs reassuring, no indication for hospital admission.  Patient was discussed with attending who agrees assessment plan.  Patient given at home care and strict return precautions.  Patient verbalized that she understood and agreed with said  plan.   Final Clinical Impression(s) / ED Diagnoses Final diagnoses:  Fall, initial encounter    Rx / DC Orders ED Discharge Orders    None       Marcello Fennel, PA-C 09/28/19 [place], DO 09/28/19 1450

## 2019-09-28 NOTE — ED Notes (Signed)
Patient verbalizes understanding of discharge instructions. Opportunity for questioning and answers were provided. Arm band removed by staff, patient discharged from ED. 

## 2019-09-28 NOTE — Discharge Instructions (Addendum)
You have been seen here after a fall.  Lab work and imaging all look reassuring.  I recommend taking over-the-counter pain medication like ibuprofen or Tylenol every 6 as needed for pain.  Please follow dosing the back of bottle.  Possibly  you may have a concussion I recommend giving your brain a mental break meaning limiting your screen time, limit reading, other mentally stimulating activities until you feel back to normal.  I recommend follow-up with your primary care provider for further evaluation management if symptoms persist.  I want to come back to emergency department if you develop fever, chills, chest pain, shortness of breath, severe abdominal pain, uncontrolled nausea, vomiting, diarrhea, numbness or tingling in your upper or lower extremities, loss of balance, severe headache, loss of vision as these symptoms require further evaluation and management.

## 2019-10-12 DIAGNOSIS — Z124 Encounter for screening for malignant neoplasm of cervix: Secondary | ICD-10-CM | POA: Diagnosis not present

## 2019-10-12 DIAGNOSIS — Z6829 Body mass index (BMI) 29.0-29.9, adult: Secondary | ICD-10-CM | POA: Diagnosis not present

## 2019-10-13 ENCOUNTER — Other Ambulatory Visit: Payer: Self-pay | Admitting: Obstetrics and Gynecology

## 2019-10-13 DIAGNOSIS — Z9189 Other specified personal risk factors, not elsewhere classified: Secondary | ICD-10-CM

## 2019-10-26 ENCOUNTER — Encounter: Payer: Medicare PPO | Admitting: Internal Medicine

## 2019-10-27 DIAGNOSIS — E049 Nontoxic goiter, unspecified: Secondary | ICD-10-CM | POA: Diagnosis not present

## 2019-10-27 DIAGNOSIS — E119 Type 2 diabetes mellitus without complications: Secondary | ICD-10-CM | POA: Diagnosis not present

## 2019-10-27 DIAGNOSIS — E785 Hyperlipidemia, unspecified: Secondary | ICD-10-CM | POA: Diagnosis not present

## 2019-10-27 DIAGNOSIS — E041 Nontoxic single thyroid nodule: Secondary | ICD-10-CM | POA: Diagnosis not present

## 2019-10-27 DIAGNOSIS — I10 Essential (primary) hypertension: Secondary | ICD-10-CM | POA: Diagnosis not present

## 2019-10-27 DIAGNOSIS — Z6829 Body mass index (BMI) 29.0-29.9, adult: Secondary | ICD-10-CM | POA: Diagnosis not present

## 2019-10-28 ENCOUNTER — Other Ambulatory Visit: Payer: Self-pay | Admitting: Internal Medicine

## 2019-10-28 DIAGNOSIS — E041 Nontoxic single thyroid nodule: Secondary | ICD-10-CM

## 2019-10-28 DIAGNOSIS — E049 Nontoxic goiter, unspecified: Secondary | ICD-10-CM

## 2019-10-29 ENCOUNTER — Telehealth: Payer: Self-pay | Admitting: Internal Medicine

## 2019-11-01 MED ORDER — SIMVASTATIN 20 MG PO TABS: ORAL_TABLET | ORAL | 0 refills | Status: DC

## 2019-11-01 MED ORDER — EZETIMIBE 10 MG PO TABS: ORAL_TABLET | ORAL | 0 refills | Status: DC

## 2019-11-01 NOTE — Telephone Encounter (Signed)
   Please resend medications to [facility] [phone]

## 2019-11-01 NOTE — Addendum Note (Signed)
Addended by: Darryll Capers on: 11/01/2019 01:37 PM   Modules accepted: Orders

## 2019-11-02 ENCOUNTER — Telehealth: Payer: Self-pay | Admitting: Internal Medicine

## 2019-11-02 MED ORDER — AMLODIPINE BESYLATE 5 MG PO TABS
5.0000 mg | ORAL_TABLET | Freq: Every day | ORAL | 0 refills | Status: DC
Start: 2019-11-02 — End: 2020-01-11

## 2019-11-02 NOTE — Telephone Encounter (Signed)
amLODipine (NORVASC) 5 MG tablet Byrd Hesselbach from [place] calling on behalf of the patient, The prescription will not be to the patient until 7-10 business days so patient is requesting a short term refill sent to [facility] to get her by until she gets the medication in the mail.  [facility] [address] ([address] [place] - [address] Phone:  [phone]  Fax:  [phone]

## 2019-11-03 ENCOUNTER — Other Ambulatory Visit: Payer: Medicare PPO

## 2019-11-03 MED ORDER — EZETIMIBE 10 MG PO TABS: ORAL_TABLET | ORAL | 0 refills | Status: DC

## 2019-11-03 NOTE — Telephone Encounter (Signed)
Rx has been submitted again.  

## 2019-11-03 NOTE — Addendum Note (Signed)
Addended by: Darryll Capers on: 11/03/2019 09:48 AM   Modules accepted: Orders

## 2019-11-03 NOTE — Telephone Encounter (Signed)
    Patient requesting order for Zetia be sent to [facility] again, they do not have order

## 2019-11-05 ENCOUNTER — Ambulatory Visit
Admission: RE | Admit: 2019-11-05 | Discharge: 2019-11-05 | Disposition: A | Discharge status: Home / Self Care | Payer: Medicare PPO | Source: Ambulatory Visit | Attending: Internal Medicine | Admitting: Internal Medicine

## 2019-11-05 DIAGNOSIS — E041 Nontoxic single thyroid nodule: Secondary | ICD-10-CM

## 2019-11-05 DIAGNOSIS — E049 Nontoxic goiter, unspecified: Secondary | ICD-10-CM

## 2019-11-17 ENCOUNTER — Ambulatory Visit
Admission: RE | Admit: 2019-11-17 | Discharge: 2019-11-17 | Disposition: A | Discharge status: Home / Self Care | Payer: Medicare PPO | Source: Ambulatory Visit | Attending: Obstetrics and Gynecology | Admitting: Obstetrics and Gynecology

## 2019-11-17 ENCOUNTER — Other Ambulatory Visit: Payer: Self-pay | Admitting: Internal Medicine

## 2019-11-17 ENCOUNTER — Other Ambulatory Visit: Payer: Self-pay

## 2019-11-17 DIAGNOSIS — N6489 Other specified disorders of breast: Secondary | ICD-10-CM | POA: Diagnosis not present

## 2019-11-17 DIAGNOSIS — Z9189 Other specified personal risk factors, not elsewhere classified: Secondary | ICD-10-CM

## 2019-11-17 DIAGNOSIS — E041 Nontoxic single thyroid nodule: Secondary | ICD-10-CM

## 2019-11-17 MED ORDER — GADOBUTROL 1 MMOL/ML IV SOLN
6.0000 mL | Freq: Once | INTRAVENOUS | Status: AC | PRN
  Administered 2019-11-17: 6 mL via INTRAVENOUS

## 2019-11-30 ENCOUNTER — Encounter: Payer: Self-pay | Admitting: Internal Medicine

## 2019-11-30 ENCOUNTER — Other Ambulatory Visit: Payer: Self-pay

## 2019-11-30 ENCOUNTER — Ambulatory Visit (INDEPENDENT_AMBULATORY_CARE_PROVIDER_SITE_OTHER): Payer: Medicare PPO | Admitting: Internal Medicine

## 2019-11-30 VITALS — BP 132/78 | HR 73 | Temp 98.5°F | Ht 65.0 in | Wt 171.0 lb

## 2019-11-30 DIAGNOSIS — Z0000 Encounter for general adult medical examination without abnormal findings: Secondary | ICD-10-CM

## 2019-11-30 DIAGNOSIS — E1169 Type 2 diabetes mellitus with other specified complication: Secondary | ICD-10-CM

## 2019-11-30 DIAGNOSIS — E118 Type 2 diabetes mellitus with unspecified complications: Secondary | ICD-10-CM

## 2019-11-30 DIAGNOSIS — E785 Hyperlipidemia, unspecified: Secondary | ICD-10-CM

## 2019-11-30 DIAGNOSIS — E2839 Other primary ovarian failure: Secondary | ICD-10-CM

## 2019-11-30 DIAGNOSIS — I10 Essential (primary) hypertension: Secondary | ICD-10-CM | POA: Diagnosis not present

## 2019-11-30 DIAGNOSIS — E119 Type 2 diabetes mellitus without complications: Secondary | ICD-10-CM

## 2019-11-30 LAB — COMPREHENSIVE METABOLIC PANEL
ALT: 23 U/L (ref 0–35)
AST: 24 U/L (ref 0–37)
Albumin: 4.3 g/dL (ref 3.5–5.2)
Alkaline Phosphatase: 119 U/L — ABNORMAL HIGH (ref 39–117)
BUN: 5 mg/dL — ABNORMAL LOW (ref 6–23)
CO2: 32 mEq/L (ref 19–32)
Calcium: 9.7 mg/dL (ref 8.4–10.5)
Chloride: 99 mEq/L (ref 96–112)
Creatinine, Ser: 0.78 mg/dL (ref 0.40–1.20)
GFR: 76.86 mL/min (ref >60.00)
Glucose, Bld: 87 mg/dL (ref 70–99)
Potassium: 3.2 mEq/L — ABNORMAL LOW (ref 3.5–5.1)
Sodium: 138 mEq/L (ref 135–145)
Total Bilirubin: 0.3 mg/dL (ref 0.2–1.2)
Total Protein: 7.9 g/dL (ref 6.0–8.3)

## 2019-11-30 LAB — LIPID PANEL
Cholesterol: 129 mg/dL (ref 0–200)
HDL: 53.6 mg/dL (ref >39.00)
LDL Cholesterol: 54 mg/dL (ref 0–99)
NonHDL: 75.65
Total CHOL/HDL Ratio: 2
Triglycerides: 110 mg/dL (ref 0.0–149.0)
VLDL: 22 mg/dL (ref 0.0–40.0)

## 2019-11-30 LAB — CBC
HCT: 44.7 % (ref 36.0–46.0)
Hemoglobin: 14.5 g/dL (ref 12.0–15.0)
MCHC: 32.5 g/dL (ref 30.0–36.0)
MCV: 84.8 fl (ref 78.0–100.0)
Platelets: 302 10*3/uL (ref 150.0–400.0)
RBC: 5.27 Mil/uL — ABNORMAL HIGH (ref 3.87–5.11)
RDW: 14.2 % (ref 11.5–15.5)
WBC: 6.3 10*3/uL (ref 4.0–10.5)

## 2019-11-30 LAB — MICROALBUMIN / CREATININE URINE RATIO
Creatinine,U: 95.4 mg/dL
Microalb Creat Ratio: 0.7 mg/g (ref 0.0–30.0)
Microalb, Ur: <0.7 mg/dL (ref 0.0–1.9)

## 2019-11-30 LAB — HEMOGLOBIN A1C: Hgb A1c MFr Bld: 7.1 % — ABNORMAL HIGH (ref 4.6–6.5)

## 2019-11-30 MED ORDER — TERBINAFINE HCL 250 MG PO TABS: 250.0000 mg | ORAL_TABLET | Freq: Every day | ORAL | 0 refills | Status: DC

## 2019-11-30 NOTE — Progress Notes (Signed)
Subjective:   Patient ID: Anna Cooper, female    DOB: 02/01/1950, 69 y.o.   MRN: [id number]  HPI The patient is a 69 YO female coming in for physical.   PMH, FMH, social history reviewed and updated  Review of Systems  Constitutional: Negative.   HENT: Negative.   Eyes: Negative.   Respiratory: Negative for cough, chest tightness and shortness of breath.   Cardiovascular: Negative for chest pain, palpitations and leg swelling.  Gastrointestinal: Negative for abdominal distention, abdominal pain, constipation, diarrhea, nausea and vomiting.  Musculoskeletal: Negative.   Skin: Negative.   Neurological: Negative.   Psychiatric/Behavioral: Negative.     Objective:  Physical Exam Constitutional:      Appearance: She is well-developed.  HENT:     Head: Normocephalic and atraumatic.  Cardiovascular:     Rate and Rhythm: Normal rate and regular rhythm.  Pulmonary:     Effort: Pulmonary effort is normal. No respiratory distress.     Breath sounds: Normal breath sounds. No wheezing or rales.  Abdominal:     General: Bowel sounds are normal. There is no distension.     Palpations: Abdomen is soft.     Tenderness: There is no abdominal tenderness. There is no rebound.  Musculoskeletal:     Cervical back: Normal range of motion.  Skin:    General: Skin is warm and dry.  Neurological:     Mental Status: She is alert and oriented to person, place, and time.     Coordination: Coordination normal.     Vitals:   11/30/19 1356  BP: 132/78  Pulse: 73  Temp: 98.5 F (36.9 C)  TempSrc: Oral  SpO2: 98%  Weight: 171 lb (77.6 kg)  Height: 5\' 5"  (1.651 m)    This visit occurred during the SARS-CoV-2 public health emergency.  Safety protocols were in place, including screening questions prior to the visit, additional usage of staff PPE, and extensive cleaning of exam room while observing appropriate contact time as indicated for disinfecting solutions.   Assessment & Plan:

## 2019-11-30 NOTE — Patient Instructions (Addendum)
We have sent in the lamisil to take 1 pill daily for 1 week, then 3 weeks off, then 1 pill daily for 1 week, then 3 weeks off, then 1 pill daily for 1 week. Give this anther 3-6 months to see if successful for the toes.   We will order the scan of the heart to check for blockages.   Health Maintenance, Female Adopting a healthy lifestyle and getting preventive care are important in promoting health and wellness. Ask your health care provider about:  The right schedule for you to have regular tests and exams.  Things you can do on your own to prevent diseases and keep yourself healthy. What should I know about diet, weight, and exercise? Eat a healthy diet   Eat a diet that includes plenty of vegetables, fruits, low-fat dairy products, and lean protein.  Do not eat a lot of foods that are high in solid fats, added sugars, or sodium. Maintain a healthy weight Body mass index (BMI) is used to identify weight problems. It estimates body fat based on height and weight. Your health care provider can help determine your BMI and help you achieve or maintain a healthy weight. Get regular exercise Get regular exercise. This is one of the most important things you can do for your health. Most adults should:  Exercise for at least 150 minutes each week. The exercise should increase your heart rate and make you sweat (moderate-intensity exercise).  Do strengthening exercises at least twice a week. This is in addition to the moderate-intensity exercise.  Spend less time sitting. Even light physical activity can be beneficial. Watch cholesterol and blood lipids Have your blood tested for lipids and cholesterol at 70 years of age, then have this test every 5 years. Have your cholesterol levels checked more often if:  Your lipid or cholesterol levels are high.  You are older than 70 years of age.  You are at high risk for heart disease. What should I know about cancer screening? Depending on your  health history and family history, you may need to have cancer screening at various ages. This may include screening for:  Breast cancer.  Cervical cancer.  Colorectal cancer.  Skin cancer.  Lung cancer. What should I know about heart disease, diabetes, and high blood pressure? Blood pressure and heart disease  High blood pressure causes heart disease and increases the risk of stroke. This is more likely to develop in people who have high blood pressure readings, are of African descent, or are overweight.  Have your blood pressure checked: ? Every 3-5 years if you are 7-15 years of age. ? Every year if you are 58 years old or older. Diabetes Have regular diabetes screenings. This checks your fasting blood sugar level. Have the screening done:  Once every three years after age 86 if you are at a normal weight and have a low risk for diabetes.  More often and at a younger age if you are overweight or have a high risk for diabetes. What should I know about preventing infection? Hepatitis B If you have a higher risk for hepatitis B, you should be screened for this virus. Talk with your health care provider to find out if you are at risk for hepatitis B infection. Hepatitis C Testing is recommended for:  Everyone born from [birth year] through 1966.  Anyone with known risk factors for hepatitis C. Sexually transmitted infections (STIs)  Get screened for STIs, including gonorrhea and chlamydia, if: ? You  are sexually active and are younger than 70 years of age. ? You are older than 70 years of age and your health care provider tells you that you are at risk for this type of infection. ? Your sexual activity has changed since you were last screened, and you are at increased risk for chlamydia or gonorrhea. Ask your health care provider if you are at risk.  Ask your health care provider about whether you are at high risk for HIV. Your health care provider may recommend a prescription  medicine to help prevent HIV infection. If you choose to take medicine to prevent HIV, you should first get tested for HIV. You should then be tested every 3 months for as long as you are taking the medicine. Pregnancy  If you are about to stop having your period (premenopausal) and you may become pregnant, seek counseling before you get pregnant.  Take 400 to 800 micrograms (mcg) of folic acid every day if you become pregnant.  Ask for birth control (contraception) if you want to prevent pregnancy. Osteoporosis and menopause Osteoporosis is a disease in which the bones lose minerals and strength with aging. This can result in bone fractures. If you are 55 years old or older, or if you are at risk for osteoporosis and fractures, ask your health care provider if you should:  Be screened for bone loss.  Take a calcium or vitamin D supplement to lower your risk of fractures.  Be given hormone replacement therapy (HRT) to treat symptoms of menopause. Follow these instructions at home: Lifestyle  Do not use any products that contain nicotine or tobacco, such as cigarettes, e-cigarettes, and chewing tobacco. If you need help quitting, ask your health care provider.  Do not use street drugs.  Do not share needles.  Ask your health care provider for help if you need support or information about quitting drugs. Alcohol use  Do not drink alcohol if: ? Your health care provider tells you not to drink. ? You are pregnant, may be pregnant, or are planning to become pregnant.  If you drink alcohol: ? Limit how much you use to 0-1 drink a day. ? Limit intake if you are breastfeeding.  Be aware of how much alcohol is in your drink. In the [place] one drink equals one 12 oz bottle of beer (355 mL), one 5 oz glass of wine (148 mL), or one 1 oz glass of hard liquor (44 mL). General instructions  Schedule regular health, dental, and eye exams.  Stay current with your vaccines.  Tell your health  care provider if: ? You often feel depressed. ? You have ever been abused or do not feel safe at home. Summary  Adopting a healthy lifestyle and getting preventive care are important in promoting health and wellness.  Follow your health care provider's instructions about healthy diet, exercising, and getting tested or screened for diseases.  Follow your health care provider's instructions on monitoring your cholesterol and blood pressure. This information is not intended to replace advice given to you by your health care provider. Make sure you discuss any questions you have with your health care provider. Document Revised: 12/24/2017 Document Reviewed: 12/24/2017 Elsevier Patient Education  2020 [employer].

## 2019-12-03 NOTE — Assessment & Plan Note (Signed)
Flu shot up to date, covid-19 2 shots recommended booster. Pneumonia complete. Shingrix counseled. Tetanus due 2022. Colonoscopy due 2024. Mammogram due 2023, pap smear aged out and dexa ordered. Counseled about sun safety and mole surveillance. Counseled about the dangers of distracted driving. Given 10 year screening recommendations.

## 2019-12-03 NOTE — Assessment & Plan Note (Signed)
Foot exam done, diet controlled. Checking HgA1c, lipid panel and microalbumin to creatinine ratio. Adjust as needed. On statin.

## 2019-12-03 NOTE — Assessment & Plan Note (Signed)
BP at goal on amlodipine, checking CMP and adjust as needed. 

## 2019-12-03 NOTE — Assessment & Plan Note (Signed)
Checking lipid panel and adjust zetia and simvastatin as needed.

## 2020-01-10 ENCOUNTER — Other Ambulatory Visit: Payer: Self-pay | Admitting: Internal Medicine

## 2020-01-10 ENCOUNTER — Other Ambulatory Visit: Payer: Self-pay

## 2020-01-10 DIAGNOSIS — E2839 Other primary ovarian failure: Secondary | ICD-10-CM

## 2020-01-10 DIAGNOSIS — Z1231 Encounter for screening mammogram for malignant neoplasm of breast: Secondary | ICD-10-CM

## 2020-01-10 MED ORDER — SIMVASTATIN 20 MG PO TABS: 20.0000 mg | ORAL_TABLET | Freq: Every day | ORAL | 3 refills | Status: DC

## 2020-01-10 MED ORDER — EZETIMIBE 10 MG PO TABS
10.0000 mg | ORAL_TABLET | Freq: Every day | ORAL | 3 refills | Status: DC
Start: 2020-01-10 — End: 2021-01-25

## 2020-01-10 NOTE — Progress Notes (Signed)
[facility] request received to have these meds sent to [facility] vs [facility].

## 2020-01-11 ENCOUNTER — Other Ambulatory Visit: Payer: Self-pay

## 2020-01-11 MED ORDER — HYDROCHLOROTHIAZIDE 12.5 MG PO CAPS: 12.5000 mg | ORAL_CAPSULE | Freq: Every day | ORAL | 3 refills | Status: DC

## 2020-01-11 MED ORDER — POTASSIUM CHLORIDE CRYS ER 10 MEQ PO TBCR
10.0000 meq | EXTENDED_RELEASE_TABLET | Freq: Every day | ORAL | 3 refills | Status: DC
Start: 2020-01-11 — End: 2021-01-25

## 2020-01-11 MED ORDER — AMLODIPINE BESYLATE 5 MG PO TABS
5.0000 mg | ORAL_TABLET | Freq: Every day | ORAL | 3 refills | Status: DC
Start: 2020-01-11 — End: 2021-01-25

## 2020-01-11 NOTE — Progress Notes (Signed)
Received fax from [facility] to send prescriptions to pt current insurance mail order.

## 2020-01-17 ENCOUNTER — Telehealth: Payer: Self-pay | Admitting: Internal Medicine

## 2020-01-17 NOTE — Telephone Encounter (Signed)
simvastatin (ZOCOR) 20 MG tablet [facility] - [place], [place] - [address] Phone:  [phone]  Fax:  [phone]     [employer] called patient and stated they needed a new prescription for this medication.  Fax # [phone]

## 2020-01-18 MED ORDER — SIMVASTATIN 20 MG PO TABS
20.0000 mg | ORAL_TABLET | Freq: Every day | ORAL | 3 refills | Status: DC
Start: 2020-01-18 — End: 2020-01-27

## 2020-01-18 NOTE — Telephone Encounter (Signed)
New Rx sent. See meds.  

## 2020-01-26 NOTE — Telephone Encounter (Signed)
Per [facility], clarification needed on instructions on when patient should take simvastatin 20mg  as the directions are conflicting.  Should this be "take 1 tablet at bedtime" ro "take 1 tablet at 6pm.

## 2020-01-27 MED ORDER — SIMVASTATIN 20 MG PO TABS
20.0000 mg | ORAL_TABLET | Freq: Every day | ORAL | 3 refills | Status: DC
Start: 2020-01-27 — End: 2021-01-31

## 2020-01-27 NOTE — Addendum Note (Signed)
Addended by: Claudette Laws D on: 01/27/2020 09:14 AM   Modules accepted: Orders

## 2020-01-27 NOTE — Telephone Encounter (Signed)
It doesn't matter. Just 1 pill daily is fine

## 2020-02-16 ENCOUNTER — Other Ambulatory Visit: Payer: Self-pay

## 2020-02-16 ENCOUNTER — Ambulatory Visit
Admission: RE | Admit: 2020-02-16 | Discharge: 2020-02-16 | Disposition: A | Discharge status: Home / Self Care | Payer: Medicare PPO | Source: Ambulatory Visit | Attending: Internal Medicine | Admitting: Internal Medicine

## 2020-02-16 DIAGNOSIS — Z1231 Encounter for screening mammogram for malignant neoplasm of breast: Secondary | ICD-10-CM

## 2020-02-18 ENCOUNTER — Telehealth: Payer: Self-pay

## 2020-02-18 ENCOUNTER — Other Ambulatory Visit: Payer: Self-pay

## 2020-02-18 ENCOUNTER — Ambulatory Visit (INDEPENDENT_AMBULATORY_CARE_PROVIDER_SITE_OTHER): Payer: Medicare PPO

## 2020-02-18 VITALS — BP 122/80 | HR 84 | Temp 97.9°F | Ht 65.0 in | Wt 171.0 lb

## 2020-02-18 DIAGNOSIS — Z0000 Encounter for general adult medical examination without abnormal findings: Secondary | ICD-10-CM

## 2020-02-18 NOTE — Progress Notes (Signed)
Subjective:   Anna Cooper is a 70 y.o. female who presents for Medicare Annual (Subsequent) preventive examination.  Review of Systems    No ROS. Medicare Wellness Visit. Additional risk factors are reflected in social history. Cardiac Risk Factors include: diabetes mellitus;dyslipidemia;hypertension;family history of premature cardiovascular disease     Objective:    Today's Vitals   02/18/20 0841  BP: 122/80  Pulse: 84  Temp: 97.9 F (36.6 C)  SpO2: 98%  Weight: 171 lb (77.6 kg)  Height: 5' 5"  (1.651 m)  PainSc: 0-No pain   Body mass index is 28.46 kg/m.  Advanced Directives 02/18/2020 07/01/2017 10/11/2016 07/01/2016 06/24/2016 08/29/2015 11/15/2014  Does Patient Have a Medical Advance Directive? No Yes Yes Yes Yes Yes Yes  Type of Advance Directive - [place];Living will [place];Living will [place];Living will [place];Living will - -  Does patient want to make changes to medical advance directive? - - - - No - Patient declined - -  Copy of [place] in Chart? - No - copy requested No - copy requested - No - copy requested No - copy requested No - copy requested  Would patient like information on creating a medical advance directive? No - Patient declined - - - - - -    Current Medications (verified) Outpatient Encounter Medications as of 02/18/2020  Medication Sig  . amLODipine (NORVASC) 5 MG tablet Take 1 tablet (5 mg total) by mouth daily.  . Cholecalciferol (VITAMIN D3) 1000 UNITS CAPS Take 1 capsule by mouth daily.   Marland Kitchen ezetimibe (ZETIA) 10 MG tablet Take 1 tablet (10 mg total) by mouth daily. TAKE 1 TABLET EVERY DAY  . fluorometholone (FML) 0.1 % ophthalmic suspension Place 1 drop into both eyes daily.   . Glucosamine-Chondroit-Vit C-Mn (GLUCOSAMINE 1500 COMPLEX PO) Take 1,500 mg by mouth 2 (two) times daily.  . hydrochlorothiazide (MICROZIDE) 12.5 MG capsule Take 1  capsule (12.5 mg total) by mouth daily.  . Multiple Vitamins-Minerals (CENTRUM SILVER ADULT 50+ PO) Take 1 tablet by mouth daily.   . potassium chloride (KLOR-CON) 10 MEQ tablet Take 1 tablet (10 mEq total) by mouth daily.  . simvastatin (ZOCOR) 20 MG tablet Take 1 tablet (20 mg total) by mouth daily.  Marland Kitchen terbinafine (LAMISIL) 250 MG tablet Take 1 tablet (250 mg total) by mouth daily.  . [DISCONTINUED] potassium chloride (K-DUR) 10 MEQ tablet Take 1 tablet (10 mEq total) by mouth daily.   No facility-administered encounter medications on file as of 02/18/2020.    Allergies (verified) Patient has no known allergies.   History: Past Medical History:  Diagnosis Date  . Fuchs' corneal dystrophy    s/p  bilateral corneal transplant at [facility] -- right 09-24-2011 and left 02-11-2012  . History of recurrent UTIs   . History of venereal warts   . Hyperlipidemia   . Hypertension   . Infantile paralysis   . Major depression   . PMB (postmenopausal bleeding)   . S/P removal of thyroid nodule 01/14/2017  . Type 2 diabetes mellitus ([place]) 03/31/2016   not on meds currently   Past Surgical History:  Procedure Laterality Date  . BREAST BIOPSY Right   . BUNIONECTOMY WITH HAMMERTOE RECONSTRUCTION Bilateral right 07/ 2013;  left 11/ 2013  . CATARACT EXTRACTION W/ INTRAOCULAR LENS  IMPLANT, BILATERAL  right 09-24-2011;  left 02-11-2012   [facility]   and both eye had corneal transplant DSAEK at  same time cataract extraction  . COLONOSCOPY    . DILATATION & CURETTAGE/HYSTEROSCOPY WITH MYOSURE N/A 06/24/2016   Procedure: DILATATION & CURETTAGE/HYSTEROSCOPY WITH MYOSURE;  Surgeon: Tyson Dense, MD;  Location: [facility];  Service: Gynecology;  Laterality: N/A;  . TUBAL LIGATION  1980's   Family History  Problem Relation Age of Onset  . Diabetes Mother   . Breast cancer Mother 24       dx late 15's, was in remission but had relapse with mets. Died at 51  . Heart attack Father   .  Seizures Father   . Heart disease Father        CAD/MI-fatal  . Breast cancer Sister 49       dx early 58's, BRCA2+, now is 31  . Breast cancer Sister 77       dx. in 60's, BRCA2-,   . Cancer Maternal Grandfather        Lung Cancer  . Breast cancer Maternal Grandfather   . Prostate cancer Maternal Uncle 37       dx and died in his 20's  . Colon cancer Maternal Aunt 70       dx in 15's, had 60 in of colon removed, died in her 13's  . Alzheimer's disease Paternal Aunt   . Breast cancer Other 35       BRCA2 status unk (did not share result with famiy)  . Kidney cancer Other 35       dx in 04/10/2022, is no in his 75's  . Breast cancer Cousin 57  . Pancreatic cancer Cousin 86       died in 57's  . Lung cancer Maternal Uncle 10-Apr-2068       dx 60's/70's, now is 47  . Stomach cancer Cousin 40       died at 56  . Cancer Cousin 25       dx and died in 04-10-22. type of cancer unk  . Throat cancer Cousin 57       died in 84's  . Cancer Cousin        dx age unk, died in 59's/60's. type of CA unk  . Cancer Cousin        dx age unk, died in 62's. type of CA unk  . Cancer Cousin        age dx unk, died in 46's   Social History   Socioeconomic History  . Marital status: Legally Separated    Spouse name: Not on file  . Number of children: 2  . Years of education: 37  . Highest education level: Not on file  Occupational History  . Occupation: educator  Tobacco Use  . Smoking status: Never Smoker  . Smokeless tobacco: Never Used  Vaping Use  . Vaping Use: Never used  Substance and Sexual Activity  . Alcohol use: No  . Drug use: No  . Sexual activity: Not Currently  Other Topics Concern  . Not on file  Social History Narrative   HSG, College Grad, [place], [place]; [place]; [employer]- education. Married '71-seperated-'06.1 son April 10, 2069, 1 daughter-'78; 2 grandchildren. 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 Daughter had a baby Jun 11, 2008, @nd  Aug 12th,'11.  Lives in [place]. Not sexually active   Social Determinants of Health   Financial Resource Strain: Low Risk   . Difficulty of Paying Living Expenses: Not hard at all  Food Insecurity: No Food  Insecurity  . Worried About Charity fundraiser in the Last Year: Never true  . Ran Out of Food in the Last Year: Never true  Transportation Needs: No Transportation Needs  . Lack of Transportation (Medical): No  . Lack of Transportation (Non-Medical): No  Physical Activity: Inactive  . Days of Exercise per Week: 0 days  . Minutes of Exercise per Session: 0 min  Stress: No Stress Concern Present  . Feeling of Stress : Not at all  Social Connections: Moderately Integrated  . Frequency of Communication with Friends and Family: More than three times a week  . Frequency of Social Gatherings with Friends and Family: Once a week  . Attends Religious Services: 1 to 4 times per year  . Active Member of Clubs or Organizations: Yes  . Attends Archivist Meetings: 1 to 4 times per year  . Marital Status: Separated    Tobacco Counseling Counseling given: Not Answered   Clinical Intake:  Pre-visit preparation completed: Yes  Pain : No/denies pain Pain Score: 0-No pain     BMI - recorded: 28.46 Nutritional Status: BMI 25 -29 Overweight Nutritional Risks: None Diabetes: No  How often do you need to have someone help you when you read instructions, pamphlets, or other written materials from your doctor or pharmacy?: 1 - Never What is the last grade level you completed in school?: Patient has two Master Degrees  Diabetic? yes  Interpreter Needed?: No  Information entered by :: Lisette Abu, LPN   Activities of Daily Living In your present state of health, do you have any difficulty performing the following activities: 02/18/2020  Hearing? N  Vision? N  Difficulty concentrating or making decisions? N  Walking or climbing stairs? N  Dressing or bathing? N  Doing errands,  shopping? N  Preparing Food and eating ? N  Using the Toilet? N  In the past six months, have you accidently leaked urine? Y  Comment wears panty liner for protection  Do you have problems with loss of bowel control? N  Managing your Medications? N  Managing your Finances? N  Housekeeping or managing your Housekeeping? N  Some recent data might be hidden    Patient Care Team: Hoyt Koch, MD as PCP - General (Internal Medicine) Newt Minion, MD as Consulting Physician (Orthopedic Surgery) Shamleffer, Melanie Crazier, MD as Consulting Physician (Endocrinology)  Indicate any recent Medical Services you may have received from other than [facility] providers in the past year (date may be approximate).     Assessment:   This is a routine wellness examination for [place].  Hearing/Vision screen No exam data present  Dietary issues and exercise activities discussed: Current Exercise Habits: The patient does not participate in regular exercise at present, Exercise limited by: orthopedic condition(s)  Goals    . Client understands the importance of follow-up with providers by attending scheduled visits     My goal is to join online Silver Sneakers Exercise Program to get more physically active.    . Exercise 150 minutes per week (moderate activity)     [facility]  Class  Silver sneaker  Walks prior to her class     . lose weight     Think about increasing my physical activity and continue to monitor diet.    . Patient Stated     Improve my diabetes by monitoring carbohydrates and sugars, continue to exercise, and use portion control. Enjoy  life, family and stay active in church.      Depression Screen PHQ 2/9 Scores 02/18/2020 10/23/2018 07/01/2017 10/11/2016 10/11/2016 08/29/2015 12/01/2014  PHQ - 2 Score 0 0 0 1 1 0 0  PHQ- 9 Score - - - 3 - - -    Fall Risk Fall Risk  02/18/2020 11/30/2019 10/23/2018 10/23/2018 07/01/2017  Falls in the past year? 1  1 0 0 No  Number falls in past yr: 0 0 - - -  Injury with Fall? 1 1 - - -  Follow up Falls evaluation completed - - - -    FALL RISK PREVENTION PERTAINING TO THE HOME:  Any stairs in or around the home? Yes  If so, are there any without handrails? No  Home free of loose throw rugs in walkways, pet beds, electrical cords, etc? Yes  Adequate lighting in your home to reduce risk of falls? Yes   ASSISTIVE DEVICES UTILIZED TO PREVENT FALLS:  Life alert? No  Use of a cane, walker or w/c? No  Grab bars in the bathroom? No  Shower chair or bench in shower? No  Elevated toilet seat or a handicapped toilet? No   TIMED UP AND GO:  Was the test performed? No .  Length of time to ambulate 10 feet: 0 sec.   Gait steady and fast without use of assistive device  Cognitive Function: Normal cognitive status assessed by direct observation by this Nurse Health Advisor. No abnormalities found.   MMSE - Mini Mental State Exam 08/29/2015  Orientation to time 5  Orientation to Place 5  Registration 3  Attention/ Calculation 5  Recall 3  Language- name 2 objects 2  Language- repeat 1  Language- follow 3 step command 3  Language- read & follow direction 1  Write a sentence 1  Copy design 1  Total score 30        Immunizations Immunization History  Administered Date(s) Administered  . Fluad Quad(high Dose 65+) 10/23/2018  . Influenza Split 11/06/2011  . Influenza, High Dose Seasonal PF 09/28/2015, 10/11/2016, 10/21/2017  . Influenza,inj,Quad PF,6+ Mos 10/28/2012, 11/16/2013  . Influenza-Unspecified 10/15/2014, 11/02/2019  . PFIZER(Purple Top)SARS-COV-2 Vaccination 02/02/2019, 02/22/2019  . Pneumococcal Conjugate-13 01/25/2013  . Pneumococcal Polysaccharide-23 11/06/2011, 10/21/2017  . Tdap 11/11/2011  . Zoster 11/11/2011    TDAP status: Up to date  Flu Vaccine status: Up to date  Pneumococcal vaccine status: Up to date  Covid-19 vaccine status: Completed vaccines  Qualifies  for Shingles Vaccine? Yes   Zostavax completed Yes   Shingrix Completed?: Yes  Screening Tests Health Maintenance  Topic Date Due  . OPHTHALMOLOGY EXAM  04/30/2018  . COVID-19 Vaccine (3 - Booster for Pfizer series) 08/22/2019  . MAMMOGRAM  02/22/2020  . HEMOGLOBIN A1C  05/29/2020  . FOOT EXAM  11/29/2020  . URINE MICROALBUMIN  11/29/2020  . TETANUS/TDAP  11/10/2021  . COLONOSCOPY (Pts 45-16yr Insurance coverage will need to be confirmed)  07/16/2026  . INFLUENZA VACCINE  Completed  . DEXA SCAN  Completed  . Hepatitis C Screening  Completed  . PNA vac Low Risk Adult  Completed    Health Maintenance  Health Maintenance Due  Topic Date Due  . OPHTHALMOLOGY EXAM  04/30/2018  . COVID-19 Vaccine (3 - Booster for Pfizer series) 08/22/2019    Colorectal cancer screening: Type of screening: Colonoscopy. Completed 07/15/2016. Repeat every 10 years  Mammogram status: Completed 02/22/2019. Repeat every year (scheduled for 04/03/2020)  Bone Density status: Completed 09/14/2015. Results reflect:  Bone density results: OSTEOPENIA. Repeat every 2 years.  (scheduled for 04/24/2020)  Lung Cancer Screening: (Low Dose CT Chest recommended if Age 28-80 years, 30 pack-year currently smoking OR have quit w/in 15years.) does not qualify.   Lung Cancer Screening Referral: no  Additional Screening:  Hepatitis C Screening: does qualify; Completed yes  Vision Screening: Recommended annual ophthalmology exams for early detection of glaucoma and other disorders of the eye. Is the patient up to date with their annual eye exam?  Yes  Who is the provider or what is the name of the office in which the patient attends annual eye exams? Lovie Macadamia, MD with [facility] If pt is not established with a provider, would they like to be referred to a provider to establish care? No .   Dental Screening: Recommended annual dental exams for proper oral hygiene  Community Resource Referral / Chronic Care  Management: CRR required this visit?  No   CCM required this visit?  No      Plan:     I have personally reviewed and noted the following in the patient's chart:   . Medical and social history . Use of alcohol, tobacco or illicit drugs  . Current medications and supplements . Functional ability and status . Nutritional status . Physical activity . Advanced directives . List of other physicians . Hospitalizations, surgeries, and ER visits in previous 12 months . Vitals . Screenings to include cognitive, depression, and falls . Referrals and appointments  In addition, I have reviewed and discussed with patient certain preventive protocols, quality metrics, and best practice recommendations. A written personalized care plan for preventive services as well as general preventive health recommendations were provided to patient.     Sheral Flow, LPN   08/22/4832   Nurse Notes: n/a

## 2020-02-18 NOTE — Telephone Encounter (Signed)
-----   Message from Myrlene Broker, MD sent at 02/18/2020  9:46 AM EST ----- No, she has completed 3 months of this and it can take 6-12 months for nail to grow out to see if this has helped.  ----- Message ----- From: Mickeal Needy, LPN Sent: 02/21/348   9:41 AM EST To: Myrlene Broker, MD  Good morning,  I had the pleasure of meeting Anna Cooper for her AWV this morning.  She has a question if she should continue with the rx Lamisil for her great toe fungus.  I did check her feet and the nail color is greenish.  Also, she stated that she started last week with having some lower extremity numbness and tingling.  She just wanted you to be informed.   Percell Miller

## 2020-02-18 NOTE — Patient Instructions (Signed)
Anna Cooper , Thank you for taking time to come for your Medicare Wellness Visit. I appreciate your ongoing commitment to your health goals. Please review the following plan we discussed and let me know if I can assist you in the future.   Screening recommendations/referrals: Colonoscopy: 07/15/2016; due every 10 years Mammogram: 02/22/2019 Bone Density: 09/14/2015; due every 2 years Recommended yearly ophthalmology/optometry visit for glaucoma screening and checkup Recommended yearly dental visit for hygiene and checkup  Vaccinations: Influenza vaccine: 11/02/2019 Pneumococcal vaccine: up to date Tdap vaccine: 11/11/2011; due every 10 years Shingles vaccine: up to date Karin Golden)   Covid-19: up to date  Advanced directives: Advance directive discussed with you today. I have provided a copy for you to complete at home and have notarized. Once this is complete please bring a copy in to our office so we can scan it into your chart.  Conditions/risks identified: Yes; Reviewed health maintenance screenings with patient today and relevant education, vaccines, and/or referrals were provided. Please continue to do your personal lifestyle choices by: daily care of teeth and gums, regular physical activity (goal should be 5 days a week for 30 minutes), eat a healthy diet, avoid tobacco and drug use, limiting any alcohol intake, taking a low-dose aspirin (if not allergic or have been advised by your provider otherwise) and taking vitamins and minerals as recommended by your provider. Continue doing brain stimulating activities (puzzles, reading, adult coloring books, staying active) to keep memory sharp. Continue to eat heart healthy diet (full of fruits, vegetables, whole grains, lean protein, water--limit salt, fat, and sugar intake) and increase physical activity as tolerated.  Next appointment: Please schedule your next Medicare Wellness Visit with your Nurse Health Advisor in 1 year by calling  [phone].   Preventive Care 36 Years and Older, Female Preventive care refers to lifestyle choices and visits with your health care provider that can promote health and wellness. What does preventive care include?  A yearly physical exam. This is also called an annual well check.  Dental exams once or twice a year.  Routine eye exams. Ask your health care provider how often you should have your eyes checked.  Personal lifestyle choices, including:  Daily care of your teeth and gums.  Regular physical activity.  Eating a healthy diet.  Avoiding tobacco and drug use.  Limiting alcohol use.  Practicing safe sex.  Taking low-dose aspirin every day.  Taking vitamin and mineral supplements as recommended by your health care provider. What happens during an annual well check? The services and screenings done by your health care provider during your annual well check will depend on your age, overall health, lifestyle risk factors, and family history of disease. Counseling  Your health care provider may ask you questions about your:  Alcohol use.  Tobacco use.  Drug use.  Emotional well-being.  Home and relationship well-being.  Sexual activity.  Eating habits.  History of falls.  Memory and ability to understand (cognition).  Work and work Astronomer.  Reproductive health. Screening  You may have the following tests or measurements:  Height, weight, and BMI.  Blood pressure.  Lipid and cholesterol levels. These may be checked every 5 years, or more frequently if you are over 60 years old.  Skin check.  Lung cancer screening. You may have this screening every year starting at age 48 if you have a 30-pack-year history of smoking and currently smoke or have quit within the past 15 years.  Fecal occult blood test (  FOBT) of the stool. You may have this test every year starting at age 57.  Flexible sigmoidoscopy or colonoscopy. You may have a  sigmoidoscopy every 5 years or a colonoscopy every 10 years starting at age 20.  Hepatitis C blood test.  Hepatitis B blood test.  Sexually transmitted disease (STD) testing.  Diabetes screening. This is done by checking your blood sugar (glucose) after you have not eaten for a while (fasting). You may have this done every 1-3 years.  Bone density scan. This is done to screen for osteoporosis. You may have this done starting at age 29.  Mammogram. This may be done every 1-2 years. Talk to your health care provider about how often you should have regular mammograms. Talk with your health care provider about your test results, treatment options, and if necessary, the need for more tests. Vaccines  Your health care provider may recommend certain vaccines, such as:  Influenza vaccine. This is recommended every year.  Tetanus, diphtheria, and acellular pertussis (Tdap, Td) vaccine. You may need a Td booster every 10 years.  Zoster vaccine. You may need this after age 29.  Pneumococcal 13-valent conjugate (PCV13) vaccine. One dose is recommended after age 29.  Pneumococcal polysaccharide (PPSV23) vaccine. One dose is recommended after age 12. Talk to your health care provider about which screenings and vaccines you need and how often you need them. This information is not intended to replace advice given to you by your health care provider. Make sure you discuss any questions you have with your health care provider. Document Released: 01/27/2015 Document Revised: 09/20/2015 Document Reviewed: 11/01/2014 Elsevier Interactive Patient Education  2017 [employer].  Fall Prevention in the Home Falls can cause injuries. They can happen to people of all ages. There are many things you can do to make your home safe and to help prevent falls. What can I do on the outside of my home?  Regularly fix the edges of walkways and driveways and fix any cracks.  Remove anything that might make you  trip as you walk through a door, such as a raised step or threshold.  Trim any bushes or trees on the path to your home.  Use bright outdoor lighting.  Clear any walking paths of anything that might make someone trip, such as rocks or tools.  Regularly check to see if handrails are loose or broken. Make sure that both sides of any steps have handrails.  Any raised decks and porches should have guardrails on the edges.  Have any leaves, snow, or ice cleared regularly.  Use sand or salt on walking paths during winter.  Clean up any spills in your garage right away. This includes oil or grease spills. What can I do in the bathroom?  Use night lights.  Install grab bars by the toilet and in the tub and shower. Do not use towel bars as grab bars.  Use non-skid mats or decals in the tub or shower.  If you need to sit down in the shower, use a plastic, non-slip stool.  Keep the floor dry. Clean up any water that spills on the floor as soon as it happens.  Remove soap buildup in the tub or shower regularly.  Attach bath mats securely with double-sided non-slip rug tape.  Do not have throw rugs and other things on the floor that can make you trip. What can I do in the bedroom?  Use night lights.  Make sure that you have a light  by your bed that is easy to reach.  Do not use any sheets or blankets that are too big for your bed. They should not hang down onto the floor.  Have a firm chair that has side arms. You can use this for support while you get dressed.  Do not have throw rugs and other things on the floor that can make you trip. What can I do in the kitchen?  Clean up any spills right away.  Avoid walking on wet floors.  Keep items that you use a lot in easy-to-reach places.  If you need to reach something above you, use a strong step stool that has a grab bar.  Keep electrical cords out of the way.  Do not use floor polish or wax that makes floors slippery. If  you must use wax, use non-skid floor wax.  Do not have throw rugs and other things on the floor that can make you trip. What can I do with my stairs?  Do not leave any items on the stairs.  Make sure that there are handrails on both sides of the stairs and use them. Fix handrails that are broken or loose. Make sure that handrails are as long as the stairways.  Check any carpeting to make sure that it is firmly attached to the stairs. Fix any carpet that is loose or worn.  Avoid having throw rugs at the top or bottom of the stairs. If you do have throw rugs, attach them to the floor with carpet tape.  Make sure that you have a light switch at the top of the stairs and the bottom of the stairs. If you do not have them, ask someone to add them for you. What else can I do to help prevent falls?  Wear shoes that:  Do not have high heels.  Have rubber bottoms.  Are comfortable and fit you well.  Are closed at the toe. Do not wear sandals.  If you use a stepladder:  Make sure that it is fully opened. Do not climb a closed stepladder.  Make sure that both sides of the stepladder are locked into place.  Ask someone to hold it for you, if possible.  Clearly mark and make sure that you can see:  Any grab bars or handrails.  First and last steps.  Where the edge of each step is.  Use tools that help you move around (mobility aids) if they are needed. These include:  Canes.  Walkers.  Scooters.  Crutches.  Turn on the lights when you go into a dark area. Replace any light bulbs as soon as they burn out.  Set up your furniture so you have a clear path. Avoid moving your furniture around.  If any of your floors are uneven, fix them.  If there are any pets around you, be aware of where they are.  Review your medicines with your doctor. Some medicines can make you feel dizzy. This can increase your chance of falling. Ask your doctor what other things that you can do to  help prevent falls. This information is not intended to replace advice given to you by your health care provider. Make sure you discuss any questions you have with your health care provider. Document Released: 10/27/2008 Document Revised: 06/08/2015 Document Reviewed: 02/04/2014 Elsevier Interactive Patient Education  2017 [employer].

## 2020-03-01 DIAGNOSIS — H40023 Open angle with borderline findings, high risk, bilateral: Secondary | ICD-10-CM | POA: Diagnosis not present

## 2020-04-03 ENCOUNTER — Ambulatory Visit
Admission: RE | Admit: 2020-04-03 | Discharge: 2020-04-03 | Disposition: A | Discharge status: Home / Self Care | Payer: Medicare PPO | Source: Ambulatory Visit | Attending: Internal Medicine | Admitting: Internal Medicine

## 2020-04-03 ENCOUNTER — Other Ambulatory Visit: Payer: Self-pay

## 2020-04-03 DIAGNOSIS — Z1231 Encounter for screening mammogram for malignant neoplasm of breast: Secondary | ICD-10-CM | POA: Diagnosis not present

## 2020-04-24 ENCOUNTER — Other Ambulatory Visit: Payer: Medicare PPO

## 2020-04-24 ENCOUNTER — Other Ambulatory Visit: Payer: Self-pay

## 2020-04-24 ENCOUNTER — Ambulatory Visit
Admission: RE | Admit: 2020-04-24 | Discharge: 2020-04-24 | Disposition: A | Discharge status: Home / Self Care | Payer: Medicare PPO | Source: Ambulatory Visit | Attending: Internal Medicine | Admitting: Internal Medicine

## 2020-04-24 DIAGNOSIS — M8588 Other specified disorders of bone density and structure, other site: Secondary | ICD-10-CM | POA: Diagnosis not present

## 2020-04-24 DIAGNOSIS — E2839 Other primary ovarian failure: Secondary | ICD-10-CM

## 2020-04-24 DIAGNOSIS — Z780 Asymptomatic menopausal state: Secondary | ICD-10-CM | POA: Diagnosis not present

## 2020-06-25 DIAGNOSIS — Z1152 Encounter for screening for COVID-19: Secondary | ICD-10-CM | POA: Diagnosis not present

## 2020-10-02 ENCOUNTER — Ambulatory Visit (INDEPENDENT_AMBULATORY_CARE_PROVIDER_SITE_OTHER): Payer: Medicare PPO

## 2020-10-02 ENCOUNTER — Other Ambulatory Visit: Payer: Self-pay

## 2020-10-02 ENCOUNTER — Telehealth: Payer: Self-pay | Admitting: Internal Medicine

## 2020-10-02 DIAGNOSIS — Z111 Encounter for screening for respiratory tuberculosis: Secondary | ICD-10-CM

## 2020-10-02 NOTE — Telephone Encounter (Signed)
Type of form received : Health Exam Certification    Form placed in: Provider mailbox  Additional instructions from the patient: notified by phone call. Patient will need to pick it up at her TB reading.   Things to remember: Front office: If form received in person, remind patient that forms take 7-10 business days CMA should attach charge sheet and put on Supervisor's desk

## 2020-10-04 ENCOUNTER — Other Ambulatory Visit: Payer: Self-pay

## 2020-10-04 ENCOUNTER — Ambulatory Visit: Payer: Medicare PPO

## 2020-10-04 DIAGNOSIS — H40053 Ocular hypertension, bilateral: Secondary | ICD-10-CM | POA: Diagnosis not present

## 2020-10-04 DIAGNOSIS — Z111 Encounter for screening for respiratory tuberculosis: Secondary | ICD-10-CM

## 2020-10-04 LAB — TB SKIN TEST
Induration: 0 mm
TB Skin Test: NEGATIVE

## 2020-10-04 NOTE — Progress Notes (Cosign Needed Addendum)
Paitent seen today for TB reading.  Negative results.

## 2020-10-04 NOTE — Telephone Encounter (Signed)
TB negative.  Form given back to patient as she has not been seen for a visit in a year and is scheduled with GYN next week.

## 2020-10-13 ENCOUNTER — Other Ambulatory Visit: Payer: Self-pay | Admitting: Obstetrics and Gynecology

## 2020-10-13 DIAGNOSIS — Z9189 Other specified personal risk factors, not elsewhere classified: Secondary | ICD-10-CM

## 2020-10-13 DIAGNOSIS — M8580 Other specified disorders of bone density and structure, unspecified site: Secondary | ICD-10-CM | POA: Insufficient documentation

## 2020-10-17 ENCOUNTER — Ambulatory Visit
Admission: RE | Admit: 2020-10-17 | Discharge: 2020-10-17 | Disposition: A | Discharge status: Home / Self Care | Payer: Medicare PPO | Source: Ambulatory Visit | Attending: Internal Medicine | Admitting: Internal Medicine

## 2020-10-17 DIAGNOSIS — E041 Nontoxic single thyroid nodule: Secondary | ICD-10-CM | POA: Diagnosis not present

## 2020-10-26 DIAGNOSIS — E041 Nontoxic single thyroid nodule: Secondary | ICD-10-CM | POA: Diagnosis not present

## 2020-10-26 DIAGNOSIS — E119 Type 2 diabetes mellitus without complications: Secondary | ICD-10-CM | POA: Diagnosis not present

## 2020-10-26 DIAGNOSIS — E785 Hyperlipidemia, unspecified: Secondary | ICD-10-CM | POA: Diagnosis not present

## 2020-10-30 ENCOUNTER — Ambulatory Visit
Admission: RE | Admit: 2020-10-30 | Discharge: 2020-10-30 | Disposition: A | Discharge status: Home / Self Care | Payer: Medicare PPO | Source: Ambulatory Visit | Attending: Obstetrics and Gynecology | Admitting: Obstetrics and Gynecology

## 2020-10-30 DIAGNOSIS — Z1289 Encounter for screening for malignant neoplasm of other sites: Secondary | ICD-10-CM | POA: Diagnosis not present

## 2020-10-30 DIAGNOSIS — Z9189 Other specified personal risk factors, not elsewhere classified: Secondary | ICD-10-CM

## 2020-10-30 MED ORDER — GADOBUTROL 1 MMOL/ML IV SOLN
7.0000 mL | Freq: Once | INTRAVENOUS | Status: AC | PRN
  Administered 2020-10-30: 7 mL via INTRAVENOUS

## 2020-11-02 ENCOUNTER — Other Ambulatory Visit: Payer: Self-pay | Admitting: Obstetrics and Gynecology

## 2020-11-02 DIAGNOSIS — R9389 Abnormal findings on diagnostic imaging of other specified body structures: Secondary | ICD-10-CM

## 2020-11-02 DIAGNOSIS — E049 Nontoxic goiter, unspecified: Secondary | ICD-10-CM | POA: Diagnosis not present

## 2020-11-02 DIAGNOSIS — E785 Hyperlipidemia, unspecified: Secondary | ICD-10-CM | POA: Diagnosis not present

## 2020-11-02 DIAGNOSIS — E041 Nontoxic single thyroid nodule: Secondary | ICD-10-CM | POA: Diagnosis not present

## 2020-11-02 DIAGNOSIS — E119 Type 2 diabetes mellitus without complications: Secondary | ICD-10-CM | POA: Diagnosis not present

## 2020-11-02 DIAGNOSIS — Z6829 Body mass index (BMI) 29.0-29.9, adult: Secondary | ICD-10-CM | POA: Diagnosis not present

## 2020-11-02 DIAGNOSIS — M8580 Other specified disorders of bone density and structure, unspecified site: Secondary | ICD-10-CM | POA: Diagnosis not present

## 2020-11-02 DIAGNOSIS — I10 Essential (primary) hypertension: Secondary | ICD-10-CM | POA: Diagnosis not present

## 2020-11-06 ENCOUNTER — Ambulatory Visit: Payer: Medicare PPO | Admitting: Internal Medicine

## 2020-11-06 ENCOUNTER — Other Ambulatory Visit: Payer: Self-pay

## 2020-11-06 ENCOUNTER — Encounter: Payer: Self-pay | Admitting: Internal Medicine

## 2020-11-06 VITALS — BP 122/84 | HR 82 | Resp 18 | Ht 65.0 in | Wt 174.6 lb

## 2020-11-06 DIAGNOSIS — R748 Abnormal levels of other serum enzymes: Secondary | ICD-10-CM | POA: Diagnosis not present

## 2020-11-06 IMAGING — MR MR BREAST BILAT WO/W CM
7 of 11 series · 30 of 48 positions shown · IV contrast (8ml gadavist)
Comparison: Previous exam(s).

CLINICAL DATA: 68-year-old female presenting for high risk
screening MRI. She has a strong family history of breast cancer
including her mother, maternal grandmother, 2 sisters and a niece.
She has history of a benign left breast biopsy in [REDACTED].

LABS:  Creatinine was obtained on site at [HOSPITAL] at [REDACTED] [HOSPITAL].
Results: Creatinine 0.7 mg/dL and GFR of 102.
EXAM:
BILATERAL BREAST MRI WITH AND WITHOUT CONTRAST
TECHNIQUE: Multiplanar, multisequence MR images of both breasts were obtained
prior to and following the intravenous administration of 8 ml of
Gadavist

[Series 3: fl3d pre-cm no · axial · non-contrast · 0.9mm · 0.89mm/px · z∈[-75,+82]mm · 5 of 176 slices shown]
[im 1/176]
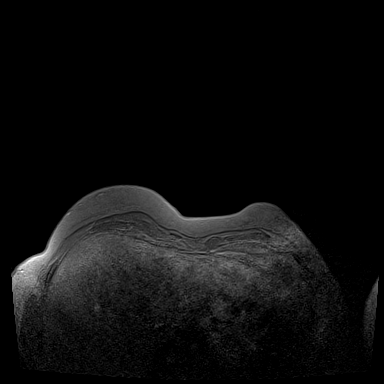
[im 44/176]
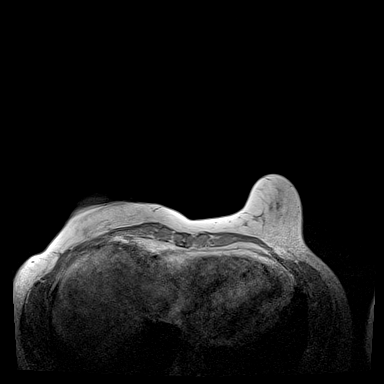
[im 88/176]
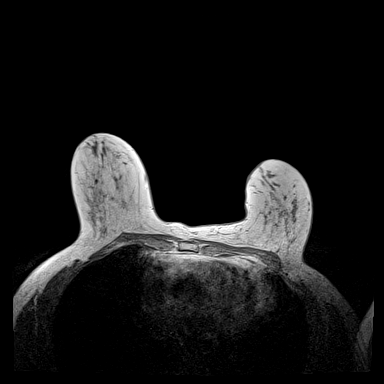
[im 132/176]
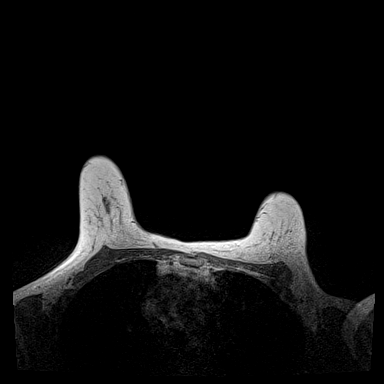
[im 176/176]
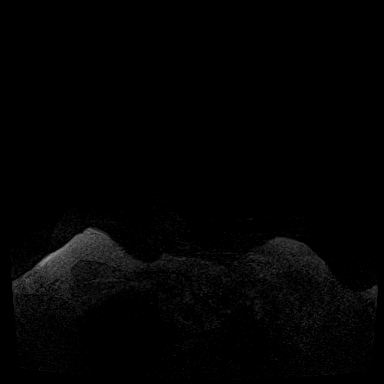

[Series 5: fl3d pre-cm · axial · non-contrast · 0.9mm · 0.82mm/px · z∈[-75,+82]mm · 5 of 176 slices shown]
[im 1/176]
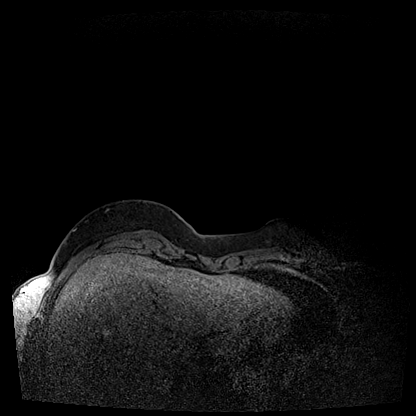
[im 44/176]
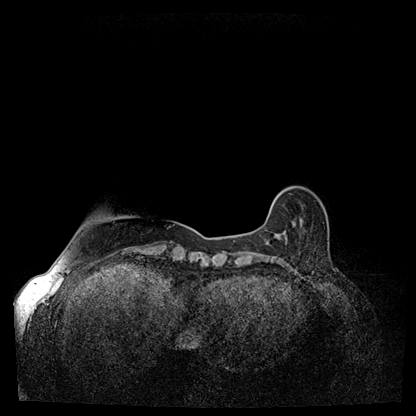
[im 88/176]
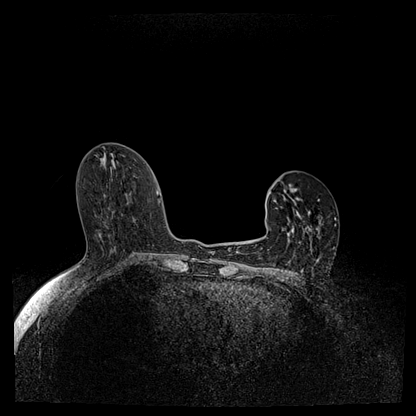
[im 132/176]
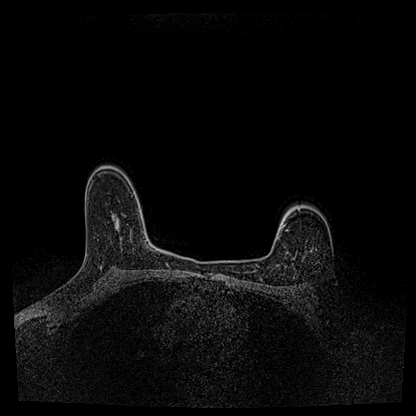
[im 176/176]
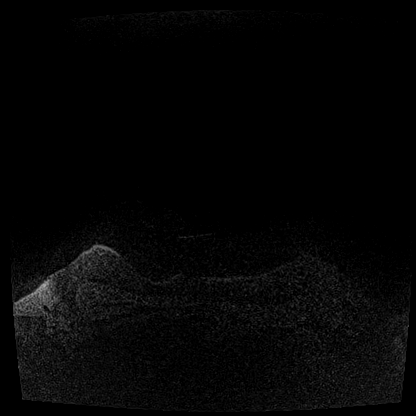

[Series 6: fl3d post-cm 20 · axial · 0.9mm · 0.82mm/px · z∈[-75,+82]mm · 5 of 176 slices shown (1 of 3)]
[im 1/176]
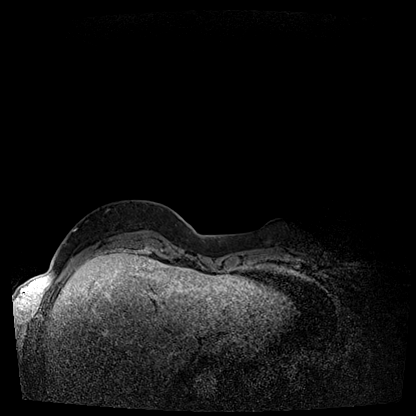
[im 44/176]
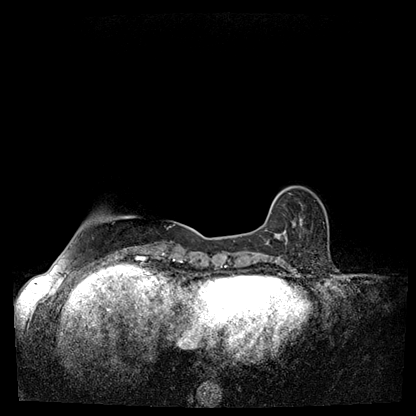
[im 88/176]
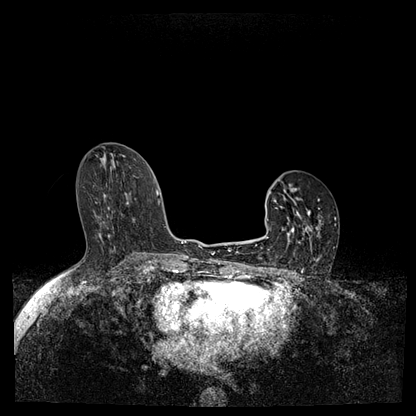
[im 132/176]
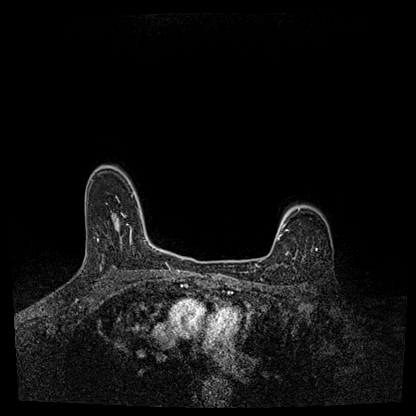
[im 176/176]
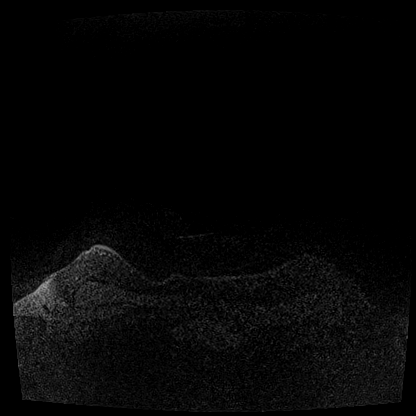

[Series 7: fl3d post-cm 20 · axial · 0.9mm · 0.82mm/px · z∈[-75,+82]mm · 6 of 176 slices shown (2 of 3)]
[im 1/176]
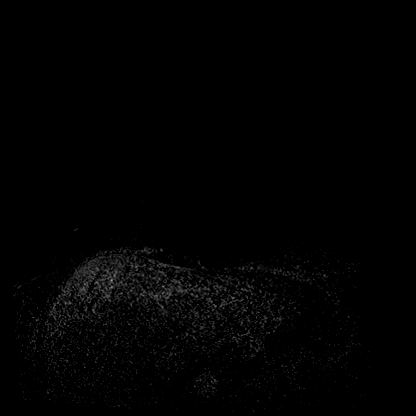
[im 36/176]
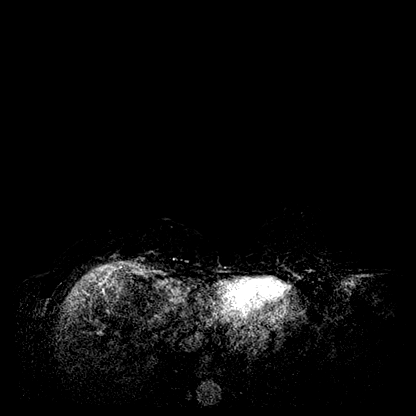
[im 71/176]
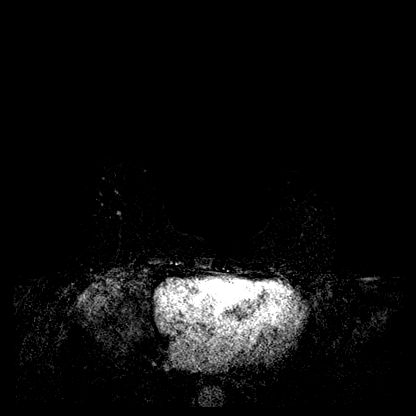
[im 106/176]
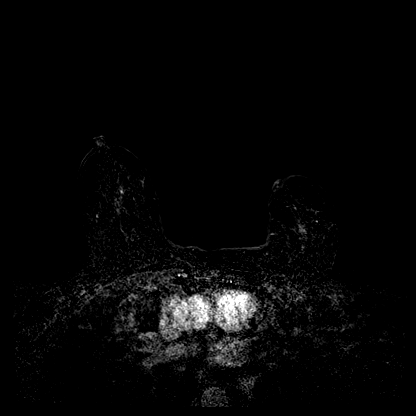
[im 141/176]
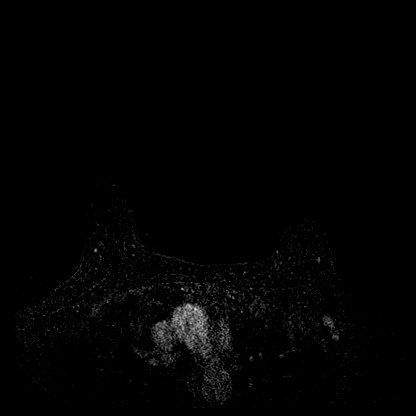
[im 176/176]
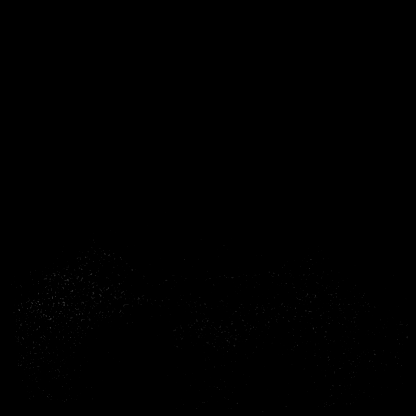

[Series 8: fl3d post-cm 20 · axial · 158.4mm · 0.82mm/px · 1 of 1 slices shown (3 of 3)]
[im 1/1]
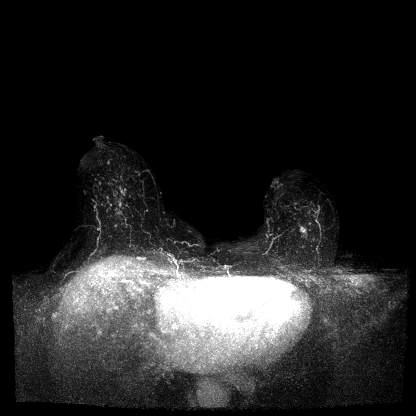

[Series 9: fl3d post-cm 3min · axial · 0.9mm · 0.82mm/px · z∈[-75,+82]mm · 6 of 176 slices shown]
[im 1/176]
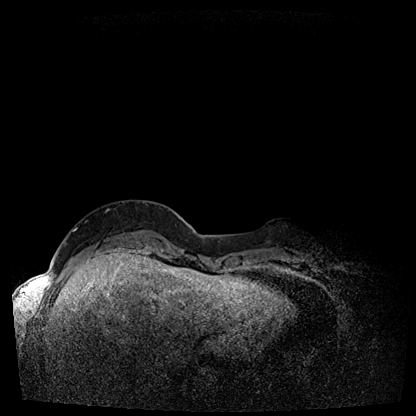
[im 36/176]
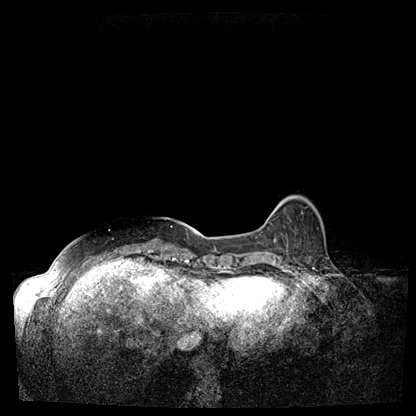
[im 71/176]
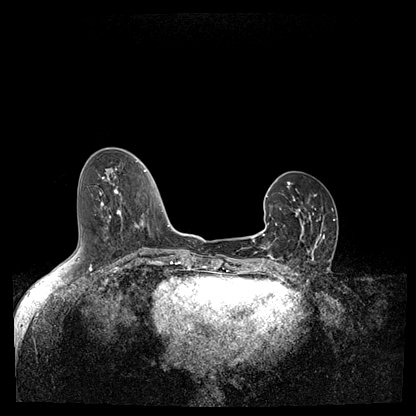
[im 106/176]
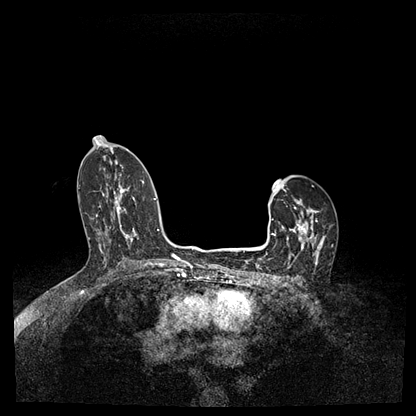
[im 141/176]
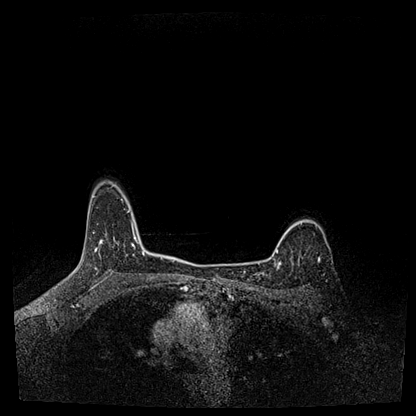
[im 176/176]
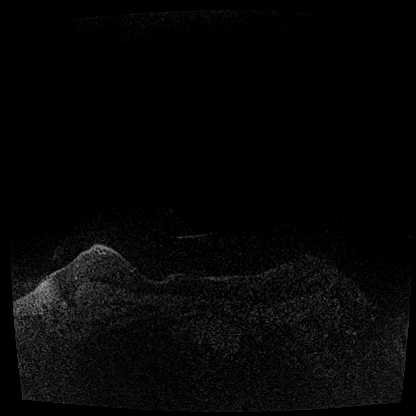

[Series 10: fl3d post-cm 3min_sub · axial · 0.9mm · 0.82mm/px · z∈[-75,-44]mm · 2 of 176 slices shown]
[im 1/176]
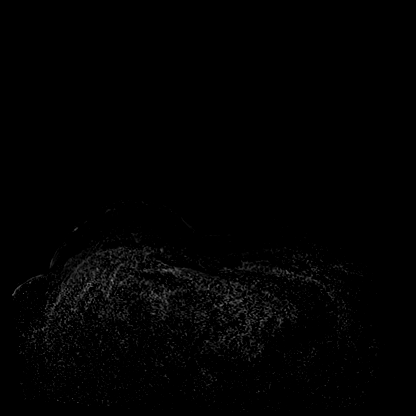
[im 36/176]
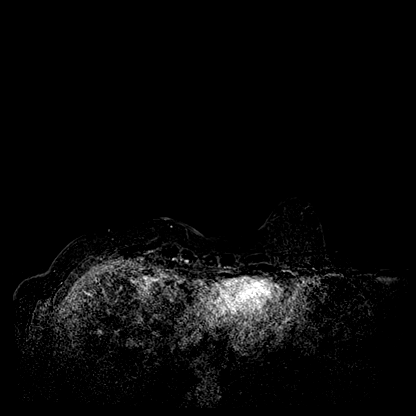

[30 of 48 positions shown; findings below may reference images not displayed]

Three-dimensional MR images were rendered by post-processing of the
original MR data on an independent workstation. The
three-dimensional MR images were interpreted, and findings are
reported in the following complete MRI report for this study. Three
dimensional images were evaluated at the independent [employer]
workstation
FINDINGS: Breast composition: b. Scattered fibroglandular tissue.

Background parenchymal enhancement: Mild.

Right breast: No mass or abnormal enhancement.

Left breast: In the superior posterior left breast, there is a new 6
mm enhancing mass demonstrating both persistent and plateau
kinetics. No other mass or abnormal enhancement is seen in the left
breast.

Lymph nodes: No abnormal appearing lymph nodes.

Ancillary findings:  None.
IMPRESSION: 1. There is a new indeterminate 6 mm enhancing mass in the superior
posterior left breast.

2.   No evidence of right breast malignancy.

RECOMMENDATION:
MRI guided biopsy is recommended for the left breast mass.

BI-RADS CATEGORY  4: Suspicious.

## 2020-11-06 NOTE — Patient Instructions (Signed)
We will check the ultrasound of the liver and gallbladder to check the elevated numbers.

## 2020-11-06 NOTE — Assessment & Plan Note (Signed)
Checking RUQ [place]. Prior alk phos Nov 2021 mild elevation and current reading from study labs 136 which is slightly up from Nov 2021. Intermittent low rib cage pains may or may not be related to gallbladder.

## 2020-11-06 NOTE — Progress Notes (Signed)
Subjective:   Patient ID: Anna Cooper, female    DOB: March 29, 1950, 70 y.o.   MRN: [id number]  HPI The patient is a 70 YO female coming in for review outside labs with elevated alk phos  Review of Systems  Constitutional: Negative.   HENT: Negative.    Eyes: Negative.   Respiratory:  Negative for cough, chest tightness and shortness of breath.   Cardiovascular:  Negative for chest pain, palpitations and leg swelling.  Gastrointestinal:  Negative for abdominal distention, abdominal pain, constipation, diarrhea, nausea and vomiting.  Musculoskeletal: Negative.   Skin: Negative.   Neurological: Negative.   Psychiatric/Behavioral: Negative.     Objective:  Physical Exam Constitutional:      Appearance: She is well-developed.  HENT:     Head: Normocephalic and atraumatic.  Cardiovascular:     Rate and Rhythm: Normal rate and regular rhythm.  Pulmonary:     Effort: Pulmonary effort is normal. No respiratory distress.     Breath sounds: Normal breath sounds. No wheezing or rales.  Abdominal:     General: Bowel sounds are normal. There is no distension.     Palpations: Abdomen is soft.     Tenderness: There is no abdominal tenderness. There is no rebound.  Musculoskeletal:     Cervical back: Normal range of motion.  Skin:    General: Skin is warm and dry.  Neurological:     Mental Status: She is alert and oriented to person, place, and time.     Coordination: Coordination normal.    Vitals:   11/06/20 1514  BP: 122/84  Pulse: 82  Resp: 18  SpO2: 98%  Weight: 174 lb 9.6 oz (79.2 kg)  Height: 5\' 5"  (1.651 m)    This visit occurred during the SARS-CoV-2 public health emergency.  Safety protocols were in place, including screening questions prior to the visit, additional usage of staff PPE, and extensive cleaning of exam room while observing appropriate contact time as indicated for disinfecting solutions.   Assessment & Plan:

## 2020-11-09 ENCOUNTER — Ambulatory Visit
Admission: RE | Admit: 2020-11-09 | Discharge: 2020-11-09 | Disposition: A | Discharge status: Home / Self Care | Payer: Medicare PPO | Source: Ambulatory Visit | Attending: Internal Medicine | Admitting: Internal Medicine

## 2020-11-09 DIAGNOSIS — R748 Abnormal levels of other serum enzymes: Secondary | ICD-10-CM | POA: Diagnosis not present

## 2020-11-10 ENCOUNTER — Other Ambulatory Visit (HOSPITAL_COMMUNITY): Payer: Self-pay | Admitting: Diagnostic Radiology

## 2020-11-10 ENCOUNTER — Other Ambulatory Visit: Payer: Self-pay

## 2020-11-10 ENCOUNTER — Ambulatory Visit
Admission: RE | Admit: 2020-11-10 | Discharge: 2020-11-10 | Disposition: A | Discharge status: Home / Self Care | Payer: Medicare PPO | Source: Ambulatory Visit | Attending: Obstetrics and Gynecology | Admitting: Obstetrics and Gynecology

## 2020-11-10 DIAGNOSIS — R9389 Abnormal findings on diagnostic imaging of other specified body structures: Secondary | ICD-10-CM

## 2020-11-10 DIAGNOSIS — N6011 Diffuse cystic mastopathy of right breast: Secondary | ICD-10-CM | POA: Diagnosis not present

## 2020-11-10 DIAGNOSIS — R928 Other abnormal and inconclusive findings on diagnostic imaging of breast: Secondary | ICD-10-CM | POA: Diagnosis not present

## 2020-11-10 HISTORY — PX: BREAST BIOPSY: SHX20

## 2020-11-10 MED ORDER — GADOBUTROL 1 MMOL/ML IV SOLN
7.0000 mL | Freq: Once | INTRAVENOUS | Status: AC | PRN
  Administered 2020-11-10: 7 mL via INTRAVENOUS

## 2020-11-13 ENCOUNTER — Telehealth: Payer: Self-pay | Admitting: Internal Medicine

## 2020-11-13 NOTE — Telephone Encounter (Signed)
Patient call requesting a call back to discuss mri appt  Please call patient

## 2020-11-14 NOTE — Telephone Encounter (Signed)
Called pt. LVM asking her to give our office a call to discuss. Office number was provided

## 2020-11-25 ENCOUNTER — Encounter: Payer: Self-pay | Admitting: Internal Medicine

## 2020-11-25 DIAGNOSIS — K769 Liver disease, unspecified: Secondary | ICD-10-CM

## 2020-11-28 ENCOUNTER — Other Ambulatory Visit: Payer: Self-pay

## 2020-11-28 ENCOUNTER — Other Ambulatory Visit (INDEPENDENT_AMBULATORY_CARE_PROVIDER_SITE_OTHER): Payer: Medicare PPO

## 2020-11-28 DIAGNOSIS — K769 Liver disease, unspecified: Secondary | ICD-10-CM

## 2020-11-28 LAB — BASIC METABOLIC PANEL
BUN: 9 mg/dL (ref 6–23)
CO2: 29 mEq/L (ref 19–32)
Calcium: 9.9 mg/dL (ref 8.4–10.5)
Chloride: 101 mEq/L (ref 96–112)
Creatinine, Ser: 0.68 mg/dL (ref 0.40–1.20)
GFR: 87.51 mL/min (ref >60.00)
Glucose, Bld: 118 mg/dL — ABNORMAL HIGH (ref 70–99)
Potassium: 3.4 mEq/L — ABNORMAL LOW (ref 3.5–5.1)
Sodium: 138 mEq/L (ref 135–145)

## 2020-12-05 ENCOUNTER — Encounter: Payer: Medicare PPO | Admitting: Internal Medicine

## 2020-12-15 ENCOUNTER — Ambulatory Visit (INDEPENDENT_AMBULATORY_CARE_PROVIDER_SITE_OTHER): Payer: Medicare PPO | Admitting: Internal Medicine

## 2020-12-15 ENCOUNTER — Encounter: Payer: Self-pay | Admitting: Internal Medicine

## 2020-12-15 ENCOUNTER — Other Ambulatory Visit: Payer: Self-pay

## 2020-12-15 VITALS — BP 124/62 | HR 62 | Resp 18 | Ht 65.0 in | Wt 170.8 lb

## 2020-12-15 DIAGNOSIS — Z0000 Encounter for general adult medical examination without abnormal findings: Secondary | ICD-10-CM | POA: Diagnosis not present

## 2020-12-15 DIAGNOSIS — E785 Hyperlipidemia, unspecified: Secondary | ICD-10-CM | POA: Diagnosis not present

## 2020-12-15 DIAGNOSIS — H9193 Unspecified hearing loss, bilateral: Secondary | ICD-10-CM | POA: Insufficient documentation

## 2020-12-15 DIAGNOSIS — H9313 Tinnitus, bilateral: Secondary | ICD-10-CM

## 2020-12-15 DIAGNOSIS — E1169 Type 2 diabetes mellitus with other specified complication: Secondary | ICD-10-CM | POA: Diagnosis not present

## 2020-12-15 DIAGNOSIS — E118 Type 2 diabetes mellitus with unspecified complications: Secondary | ICD-10-CM

## 2020-12-15 DIAGNOSIS — I10 Essential (primary) hypertension: Secondary | ICD-10-CM

## 2020-12-15 NOTE — Assessment & Plan Note (Signed)
Checking lipid panel and adjust simvastatin and zetia as needed. LDL goal <100.

## 2020-12-15 NOTE — Assessment & Plan Note (Signed)
BP at goal on hctz 12.5 mg daily and potassium 10 mEq daily. Checking CMP and adjust as needed.

## 2020-12-15 NOTE — Assessment & Plan Note (Signed)
Foot exam done, eye exam up to date. Diet controlled. Checking HgA1c and microalbumin to creatinine ratio. Adjust as needed. On simvastatin not on ACE-I or ARB.

## 2020-12-15 NOTE — Assessment & Plan Note (Signed)
Referral to audiology. Progressive over years.

## 2020-12-15 NOTE — Progress Notes (Signed)
Subjective:   Patient ID: Anna Cooper, female    DOB: 09-Mar-1950, 70 y.o.   MRN: [id number]  HPI The patient is a 70 YO female coming in for physical.   PMH, FMH, social history reviewed and updated  Review of Systems  Constitutional: Negative.   HENT: Negative.    Eyes: Negative.   Respiratory:  Negative for cough, chest tightness and shortness of breath.   Cardiovascular:  Negative for chest pain, palpitations and leg swelling.  Gastrointestinal:  Negative for abdominal distention, abdominal pain, constipation, diarrhea, nausea and vomiting.  Musculoskeletal: Negative.   Skin: Negative.   Neurological: Negative.   Psychiatric/Behavioral: Negative.     Objective:  Physical Exam Constitutional:      Appearance: She is well-developed.  HENT:     Head: Normocephalic and atraumatic.  Cardiovascular:     Rate and Rhythm: Normal rate and regular rhythm.  Pulmonary:     Effort: Pulmonary effort is normal. No respiratory distress.     Breath sounds: Normal breath sounds. No wheezing or rales.  Abdominal:     General: Bowel sounds are normal. There is no distension.     Palpations: Abdomen is soft.     Tenderness: There is no abdominal tenderness. There is no rebound.  Musculoskeletal:     Cervical back: Normal range of motion.  Skin:    General: Skin is warm and dry.     Comments: Foot exam done  Neurological:     Mental Status: She is alert and oriented to person, place, and time.     Coordination: Coordination normal.    Vitals:   12/15/20 1554  BP: 124/62  Pulse: 62  Resp: 18  SpO2: 98%  Weight: 170 lb 12.8 oz (77.5 kg)  Height: 5\' 5"  (1.651 m)    This visit occurred during the SARS-CoV-2 public health emergency.  Safety protocols were in place, including screening questions prior to the visit, additional usage of staff PPE, and extensive cleaning of exam room while observing appropriate contact time as indicated for disinfecting solutions.   Assessment &  Plan:

## 2020-12-15 NOTE — Patient Instructions (Signed)
We will check the labs and let you know once we get the results of the MRI.

## 2020-12-15 NOTE — Assessment & Plan Note (Signed)
Referral to audiology for testing  

## 2020-12-15 NOTE — Assessment & Plan Note (Signed)
Flu shot up to date. Covid-19 up to date. Pneumonia up to date. Shingrix complete. Tetanus up to date. Colonoscopy up to date. Mammogram up to date, pap smear aged out and dexa up to date. Counseled about sun safety and mole surveillance. Counseled about the dangers of distracted driving. Given 10 year screening recommendations.

## 2020-12-16 LAB — COMPREHENSIVE METABOLIC PANEL
AG Ratio: 1.4 (calc) (ref 1.0–2.5)
ALT: 20 U/L (ref 6–29)
AST: 25 U/L (ref 10–35)
Albumin: 4.1 g/dL (ref 3.6–5.1)
Alkaline phosphatase (APISO): 113 U/L (ref 37–153)
BUN/Creatinine Ratio: 7 (calc) (ref 6–22)
BUN: 5 mg/dL — ABNORMAL LOW (ref 7–25)
CO2: 27 mmol/L (ref 20–32)
Calcium: 9.5 mg/dL (ref 8.6–10.4)
Chloride: 102 mmol/L (ref 98–110)
Creat: 0.69 mg/dL (ref 0.60–1.00)
Globulin: 3 g/dL (calc) (ref 1.9–3.7)
Glucose, Bld: 116 mg/dL — ABNORMAL HIGH (ref 65–99)
Potassium: 3.9 mmol/L (ref 3.5–5.3)
Sodium: 139 mmol/L (ref 135–146)
Total Bilirubin: 0.4 mg/dL (ref 0.2–1.2)
Total Protein: 7.1 g/dL (ref 6.1–8.1)

## 2020-12-16 LAB — HEMOGLOBIN A1C
Hgb A1c MFr Bld: 7.2 % of total Hgb — ABNORMAL HIGH (ref <5.7)
Mean Plasma Glucose: 160 mg/dL
eAG (mmol/L): 8.9 mmol/L

## 2020-12-16 LAB — CBC
HCT: 42 % (ref 35.0–45.0)
Hemoglobin: 13.8 g/dL (ref 11.7–15.5)
MCH: 28.3 pg (ref 27.0–33.0)
MCHC: 32.9 g/dL (ref 32.0–36.0)
MCV: 86.1 fL (ref 80.0–100.0)
MPV: 9.5 fL (ref 7.5–12.5)
Platelets: 339 10*3/uL (ref 140–400)
RBC: 4.88 10*6/uL (ref 3.80–5.10)
RDW: 13 % (ref 11.0–15.0)
WBC: 6 10*3/uL (ref 3.8–10.8)

## 2020-12-16 LAB — VITAMIN D 25 HYDROXY (VIT D DEFICIENCY, FRACTURES): Vit D, 25-Hydroxy: 54 ng/mL (ref 30–100)

## 2020-12-16 LAB — LIPID PANEL
Cholesterol: 134 mg/dL (ref <200)
HDL: 49 mg/dL — ABNORMAL LOW (ref >=50)
LDL Cholesterol (Calc): 66 mg/dL (calc)
Non-HDL Cholesterol (Calc): 85 mg/dL (calc) (ref <130)
Total CHOL/HDL Ratio: 2.7 (calc) (ref <5.0)
Triglycerides: 103 mg/dL (ref <150)

## 2020-12-16 LAB — MICROALBUMIN / CREATININE URINE RATIO
Creatinine, Urine: 165 mg/dL (ref 20–275)
Microalb Creat Ratio: 2 mcg/mg creat (ref <30)
Microalb, Ur: 0.3 mg/dL

## 2020-12-16 LAB — VITAMIN B12: Vitamin B-12: 586 pg/mL (ref 200–1100)

## 2020-12-20 ENCOUNTER — Ambulatory Visit: Payer: Medicare PPO | Attending: Internal Medicine | Admitting: Audiologist

## 2020-12-20 ENCOUNTER — Other Ambulatory Visit: Payer: Self-pay

## 2020-12-20 DIAGNOSIS — H9313 Tinnitus, bilateral: Secondary | ICD-10-CM | POA: Diagnosis not present

## 2020-12-20 DIAGNOSIS — H903 Sensorineural hearing loss, bilateral: Secondary | ICD-10-CM | POA: Diagnosis not present

## 2020-12-20 NOTE — Procedures (Signed)
Outpatient Audiology and [facility] [address] [place], [place]  [address] [phone]  AUDIOLOGICAL  EVALUATION  NAME: Anna Cooper     DOB:   March 14, 1950      MRN: [id number]                                                                                     DATE: 12/20/2020     REFERENT: Anna Broker, MD STATUS: Outpatient DIAGNOSIS: Tinnitus, Bilateral Sensorineural Hearing Loss    History: Anna Cooper was seen for an audiological evaluation.  Anna Cooper is receiving a hearing evaluation due to concerns for tinnitus. Anna Cooper's main concern the cricket sound in her ears. She has been hearing it for many years. She has some difficulty hearing in background noise, crowds, and when people are at a distance. The tinnitus has recently been getting louder. No pain or pressure reported in either ear. Tinnitus present in both ears and is about the same. She is unsure of when exactly it started.  Medical history positive for Type II diabetes which is a risk factor for hearing loss. No other relevant case history reported.    Evaluation:  Otoscopy showed a clear view of the tympanic membranes, bilaterally Tympanometry results were consistent with normal middle ear pressure, bilaterally   Audiometric testing was completed using conventional audiometry with insert and supraural transducer. Speech Recognition Thresholds were  consistent with pure tone averages. Word Recognition was good in both ears at 40dB SL. Pure tone thresholds shows normal hearing sloping to a mild normal sensorineural hearing loss in both ears. 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   Results:  The test results were reviewed with Anna Cooper she has a mild high frequency hearing loss in both ears. This is likely the cause for the cricket sound in both ears. Recommend avoiding sitting in quiet environments by using a masker. The masking sound needs to be a neutral sound. The noise of the TV  or music is not recommended since it keeps the auditory system engaged. Recommend using the masking sound when tinnitus is bothersome. Play the masker at the softest volume that provides relief.   Recommendations: Recommend testing hearing annually to monitor hearing loss progression.  If Anna Cooper has a change in the tinnitus severity, follow up with Dr. Jacinto Cooper at [facility] recommended. Dr. Cammy Cooper specializes in evaluation and therapeutic treatment of severe sound sensitivity and tinnitus.  Use Bose Sleep Buds is recommended to mask tinnitus when needed.  Use of masker should be at night and when in quiet environments where the tinnitus is loud or when around triggering sound. Masker should be played at lowest level possible that provides relief from tinnitus.  These can be purchased on the [employer] site or from [employer] like [employer].    Anna Cooper  Audiologist, Au.D., CCC-A 12/20/2020  4:30 PM  Cc: Anna Broker, MD

## 2020-12-21 ENCOUNTER — Ambulatory Visit
Admission: RE | Admit: 2020-12-21 | Discharge: 2020-12-21 | Disposition: A | Discharge status: Home / Self Care | Payer: Medicare PPO | Source: Ambulatory Visit | Attending: Internal Medicine | Admitting: Internal Medicine

## 2020-12-21 DIAGNOSIS — K769 Liver disease, unspecified: Secondary | ICD-10-CM

## 2020-12-21 DIAGNOSIS — K760 Fatty (change of) liver, not elsewhere classified: Secondary | ICD-10-CM | POA: Diagnosis not present

## 2020-12-21 MED ORDER — GADOBENATE DIMEGLUMINE 529 MG/ML IV SOLN
15.0000 mL | Freq: Once | INTRAVENOUS | Status: AC | PRN
  Administered 2020-12-21: 15 mL via INTRAVENOUS

## 2020-12-28 ENCOUNTER — Other Ambulatory Visit: Payer: Self-pay | Admitting: Internal Medicine

## 2020-12-28 DIAGNOSIS — Z006 Encounter for examination for normal comparison and control in clinical research program: Secondary | ICD-10-CM

## 2021-01-17 ENCOUNTER — Other Ambulatory Visit: Payer: Self-pay

## 2021-01-17 ENCOUNTER — Ambulatory Visit
Admission: RE | Admit: 2021-01-17 | Discharge: 2021-01-17 | Disposition: A | Discharge status: Home / Self Care | Payer: No Typology Code available for payment source | Source: Ambulatory Visit | Attending: Internal Medicine | Admitting: Internal Medicine

## 2021-01-17 DIAGNOSIS — Z006 Encounter for examination for normal comparison and control in clinical research program: Secondary | ICD-10-CM

## 2021-01-24 ENCOUNTER — Other Ambulatory Visit: Payer: Self-pay | Admitting: Internal Medicine

## 2021-01-30 ENCOUNTER — Other Ambulatory Visit: Payer: Self-pay | Admitting: Internal Medicine

## 2021-02-20 ENCOUNTER — Ambulatory Visit (INDEPENDENT_AMBULATORY_CARE_PROVIDER_SITE_OTHER): Payer: Medicare PPO

## 2021-02-20 ENCOUNTER — Other Ambulatory Visit: Payer: Self-pay

## 2021-02-20 DIAGNOSIS — Z0000 Encounter for general adult medical examination without abnormal findings: Secondary | ICD-10-CM

## 2021-02-20 NOTE — Progress Notes (Signed)
I connected with Roel Cluck today by telephone and verified that I am speaking with the correct person using two identifiers. Location patient: home Location provider: work Persons participating in the virtual visit: patient, provider.   I discussed the limitations, risks, security and privacy concerns of performing an evaluation and management service by telephone and the availability of in person appointments. I also discussed with the patient that there may be a patient responsible charge related to this service. The patient expressed understanding and verbally consented to this telephonic visit.    Interactive audio and video telecommunications were attempted between this provider and patient, however failed, due to patient having technical difficulties OR patient did not have access to video capability.  We continued and completed visit with audio only.  Some vital signs may be absent or patient reported.   Time Spent with patient on telephone encounter: 40 minutes  Subjective:   Anna Cooper is a 71 y.o. female who presents for Medicare Annual (Subsequent) preventive examination.  Review of Systems     Cardiac Risk Factors include: advanced age (>88mn, >>75women);diabetes mellitus;dyslipidemia;family history of premature cardiovascular disease;hypertension     Objective:    There were no vitals filed for this visit. There is no height or weight on file to calculate BMI.  Advanced Directives 02/20/2021 02/18/2020 07/01/2017 10/11/2016 07/01/2016 06/24/2016 08/29/2015  Does Patient Have a Medical Advance Directive? Yes No Yes Yes Yes Yes Yes  Type of Advance Directive Living will;Healthcare Power of [place] will [place] West[place] will [place] will [place] will -  Does patient want to make changes to medical advance directive? No - Patient declined - - - - No - Patient  declined -  Copy of [place] Chart? No - copy requested - No - copy requested No - copy requested - No - copy requested No - copy requested  Would patient like information on creating a medical advance directive? - No - Patient declined - - - - -    Current Medications (verified) Outpatient Encounter Medications as of 02/20/2021  Medication Sig   amLODipine (NORVASC) 5 MG tablet TAKE 1 TABLET (5 MG TOTAL) BY MOUTH DAILY.   Cholecalciferol (VITAMIN D3) 1000 UNITS CAPS Take 1 capsule by mouth daily.    ezetimibe (ZETIA) 10 MG tablet TAKE 1 TABLET EVERY DAY   fluorometholone (FML) 0.1 % ophthalmic suspension Place 1 drop into both eyes daily.    Glucosamine-Chondroit-Vit C-Mn (GLUCOSAMINE 1500 COMPLEX PO) Take 1,500 mg by mouth 2 (two) times daily.   hydrochlorothiazide (MICROZIDE) 12.5 MG capsule TAKE 1 CAPSULE (12.5 MG TOTAL) BY MOUTH DAILY.   Multiple Vitamins-Minerals (CENTRUM SILVER ADULT 50+ PO) Take 1 tablet by mouth daily.    potassium chloride (KLOR-CON M) 10 MEQ tablet TAKE 1 TABLET (10 MEQ TOTAL) BY MOUTH DAILY   simvastatin (ZOCOR) 20 MG tablet TAKE 1 TABLET EVERY DAY   [DISCONTINUED] potassium chloride (K-DUR) 10 MEQ tablet Take 1 tablet (10 mEq total) by mouth daily.   No facility-administered encounter medications on file as of 02/20/2021.    Allergies (verified) Patient has no known allergies.   History: Past Medical History:  Diagnosis Date   Fuchs' corneal dystrophy    s/p  [place] and left 02-11-2012   History of recurrent UTIs    History of venereal warts    Hyperlipidemia    Hypertension  Infantile paralysis    Major depression    PMB (postmenopausal bleeding)    S/P removal of thyroid nodule 01/14/2017   Type 2 diabetes mellitus ([place]) 03/31/2016   not on meds currently   Past Surgical History:  Procedure Laterality Date   BREAST BIOPSY Right    BUNIONECTOMY WITH HAMMERTOE  RECONSTRUCTION Bilateral right 07/ 2013;  left 11/ 2013   CATARACT EXTRACTION W/ INTRAOCULAR LENS  IMPLANT, BILATERAL  right 09-24-2011;  left 02-11-2012   [facility]   and 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 Died at 17   Heart attack Father    Seizures Father    Heart disease Father        CAD/MI-fatal   Breast cancer Sister 5       dx early 53's, BRCA2+, now is 80   Breast cancer Sister 26       dx. in 40's, BRCA2-,    Cancer Maternal Grandfather        Lung Cancer   Breast cancer Maternal Grandfather    Prostate cancer Maternal Uncle 38       dx and died in his 61's   Colon cancer Maternal Aunt 53       dx in 73's, had 71 in of colon removed, died in her 34's   Alzheimer's disease Paternal Aunt    Breast cancer Other 44       BRCA2 status unk (did not share result with famiy)   Kidney cancer Other 35       dx in 72's, is no in his 58's   Breast cancer Cousin 56   Pancreatic cancer Cousin 86       died in 29's   Lung cancer Maternal Uncle 56       dx 60's/70's, now is 88   Stomach cancer Cousin 40       died at 89   [place] 29       dx and died in 70's. type of cancer unk   Throat cancer Cousin 38       died in 66's   Cancer Cousin        dx age unk, died in 49's/60's. type of CA unk   Cancer Cousin        dx age unk, died in 18's. type of CA unk   Cancer Cousin        age dx unk, died in 41's   Social History   Socioeconomic History   Marital status: Legally Separated    Spouse name: Not on  file   Number of children: 2   Years of education: 20   Highest education level: Not on file  Occupational History   Occupation: educator  Tobacco Use   Smoking status: Never   Smokeless tobacco: Never  Vaping Use   Vaping Use: Never used  Substance and Sexual Activity   Alcohol use: No   Drug use: No   Sexual activity: Not  Currently  Other Topics Concern   Not on file  Social History Narrative   HSG, College Grad, [place], [place]; [place]; [employer]- education. Married '71-seperated-'06.1 son '71, 1 daughter-'78; 2 grandchildren. 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 Daughter had a baby Jun 11, 2008, @nd  Aug 12th,'11. Lives in [place]. Not sexually active   Social Determinants of Health   Financial Resource Strain: Low Risk    Difficulty of Paying Living Expenses: Not hard at all  Food Insecurity: No Food Insecurity   Worried About Charity fundraiser in the Last Year: Never true   [place] in the Last Year: Never true  Transportation Needs: No Transportation Needs   Lack of Transportation (Medical): No   Lack of Transportation (Non-Medical): No  Physical Activity: Sufficiently Active   Days of Exercise per Week: 5 days   Minutes of Exercise per Session: 30 min  Stress: No Stress Concern Present   Feeling of Stress : Not at all  Social Connections: Moderately Integrated   Frequency of Communication with Friends and Family: More than three times a week   Frequency of Social Gatherings with Friends and Family: Once a week   Attends Religious Services: 1 to 4 times per year   Active Member of [employer] or Organizations: Yes   Attends Archivist Meetings: 1 to 4 times per year   Marital Status: Separated    Tobacco Counseling Counseling given: Not Answered   Clinical Intake:  Pre-visit preparation completed: Yes  Pain : No/denies pain     Nutritional Risks: None Diabetes: Yes CBG done?: No Did pt. bring in  CBG monitor from home?: No  How often do you need to have someone help you when you read instructions, pamphlets, or other written materials from your doctor or pharmacy?: 1 - Never What is the last grade level you completed in school?: HSG; College [employer];  Master's Degrees; Retired Tourist information centre manager  Diabetic? yes  Interpreter Needed?: No  Information entered by :: Lisette Abu, LPN   Activities of Daily Living In your present state of health, do you have any difficulty performing the following activities: 02/20/2021  Hearing? N  Vision? N  Difficulty concentrating or making decisions? N  Walking or climbing stairs? N  Dressing or bathing? N  Doing errands, shopping? N  Preparing Food and eating ? N  Using the Toilet? N  In the past six months, have you accidently leaked urine? Y  Do you have problems with loss of bowel control? N  Managing your Medications? N  Managing your Finances? N  Housekeeping or managing your Housekeeping? N  Some recent data might be hidden    Patient Care Team: Hoyt Koch, MD as PCP - General (Internal Medicine) Newt Minion, MD as Consulting Physician (Orthopedic Surgery) Shamleffer, Melanie Crazier, MD as Consulting Physician (Endocrinology) Tyson Dense, MD as Consulting Physician (Obstetrics and Gynecology) Charletta Cousin, MD as Referring Physician (Ophthalmology)  Indicate any recent Medical Services you may have received from other than [facility] providers in the past year (date may be approximate).     Assessment:   This is a routine wellness examination for Ceana.  Hearing/Vision screen Hearing Screening - Comments:: Patient has minimal hearing loss.  No hearing aids needed at this time.  Vision Screening - Comments:: Patient wears corrective glasses/contacts.  Eye exam done annually by: Charletta Cousin, MD.  Dietary issues and exercise activities  discussed: Current Exercise Habits: Home  exercise routine, Type of exercise: walking, Time (Minutes): 30, Frequency (Times/Week): 5, Weekly Exercise (Minutes/Week): 150, Intensity: Moderate, Exercise limited by: None identified   Goals Addressed               This Visit's Progress     Patient Stated (pt-stated)        My goal is to lose 20 pounds by being more physically active, staying hydrated and get my HgA1C down to normal range.      Depression Screen PHQ 2/9 Scores 02/20/2021 02/18/2020 10/23/2018 07/01/2017 10/11/2016 10/11/2016 08/29/2015  PHQ - 2 Score 0 0 0 0 1 1 0  PHQ- 9 Score - - - - 3 - -    Fall Risk Fall Risk  02/20/2021 02/18/2020 11/30/2019 10/23/2018 10/23/2018  Falls in the past year? 0 1 1 0 0  Number falls in past yr: 0 0 0 - -  Injury with Fall? 0 1 1 - -  Risk for fall due to : No Fall Risks - - - -  Follow up Falls evaluation completed Falls evaluation completed - - -    FALL RISK PREVENTION PERTAINING TO THE HOME:  Any stairs in or around the home? Yes  If so, are there any without handrails? No  Home free of loose throw rugs in walkways, pet beds, electrical cords, etc? Yes  Adequate lighting in your home to reduce risk of falls? Yes   ASSISTIVE DEVICES UTILIZED TO PREVENT FALLS:  Life alert? No  Use of a cane, walker or w/c? No  Grab bars in the bathroom? No  Shower chair or bench in shower? No  Elevated toilet seat or a handicapped toilet? Yes   TIMED UP AND GO:  Was the test performed? No .  Length of time to ambulate 10 feet: n/a sec.   Gait steady and fast without use of assistive device  Cognitive Function: Normal cognitive status assessed by direct observation by this Nurse Health Advisor. No abnormalities found.   MMSE - Mini Mental State Exam 08/29/2015  Orientation to time 5  Orientation to Place 5  Registration 3  Attention/ Calculation 5  Recall 3  Language- name 2 objects 2  Language- repeat 1  Language- follow 3 step command 3  Language- read & follow direction 1  Write  a sentence 1  Copy design 1  Total score 30        Immunizations Immunization History  Administered Date(s) Administered   Fluad Quad(high Dose 65+) 10/23/2018   Influenza Split 11/06/2011   Influenza, High Dose Seasonal PF 09/28/2015, 10/11/2016, 10/21/2017   Influenza,inj,Quad PF,6+ Mos 10/28/2012, 11/16/2013   Influenza-Unspecified 10/15/2014, 11/02/2019, 11/03/2020   PFIZER(Purple Top)SARS-COV-2 Vaccination 02/02/2019, 02/22/2019, 11/30/2019   Pfizer Covid-19 Vaccine Bivalent Booster 54yr & up 11/03/2020   Pneumococcal Conjugate-13 01/25/2013   Pneumococcal Polysaccharide-23 11/06/2011, 10/21/2017   Tdap 11/11/2011   Zoster Recombinat (Shingrix) 10/21/2017, 12/25/2017   Zoster, Live 11/11/2011    TDAP status: Up to date  Flu Vaccine status: Up to date  Pneumococcal vaccine status: Up to date  Covid-19 vaccine status: Completed vaccines  Qualifies for Shingles Vaccine? Yes   Zostavax completed Yes   Shingrix Completed?: Yes  Screening Tests Health Maintenance  Topic Date Due   OPHTHALMOLOGY EXAM  04/30/2018   MAMMOGRAM  04/03/2021   HEMOGLOBIN A1C  06/15/2021   TETANUS/TDAP  11/10/2021   FOOT EXAM  12/15/2021   URINE MICROALBUMIN  12/15/2021   COLONOSCOPY (  Pts 45-63yr Insurance coverage will need to be confirmed)  07/16/2026   Pneumonia Vaccine 72 Years old  Completed   INFLUENZA VACCINE  Completed   DEXA SCAN  Completed   COVID-19 Vaccine  Completed   Hepatitis C Screening  Completed   Zoster Vaccines- Shingrix  Completed   HPV VACCINES  Aged Out    Health Maintenance  Health Maintenance Due  Topic Date Due   OPHTHALMOLOGY EXAM  04/30/2018    Colorectal cancer screening: Type of screening: Colonoscopy. Completed 07/15/2016. Repeat every 10 years  Mammogram status: Completed 04/03/2020. Repeat every year  Bone Density status: Completed 04/24/2020. Results reflect: Bone density results: OSTEOPENIA. Repeat every 2-3 years.  Lung Cancer Screening:  (Low Dose CT Chest recommended if Age 72-80years, 30 pack-year currently smoking OR have quit w/in 15years.) does not qualify.   Lung Cancer Screening Referral: no  Additional Screening:  Hepatitis C Screening: does qualify; Completed yes  Vision Screening: Recommended annual ophthalmology exams for early detection of glaucoma and other disorders of the eye. Is the patient up to date with their annual eye exam?  Yes  Who is the provider or what is the name of the office in which the patient attends annual eye exams? TCharletta Cousin MD. If pt is not established with a provider, would they like to be referred to a provider to establish care? No .   Dental Screening: Recommended annual dental exams for proper oral hygiene  Community Resource Referral / Chronic Care Management: CRR required this visit?  No   CCM required this visit?  No      Plan:     I have personally reviewed and noted the following in the patients chart:   Medical and social history Use of alcohol, tobacco or illicit drugs  Current medications and supplements including opioid prescriptions.  Functional ability and status Nutritional status Physical activity Advanced directives List of other physicians Hospitalizations, surgeries, and ER visits in previous 12 months Vitals Screenings to include cognitive, depression, and falls Referrals and appointments  In addition, I have reviewed and discussed with patient certain preventive protocols, quality metrics, and best practice recommendations. A written personalized care plan for preventive services as well as general preventive health recommendations were provided to patient.     SSheral Flow LPN   21/01/8865  Nurse Notes:  Patient is cogitatively intact. There were no vitals filed for this visit. There is no height or weight on file to calculate BMI.

## 2021-02-20 NOTE — Patient Instructions (Signed)
Ms. Flury , Thank you for taking time to come for your Medicare Wellness Visit. I appreciate your ongoing commitment to your health goals. Please review the following plan we discussed and let me know if I can assist you in the future.   Screening recommendations/referrals: Colonoscopy: 07/15/2016; due every 10 years (due 07/16/2026) Mammogram: 04/03/2020; due every year (due 04/03/2021) Bone Density: 04/24/2020; due every 2-3 years (due 04/25/2022-2025) Recommended yearly ophthalmology/optometry visit for glaucoma screening and checkup Recommended yearly dental visit for hygiene and checkup  Vaccinations: Influenza vaccine: 11/03/2020; due every Fall season Pneumococcal vaccine: 01/25/2013, 10/21/2017 Tdap vaccine: 11/11/2011; due every 10 years (due 11/10/2021) Shingles vaccine: 10/21/2017, 12/25/2017   Covid-19: 02/02/2019, 02/22/2019, 11/30/2019, 11/03/2020  Advanced directives: Please bring a copy of your health care power of attorney and living will to the office at your convenience.  Conditions/risks identified: My goal is to lose 20 pounds by being more physically active, staying hydrated and get my HgA1C down to normal range.  Next appointment: Please schedule your next Medicare Wellness Visit with your Nurse Health Advisor in 1 year by calling [phone].   Preventive Care 38 Years and Older, Female Preventive care refers to lifestyle choices and visits with your health care provider that can promote health and wellness. What does preventive care include? A yearly physical exam. This is also called an annual well check. Dental exams once or twice a year. Routine eye exams. Ask your health care provider how often you should have your eyes checked. Personal lifestyle choices, including: Daily care of your teeth and gums. Regular physical activity. Eating a healthy diet. Avoiding tobacco and drug use. Limiting alcohol use. Practicing safe sex. Taking low-dose aspirin every  day. Taking vitamin and mineral supplements as recommended by your health care provider. What happens during an annual well check? The services and screenings done by your health care provider during your annual well check will depend on your age, overall health, lifestyle risk factors, and family history of disease. Counseling  Your health care provider may ask you questions about your: Alcohol use. Tobacco use. Drug use. Emotional well-being. Home and relationship well-being. Sexual activity. Eating habits. History of falls. Memory and ability to understand (cognition). Work and work Astronomer. Reproductive health. Screening  You may have the following tests or measurements: Height, weight, and BMI. Blood pressure. Lipid and cholesterol levels. These may be checked every 5 years, or more frequently if you are over 59 years old. Skin check. Lung cancer screening. You may have this screening every year starting at age 64 if you have a 30-pack-year history of smoking and currently smoke or have quit within the past 15 years. Fecal occult blood test (FOBT) of the stool. You may have this test every year starting at age 85. Flexible sigmoidoscopy or colonoscopy. You may have a sigmoidoscopy every 5 years or a colonoscopy every 10 years starting at age 70. Hepatitis C blood test. Hepatitis B blood test. Sexually transmitted disease (STD) testing. Diabetes screening. This is done by checking your blood sugar (glucose) after you have not eaten for a while (fasting). You may have this done every 1-3 years. Bone density scan. This is done to screen for osteoporosis. You may have this done starting at age 44. Mammogram. This may be done every 1-2 years. Talk to your health care provider about how often you should have regular mammograms. Talk with your health care provider about your test results, treatment options, and if necessary, the need for more tests.  Vaccines  Your health care  provider may recommend certain vaccines, such as: Influenza vaccine. This is recommended every year. Tetanus, diphtheria, and acellular pertussis (Tdap, Td) vaccine. You may need a Td booster every 10 years. Zoster vaccine. You may need this after age 36. Pneumococcal 13-valent conjugate (PCV13) vaccine. One dose is recommended after age 7. Pneumococcal polysaccharide (PPSV23) vaccine. One dose is recommended after age 50. Talk to your health care provider about which screenings and vaccines you need and how often you need them. This information is not intended to replace advice given to you by your health care provider. Make sure you discuss any questions you have with your health care provider. Document Released: 01/27/2015 Document Revised: 09/20/2015 Document Reviewed: 11/01/2014 Elsevier Interactive Patient Education  2017 [employer].  Fall Prevention in the Home Falls can cause injuries. They can happen to people of all ages. There are many things you can do to make your home safe and to help prevent falls. What can I do on the outside of my home? Regularly fix the edges of walkways and driveways and fix any cracks. Remove anything that might make you trip as you walk through a door, such as a raised step or threshold. Trim any bushes or trees on the path to your home. Use bright outdoor lighting. Clear any walking paths of anything that might make someone trip, such as rocks or tools. Regularly check to see if handrails are loose or broken. Make sure that both sides of any steps have handrails. Any raised decks and porches should have guardrails on the edges. Have any leaves, snow, or ice cleared regularly. Use sand or salt on walking paths during winter. Clean up any spills in your garage right away. This includes oil or grease spills. What can I do in the bathroom? Use night lights. Install grab bars by the toilet and in the tub and shower. Do not use towel bars as grab  bars. Use non-skid mats or decals in the tub or shower. If you need to sit down in the shower, use a plastic, non-slip stool. Keep the floor dry. Clean up any water that spills on the floor as soon as it happens. Remove soap buildup in the tub or shower regularly. Attach bath mats securely with double-sided non-slip rug tape. Do not have throw rugs and other things on the floor that can make you trip. What can I do in the bedroom? Use night lights. Make sure that you have a light by your bed that is easy to reach. Do not use any sheets or blankets that are too big for your bed. They should not hang down onto the floor. Have a firm chair that has side arms. You can use this for support while you get dressed. Do not have throw rugs and other things on the floor that can make you trip. What can I do in the kitchen? Clean up any spills right away. Avoid walking on wet floors. Keep items that you use a lot in easy-to-reach places. If you need to reach something above you, use a strong step stool that has a grab bar. Keep electrical cords out of the way. Do not use floor polish or wax that makes floors slippery. If you must use wax, use non-skid floor wax. Do not have throw rugs and other things on the floor that can make you trip. What can I do with my stairs? Do not leave any items on the stairs. Make sure that  there are handrails on both sides of the stairs and use them. Fix handrails that are broken or loose. Make sure that handrails are as long as the stairways. Check any carpeting to make sure that it is firmly attached to the stairs. Fix any carpet that is loose or worn. Avoid having throw rugs at the top or bottom of the stairs. If you do have throw rugs, attach them to the floor with carpet tape. Make sure that you have a light switch at the top of the stairs and the bottom of the stairs. If you do not have them, ask someone to add them for you. What else can I do to help prevent  falls? Wear shoes that: Do not have high heels. Have rubber bottoms. Are comfortable and fit you well. Are closed at the toe. Do not wear sandals. If you use a stepladder: Make sure that it is fully opened. Do not climb a closed stepladder. Make sure that both sides of the stepladder are locked into place. Ask someone to hold it for you, if possible. Clearly mark and make sure that you can see: Any grab bars or handrails. First and last steps. Where the edge of each step is. Use tools that help you move around (mobility aids) if they are needed. These include: Canes. Walkers. Scooters. Crutches. Turn on the lights when you go into a dark area. Replace any light bulbs as soon as they burn out. Set up your furniture so you have a clear path. Avoid moving your furniture around. If any of your floors are uneven, fix them. If there are any pets around you, be aware of where they are. Review your medicines with your doctor. Some medicines can make you feel dizzy. This can increase your chance of falling. Ask your doctor what other things that you can do to help prevent falls. This information is not intended to replace advice given to you by your health care provider. Make sure you discuss any questions you have with your health care provider. Document Released: 10/27/2008 Document Revised: 06/08/2015 Document Reviewed: 02/04/2014 Elsevier Interactive Patient Education  2017 [employer].

## 2021-03-01 DIAGNOSIS — E119 Type 2 diabetes mellitus without complications: Secondary | ICD-10-CM | POA: Diagnosis not present

## 2021-03-01 DIAGNOSIS — E785 Hyperlipidemia, unspecified: Secondary | ICD-10-CM | POA: Diagnosis not present

## 2021-03-05 NOTE — Progress Notes (Signed)
Subjective:    Patient ID: Anna Cooper, female    DOB: 02/26/1950, 71 y.o.   MRN: [id number]  This visit occurred during the SARS-CoV-2 public health emergency.  Safety protocols were in place, including screening questions prior to the visit, additional usage of staff PPE, and extensive cleaning of exam room while observing appropriate contact time as indicated for disinfecting solutions.    HPI The patient is here for an acute visit.   She is here for an acute visit for cold symptoms.   Her symptoms started over a week ago  She is experiencing nasal congestion, cough - mostly dry, occ brings up phlegm, PND, occ SOB and wheeze.  No fever or sinus pain.   She has tried taking no otc cold medications  Covid test negative on 2/18.      Medications and allergies reviewed with patient and updated if appropriate.  Patient Active Problem List   Diagnosis Date Noted   Tinnitus of both ears 12/15/2020   Bilateral hearing loss 12/15/2020   Elevated alkaline phosphatase level 11/06/2020   Genetic testing 08/30/2016   Fuchs' corneal dystrophy 07/15/2012   Pseudophakia of both eyes 07/15/2012   Routine health maintenance 12/08/2010   Type 2 diabetes with complication (HCC) 11/25/2008   HSV infection 03/10/2007   Hyperlipidemia associated with type 2 diabetes mellitus (HCC) 03/10/2007   Hypertension 03/10/2007    Current Outpatient Medications on File Prior to Visit  Medication Sig Dispense Refill   amLODipine (NORVASC) 5 MG tablet TAKE 1 TABLET (5 MG TOTAL) BY MOUTH DAILY. 90 tablet 3   Cholecalciferol (VITAMIN D3) 1000 UNITS CAPS Take 1 capsule by mouth daily.      ezetimibe (ZETIA) 10 MG tablet TAKE 1 TABLET EVERY DAY 90 tablet 3   fluorometholone (FML) 0.1 % ophthalmic suspension Place 1 drop into both eyes daily.      Glucosamine-Chondroit-Vit C-Mn (GLUCOSAMINE 1500 COMPLEX PO) Take 1,500 mg by mouth 2 (two) times daily.     hydrochlorothiazide (MICROZIDE) 12.5 MG  capsule TAKE 1 CAPSULE (12.5 MG TOTAL) BY MOUTH DAILY. 90 capsule 3   Multiple Vitamins-Minerals (CENTRUM SILVER ADULT 50+ PO) Take 1 tablet by mouth daily.      potassium chloride (KLOR-CON M) 10 MEQ tablet TAKE 1 TABLET (10 MEQ TOTAL) BY MOUTH DAILY 90 tablet 3   simvastatin (ZOCOR) 20 MG tablet TAKE 1 TABLET EVERY DAY 90 tablet 3   [DISCONTINUED] potassium chloride (K-DUR) 10 MEQ tablet Take 1 tablet (10 mEq total) by mouth daily. 30 tablet 11   No current facility-administered medications on file prior to visit.    Past Medical History:  Diagnosis Date   Fuchs' corneal dystrophy    s/p  bilateral corneal transplant at [facility] -- right 09-24-2011 and left 02-11-2012   History of recurrent UTIs    History of venereal warts    Hyperlipidemia    Hypertension    Infantile paralysis    Major depression    PMB (postmenopausal bleeding)    S/P removal of thyroid nodule 01/14/2017   Type 2 diabetes mellitus (HCC) 03/31/2016   not on meds currently    Past Surgical History:  Procedure Laterality Date   BREAST BIOPSY Right    BUNIONECTOMY WITH HAMMERTOE RECONSTRUCTION Bilateral right 07/ 2013;  left 11/ 2013   CATARACT EXTRACTION W/ INTRAOCULAR LENS  IMPLANT, BILATERAL  right 09-24-2011;  left 02-11-2012   [facility]   and both eye had corneal transplant DSAEK at same time  cataract extraction   COLONOSCOPY     DILATATION & CURETTAGE/HYSTEROSCOPY WITH MYOSURE N/A 06/24/2016   Procedure: DILATATION & CURETTAGE/HYSTEROSCOPY WITH MYOSURE;  Surgeon: Ranae Pila, MD;  Location: [facility];  Service: Gynecology;  Laterality: N/A;   TUBAL LIGATION  1980's    Social History   Socioeconomic History   Marital status: Legally Separated    Spouse name: Not on file   Number of children: 2   Years of education: 20   Highest education level: Not on file  Occupational History   Occupation: educator  Tobacco Use   Smoking status: Never   Smokeless tobacco: Never  Vaping Use    Vaping Use: Never used  Substance and Sexual Activity   Alcohol use: No   Drug use: No   Sexual activity: Not Currently  Other Topics Concern   Not on file  Social History Narrative   HS[employer], College [place], [place], [place]; [place] [employer]; [employer]- education. Married '71-seperated-'06.1 son '71, 1 daughter-'78; 2 grandchildren. 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 Daughter had a baby Jun 11, 2008, @nd  Aug 12th,'11. Lives in [place]. Not sexually active   Social Determinants of Health   Financial Resource Strain: Low Risk    Difficulty of Paying Living Expenses: Not hard at all  Food Insecurity: No Food Insecurity   Worried About Programme researcher, broadcasting/film/video in the Last Year: Never true   Ran Out of Food in the Last Year: Never true  Transportation Needs: No Transportation Needs   Lack of Transportation (Medical): No   Lack of Transportation (Non-Medical): No  Physical Activity: Sufficiently Active   Days of Exercise per Week: 5 days   Minutes of Exercise per Session: 30 min  Stress: No Stress Concern Present   Feeling of Stress : Not at all  Social Connections: Moderately Integrated   Frequency of Communication with Friends and Family: More than three times a week   Frequency of Social Gatherings with Friends and Family: Once a week   Attends Religious Services: 1 to 4 times per year   Active Member of [employer] or Organizations: Yes   Attends Banker Meetings: 1 to 4 times per year   Marital Status: Separated    Family History  Problem Relation Age of Onset   Diabetes Mother    Breast cancer Mother 60       dx late 23's, was in remission but had relapse with mets. Died at 24   Heart attack Father    Seizures Father    Heart disease Father        CAD/MI-fatal   Breast cancer Sister 74       dx early 31's, BRCA2+, now is 32   Breast cancer Sister 23       dx. in 40's, BRCA2-,    Cancer Maternal Grandfather        Lung Cancer   Breast  cancer Maternal Grandfather    Prostate cancer Maternal Uncle 28       dx and died in his 29's   Colon cancer Maternal Aunt 16       dx in 25's, had 12 in of colon removed, died in her 7's   Alzheimer's disease Paternal Aunt    Breast cancer Other 35       BRCA2 status unk (did not share result with famiy)   Kidney cancer Other 35       dx in  30's, is no in his 30's   Breast cancer Cousin 9   Pancreatic cancer Cousin 65       died in 77's   Lung cancer Maternal Uncle 7       dx 60's/70's, now is 66   Stomach cancer Cousin 40       died at 45   Cancer Cousin 1       dx and died in 12-26-2022. type of cancer unk   Throat cancer Cousin 65       died in 31's   Cancer Cousin        dx age unk, died in 47's/60's. type of CA unk   Cancer Cousin        dx age unk, died in 74's. type of CA unk   Cancer Cousin        age dx unk, died in 56's    Review of Systems  Constitutional:  Negative for chills and fever.  HENT:  Positive for congestion and postnasal drip (sometimes). Negative for ear pain, sinus pressure, sinus pain and sore throat.        No change in taste and smell  Respiratory:  Positive for cough (dry, occ productive), shortness of breath and wheezing (occ). Negative for chest tightness.   Gastrointestinal:  Negative for diarrhea and nausea.  Musculoskeletal:  Negative for myalgias.  Neurological:  Negative for dizziness, light-headedness and headaches.      Objective:   Vitals:   03/06/21 0803  BP: 132/80  Pulse: 97  Temp: 99 F (37.2 C)  SpO2: 99%   BP Readings from Last 3 Encounters:  03/06/21 132/80  12/15/20 124/62  11/06/20 122/84   Wt Readings from Last 3 Encounters:  03/06/21 162 lb 12.8 oz (73.8 kg)  12/15/20 170 lb 12.8 oz (77.5 kg)  11/06/20 174 lb 9.6 oz (79.2 kg)   Body mass index is 27.09 kg/m.   Physical Exam Constitutional:      General: She is not in acute distress.    Appearance: Normal appearance. She is not ill-appearing.  HENT:      Head: Normocephalic and atraumatic.     Right Ear: Tympanic membrane, ear canal and external ear normal.     Left Ear: Tympanic membrane, ear canal and external ear normal.     Mouth/Throat:     Mouth: Mucous membranes are moist.     Pharynx: No oropharyngeal exudate or posterior oropharyngeal erythema.  Eyes:     Conjunctiva/sclera: Conjunctivae normal.  Cardiovascular:     Rate and Rhythm: Normal rate and regular rhythm.  Pulmonary:     Effort: Pulmonary effort is normal. No respiratory distress.     Breath sounds: Wheezing present. No rales.     Comments: Cough throughout exam, not able to take deep breath without excessive coughing Musculoskeletal:     Cervical back: Neck supple. No tenderness.  Lymphadenopathy:     Cervical: No cervical adenopathy.  Skin:    General: Skin is warm and dry.  Neurological:     Mental Status: She is alert.           Assessment & Plan:    Acute bronchitis, wheezing on exam:  Acute Concern for bacterial cause Significant tightness,expiratory wheeze diffusely on exam Cxr today Depo-medrol 80 mg IM x1 Start Augmentin 875-125 mg bid x 10 days Start medrol dose pak tomorrow am Can take otc cold medications prn Rest, fluids Call if no improvement

## 2021-03-06 ENCOUNTER — Encounter: Payer: Self-pay | Admitting: Internal Medicine

## 2021-03-06 ENCOUNTER — Ambulatory Visit (INDEPENDENT_AMBULATORY_CARE_PROVIDER_SITE_OTHER): Payer: Medicare PPO

## 2021-03-06 ENCOUNTER — Ambulatory Visit (INDEPENDENT_AMBULATORY_CARE_PROVIDER_SITE_OTHER): Payer: Medicare PPO | Admitting: Internal Medicine

## 2021-03-06 ENCOUNTER — Other Ambulatory Visit: Payer: Self-pay

## 2021-03-06 VITALS — BP 132/80 | HR 97 | Temp 99.0°F | Ht 65.0 in | Wt 162.8 lb

## 2021-03-06 DIAGNOSIS — R059 Cough, unspecified: Secondary | ICD-10-CM | POA: Diagnosis not present

## 2021-03-06 DIAGNOSIS — Z7689 Persons encountering health services in other specified circumstances: Secondary | ICD-10-CM | POA: Diagnosis not present

## 2021-03-06 DIAGNOSIS — R062 Wheezing: Secondary | ICD-10-CM

## 2021-03-06 DIAGNOSIS — J209 Acute bronchitis, unspecified: Secondary | ICD-10-CM | POA: Diagnosis not present

## 2021-03-06 MED ORDER — METHYLPREDNISOLONE 4 MG PO TBPK: ORAL_TABLET | ORAL | 0 refills | Status: DC

## 2021-03-06 MED ORDER — METHYLPREDNISOLONE ACETATE 80 MG/ML IJ SUSP
80.0000 mg | Freq: Once | INTRAMUSCULAR | Status: AC
  Administered 2021-03-06: 80 mg via INTRAMUSCULAR

## 2021-03-06 MED ORDER — AMOXICILLIN-POT CLAVULANATE 875-125 MG PO TABS: 1.0000 | ORAL_TABLET | Freq: Two times a day (BID) | ORAL | 0 refills | Status: DC

## 2021-03-06 NOTE — Addendum Note (Signed)
Addended by: Karma Ganja on: 03/06/2021 01:17 PM   Modules accepted: Orders

## 2021-03-06 NOTE — Patient Instructions (Addendum)
° °  You received a steroid injection today.     Chest xray ordered - have this done downstairs.      Medications changes include :   augmentin twice daily x 10 days.  Start the oral steroid tomorrow morning with breakfast.     Your prescription(s) have been sent to your pharmacy.    Return if symptoms worsen or fail to improve.

## 2021-03-15 ENCOUNTER — Other Ambulatory Visit: Payer: Self-pay | Admitting: Internal Medicine

## 2021-03-15 DIAGNOSIS — Z1231 Encounter for screening mammogram for malignant neoplasm of breast: Secondary | ICD-10-CM

## 2021-04-04 ENCOUNTER — Ambulatory Visit
Admission: RE | Admit: 2021-04-04 | Discharge: 2021-04-04 | Disposition: A | Discharge status: Home / Self Care | Payer: Medicare PPO | Source: Ambulatory Visit | Attending: Internal Medicine | Admitting: Internal Medicine

## 2021-04-04 DIAGNOSIS — Z1231 Encounter for screening mammogram for malignant neoplasm of breast: Secondary | ICD-10-CM

## 2021-04-13 DIAGNOSIS — I10 Essential (primary) hypertension: Secondary | ICD-10-CM | POA: Diagnosis not present

## 2021-04-13 DIAGNOSIS — M8580 Other specified disorders of bone density and structure, unspecified site: Secondary | ICD-10-CM | POA: Diagnosis not present

## 2021-04-13 DIAGNOSIS — E119 Type 2 diabetes mellitus without complications: Secondary | ICD-10-CM | POA: Diagnosis not present

## 2021-04-13 DIAGNOSIS — E049 Nontoxic goiter, unspecified: Secondary | ICD-10-CM | POA: Diagnosis not present

## 2021-04-13 DIAGNOSIS — Z6829 Body mass index (BMI) 29.0-29.9, adult: Secondary | ICD-10-CM | POA: Diagnosis not present

## 2021-04-13 DIAGNOSIS — E041 Nontoxic single thyroid nodule: Secondary | ICD-10-CM | POA: Diagnosis not present

## 2021-04-13 DIAGNOSIS — E785 Hyperlipidemia, unspecified: Secondary | ICD-10-CM | POA: Diagnosis not present

## 2021-05-21 ENCOUNTER — Other Ambulatory Visit: Payer: Self-pay | Admitting: Family Medicine

## 2021-05-21 DIAGNOSIS — Z006 Encounter for examination for normal comparison and control in clinical research program: Secondary | ICD-10-CM

## 2021-06-07 ENCOUNTER — Other Ambulatory Visit: Payer: No Typology Code available for payment source

## 2021-06-08 ENCOUNTER — Other Ambulatory Visit: Payer: No Typology Code available for payment source

## 2021-06-12 ENCOUNTER — Ambulatory Visit
Admission: RE | Admit: 2021-06-12 | Discharge: 2021-06-12 | Disposition: A | Discharge status: Home / Self Care | Payer: No Typology Code available for payment source | Source: Ambulatory Visit | Attending: Family Medicine | Admitting: Family Medicine

## 2021-06-12 DIAGNOSIS — Z006 Encounter for examination for normal comparison and control in clinical research program: Secondary | ICD-10-CM

## 2021-07-30 ENCOUNTER — Other Ambulatory Visit: Payer: Self-pay | Admitting: Family Medicine

## 2021-07-30 DIAGNOSIS — Z006 Encounter for examination for normal comparison and control in clinical research program: Secondary | ICD-10-CM

## 2021-08-14 ENCOUNTER — Ambulatory Visit
Admission: RE | Admit: 2021-08-14 | Discharge: 2021-08-14 | Disposition: A | Discharge status: Home / Self Care | Payer: No Typology Code available for payment source | Source: Ambulatory Visit | Attending: Family Medicine | Admitting: Family Medicine

## 2021-08-14 DIAGNOSIS — Z7689 Persons encountering health services in other specified circumstances: Secondary | ICD-10-CM | POA: Diagnosis not present

## 2021-08-14 DIAGNOSIS — Z006 Encounter for examination for normal comparison and control in clinical research program: Secondary | ICD-10-CM

## 2021-08-29 DIAGNOSIS — H40053 Ocular hypertension, bilateral: Secondary | ICD-10-CM | POA: Diagnosis not present

## 2021-09-18 ENCOUNTER — Other Ambulatory Visit: Payer: Self-pay | Admitting: Family Medicine

## 2021-09-18 DIAGNOSIS — Z006 Encounter for examination for normal comparison and control in clinical research program: Secondary | ICD-10-CM

## 2021-09-28 ENCOUNTER — Telehealth (INDEPENDENT_AMBULATORY_CARE_PROVIDER_SITE_OTHER): Payer: Medicare PPO | Admitting: Nurse Practitioner

## 2021-09-28 DIAGNOSIS — U071 COVID-19: Secondary | ICD-10-CM | POA: Insufficient documentation

## 2021-09-28 MED ORDER — MOLNUPIRAVIR EUA 200MG CAPSULE: 4.0000 | ORAL_CAPSULE | Freq: Two times a day (BID) | ORAL | 0 refills | Status: AC

## 2021-09-28 NOTE — Progress Notes (Signed)
Established Patient Office Visit  An audio-only tele-health visit was completed today for this patient. I connected with  Anna Cooper on 09/28/21 utilizing audio-only technology and verified that I am speaking with the correct person using two identifiers. The patient was located at their home, and I was located at the office of [facility] at [facility] during the encounter. I discussed the limitations of evaluation and management by telemedicine. The patient expressed understanding and agreed to proceed.     Subjective   Patient ID: Anna Cooper, female    DOB: 1950-03-30  Age: 71 y.o. MRN: [id number]  Chief Complaint  Patient presents with   Covid Positive    Symptoms started 4 days ago.  Took at home test today was positive.  Symptoms seem to be improving.  Does have history of hypertension, diabetes, and is greater than 9 years of age.  She has been vaccinated with boosters.  Is able to sleep well.  Does report shortness of breath with exertion.    Review of Systems  Constitutional:  Positive for fever (broken since monday).  HENT:  Positive for congestion.        (+) loss of smell, (+) loss of taste  Respiratory:  Positive for cough, sputum production (small amounts) and shortness of breath (with exertion).   Cardiovascular:  Negative for chest pain, palpitations and leg swelling.  Gastrointestinal:  Positive for abdominal pain ((+) bloating, (-) pain) and nausea ((+) resolved on monday). Negative for diarrhea and vomiting.  Musculoskeletal:  Positive for back pain. Negative for myalgias.  Skin:  Negative for rash.  Neurological:  Positive for dizziness and headaches.      Objective:     There were no vitals taken for this visit.   Physical Exam Comprehensive physical exam not completed today as office visit was conducted remotely.  Patient sounded well over the phone, no coughing occurred during visit.  She is able to speak in complete sentences  without having to stop to take a deep breath.  Patient was alert and oriented, and appeared to have appropriate judgment.   No results found for any visits on 09/28/21.    The 10-year ASCVD risk score (Arnett DK, et al., 2019) is: 21.4%    Assessment & Plan:   Problem List Items Addressed This Visit       Other   COVID-19 - Primary    Acute, and window to take antiviral medication.  Patient is appropriate candidate based on age and comorbidities.  Due to her multiple chronic medications recommend molnupiravir.  Patient is agreeable, prescription sent to patient's pharmacy.  She feels she can sleep well and does not feel cough is severe thus just recommend supportive care at home.  Educated on current quarantine isolation recommendations.  Patient educated to look for signs/symptoms of worsening disease and when to call 911.  Patient reports understanding.      Relevant Medications   molnupiravir EUA (LAGEVRIO) 200 mg CAPS capsule    Return if symptoms worsen or fail to improve.  Total time spent telephone was 14 minutes.   Elenore Paddy, NP

## 2021-09-28 NOTE — Assessment & Plan Note (Signed)
Acute, and window to take antiviral medication.  Patient is appropriate candidate based on age and comorbidities.  Due to her multiple chronic medications recommend molnupiravir.  Patient is agreeable, prescription sent to patient's pharmacy.  She feels she can sleep well and does not feel cough is severe thus just recommend supportive care at home.  Educated on current quarantine isolation recommendations.  Patient educated to look for signs/symptoms of worsening disease and when to call 911.  Patient reports understanding.

## 2021-10-02 ENCOUNTER — Other Ambulatory Visit: Payer: No Typology Code available for payment source

## 2021-10-03 ENCOUNTER — Other Ambulatory Visit: Payer: Self-pay | Admitting: Student

## 2021-10-10 ENCOUNTER — Ambulatory Visit
Admission: RE | Admit: 2021-10-10 | Discharge: 2021-10-10 | Disposition: A | Discharge status: Home / Self Care | Payer: No Typology Code available for payment source | Source: Ambulatory Visit | Attending: Family Medicine | Admitting: Family Medicine

## 2021-10-10 DIAGNOSIS — Z006 Encounter for examination for normal comparison and control in clinical research program: Secondary | ICD-10-CM

## 2021-10-23 DIAGNOSIS — Z9189 Other specified personal risk factors, not elsewhere classified: Secondary | ICD-10-CM | POA: Diagnosis not present

## 2021-10-23 DIAGNOSIS — R140 Abdominal distension (gaseous): Secondary | ICD-10-CM | POA: Diagnosis not present

## 2021-10-23 DIAGNOSIS — Z01419 Encounter for gynecological examination (general) (routine) without abnormal findings: Secondary | ICD-10-CM | POA: Diagnosis not present

## 2021-10-23 DIAGNOSIS — Z6829 Body mass index (BMI) 29.0-29.9, adult: Secondary | ICD-10-CM | POA: Diagnosis not present

## 2021-10-25 ENCOUNTER — Other Ambulatory Visit: Payer: Self-pay | Admitting: Obstetrics and Gynecology

## 2021-10-25 DIAGNOSIS — Z9189 Other specified personal risk factors, not elsewhere classified: Secondary | ICD-10-CM

## 2021-10-29 DIAGNOSIS — E785 Hyperlipidemia, unspecified: Secondary | ICD-10-CM | POA: Diagnosis not present

## 2021-10-29 DIAGNOSIS — E049 Nontoxic goiter, unspecified: Secondary | ICD-10-CM | POA: Diagnosis not present

## 2021-10-29 DIAGNOSIS — E118 Type 2 diabetes mellitus with unspecified complications: Secondary | ICD-10-CM | POA: Diagnosis not present

## 2021-11-05 ENCOUNTER — Other Ambulatory Visit: Payer: Self-pay | Admitting: Home Modifications

## 2021-11-05 DIAGNOSIS — E1165 Type 2 diabetes mellitus with hyperglycemia: Secondary | ICD-10-CM | POA: Diagnosis not present

## 2021-11-05 DIAGNOSIS — E049 Nontoxic goiter, unspecified: Secondary | ICD-10-CM | POA: Diagnosis not present

## 2021-11-05 DIAGNOSIS — E785 Hyperlipidemia, unspecified: Secondary | ICD-10-CM | POA: Diagnosis not present

## 2021-11-05 DIAGNOSIS — I10 Essential (primary) hypertension: Secondary | ICD-10-CM | POA: Diagnosis not present

## 2021-11-06 ENCOUNTER — Ambulatory Visit
Admission: RE | Admit: 2021-11-06 | Discharge: 2021-11-06 | Disposition: A | Discharge status: Home / Self Care | Payer: Medicare PPO | Source: Ambulatory Visit | Attending: Home Modifications | Admitting: Home Modifications

## 2021-11-06 DIAGNOSIS — E041 Nontoxic single thyroid nodule: Secondary | ICD-10-CM | POA: Diagnosis not present

## 2021-11-06 DIAGNOSIS — Z7689 Persons encountering health services in other specified circumstances: Secondary | ICD-10-CM | POA: Diagnosis not present

## 2021-11-06 DIAGNOSIS — E049 Nontoxic goiter, unspecified: Secondary | ICD-10-CM

## 2021-11-07 DIAGNOSIS — E1165 Type 2 diabetes mellitus with hyperglycemia: Secondary | ICD-10-CM | POA: Diagnosis not present

## 2021-11-07 DIAGNOSIS — E785 Hyperlipidemia, unspecified: Secondary | ICD-10-CM | POA: Diagnosis not present

## 2021-11-07 DIAGNOSIS — I10 Essential (primary) hypertension: Secondary | ICD-10-CM | POA: Diagnosis not present

## 2021-11-10 ENCOUNTER — Ambulatory Visit
Admission: RE | Admit: 2021-11-10 | Discharge: 2021-11-10 | Disposition: A | Discharge status: Home / Self Care | Payer: No Typology Code available for payment source | Source: Ambulatory Visit | Attending: Obstetrics and Gynecology | Admitting: Obstetrics and Gynecology

## 2021-11-10 DIAGNOSIS — Z9189 Other specified personal risk factors, not elsewhere classified: Secondary | ICD-10-CM

## 2021-11-10 DIAGNOSIS — Z7689 Persons encountering health services in other specified circumstances: Secondary | ICD-10-CM | POA: Diagnosis not present

## 2021-11-10 MED ORDER — GADOPICLENOL 0.5 MMOL/ML IV SOLN
7.0000 mL | Freq: Once | INTRAVENOUS | Status: AC | PRN
  Administered 2021-11-10: 7 mL via INTRAVENOUS

## 2021-11-16 DIAGNOSIS — R140 Abdominal distension (gaseous): Secondary | ICD-10-CM | POA: Diagnosis not present

## 2021-11-16 DIAGNOSIS — N888 Other specified noninflammatory disorders of cervix uteri: Secondary | ICD-10-CM | POA: Diagnosis not present

## 2021-11-16 DIAGNOSIS — R9389 Abnormal findings on diagnostic imaging of other specified body structures: Secondary | ICD-10-CM | POA: Diagnosis not present

## 2021-11-20 DIAGNOSIS — R9389 Abnormal findings on diagnostic imaging of other specified body structures: Secondary | ICD-10-CM | POA: Diagnosis not present

## 2021-12-10 ENCOUNTER — Telehealth: Payer: Self-pay | Admitting: Internal Medicine

## 2021-12-10 NOTE — Telephone Encounter (Signed)
For our records:   We have received Surgical Clearance PW from [facility] for the pt. Please complete the PW ASAP; the pt is scheduled for surgery this Wednesday 11.29.23  Please fax to: [phone]

## 2021-12-10 NOTE — Telephone Encounter (Signed)
Filled out

## 2021-12-10 NOTE — Telephone Encounter (Signed)
Have been placed in her office box pt surgery is this upcoming Wednesday

## 2021-12-12 DIAGNOSIS — N8500 Endometrial hyperplasia, unspecified: Secondary | ICD-10-CM | POA: Diagnosis not present

## 2021-12-12 DIAGNOSIS — R9389 Abnormal findings on diagnostic imaging of other specified body structures: Secondary | ICD-10-CM | POA: Diagnosis not present

## 2021-12-21 ENCOUNTER — Ambulatory Visit (INDEPENDENT_AMBULATORY_CARE_PROVIDER_SITE_OTHER): Payer: Medicare PPO | Admitting: Internal Medicine

## 2021-12-21 ENCOUNTER — Encounter: Payer: Self-pay | Admitting: Internal Medicine

## 2021-12-21 VITALS — BP 124/78 | HR 70 | Temp 98.6°F | Ht 65.0 in | Wt 167.0 lb

## 2021-12-21 DIAGNOSIS — Z0000 Encounter for general adult medical examination without abnormal findings: Secondary | ICD-10-CM

## 2021-12-21 DIAGNOSIS — Z23 Encounter for immunization: Secondary | ICD-10-CM | POA: Diagnosis not present

## 2021-12-21 DIAGNOSIS — E079 Disorder of thyroid, unspecified: Secondary | ICD-10-CM | POA: Diagnosis not present

## 2021-12-21 DIAGNOSIS — E785 Hyperlipidemia, unspecified: Secondary | ICD-10-CM | POA: Diagnosis not present

## 2021-12-21 DIAGNOSIS — I10 Essential (primary) hypertension: Secondary | ICD-10-CM | POA: Diagnosis not present

## 2021-12-21 DIAGNOSIS — E118 Type 2 diabetes mellitus with unspecified complications: Secondary | ICD-10-CM | POA: Diagnosis not present

## 2021-12-21 DIAGNOSIS — E1169 Type 2 diabetes mellitus with other specified complication: Secondary | ICD-10-CM | POA: Diagnosis not present

## 2021-12-21 LAB — MICROALBUMIN / CREATININE URINE RATIO
Creatinine,U: 85.9 mg/dL
Microalb Creat Ratio: 0.8 mg/g (ref 0.0–30.0)
Microalb, Ur: <0.7 mg/dL (ref 0.0–1.9)

## 2021-12-21 LAB — CBC
HCT: 42 % (ref 36.0–46.0)
Hemoglobin: 13.7 g/dL (ref 12.0–15.0)
MCHC: 32.6 g/dL (ref 30.0–36.0)
MCV: 84.9 fl (ref 78.0–100.0)
Platelets: 374 10*3/uL (ref 150.0–400.0)
RBC: 4.94 Mil/uL (ref 3.87–5.11)
RDW: 14.3 % (ref 11.5–15.5)
WBC: 5.2 10*3/uL (ref 4.0–10.5)

## 2021-12-21 LAB — COMPREHENSIVE METABOLIC PANEL
ALT: 13 U/L (ref 0–35)
AST: 21 U/L (ref 0–37)
Albumin: 4.3 g/dL (ref 3.5–5.2)
Alkaline Phosphatase: 76 U/L (ref 39–117)
BUN: 14 mg/dL (ref 6–23)
CO2: 28 mEq/L (ref 19–32)
Calcium: 9.7 mg/dL (ref 8.4–10.5)
Chloride: 103 mEq/L (ref 96–112)
Creatinine, Ser: 0.67 mg/dL (ref 0.40–1.20)
GFR: 87.17 mL/min (ref >60.00)
Glucose, Bld: 126 mg/dL — ABNORMAL HIGH (ref 70–99)
Potassium: 3.7 mEq/L (ref 3.5–5.1)
Sodium: 140 mEq/L (ref 135–145)
Total Bilirubin: 0.3 mg/dL (ref 0.2–1.2)
Total Protein: 7.6 g/dL (ref 6.0–8.3)

## 2021-12-21 LAB — LIPID PANEL
Cholesterol: 115 mg/dL (ref 0–200)
HDL: 46.1 mg/dL (ref >39.00)
LDL Cholesterol: 56 mg/dL (ref 0–99)
NonHDL: 69.18
Total CHOL/HDL Ratio: 3
Triglycerides: 65 mg/dL (ref 0.0–149.0)
VLDL: 13 mg/dL (ref 0.0–40.0)

## 2021-12-21 LAB — HEMOGLOBIN A1C: Hgb A1c MFr Bld: 7.3 % — ABNORMAL HIGH (ref 4.6–6.5)

## 2021-12-21 MED ORDER — AMLODIPINE BESYLATE 5 MG PO TABS: 5.0000 mg | ORAL_TABLET | Freq: Every day | ORAL | 3 refills | Status: DC

## 2021-12-21 MED ORDER — EZETIMIBE 10 MG PO TABS: 10.0000 mg | ORAL_TABLET | Freq: Every day | ORAL | 3 refills | Status: DC

## 2021-12-21 MED ORDER — SIMVASTATIN 20 MG PO TABS: 20.0000 mg | ORAL_TABLET | Freq: Every day | ORAL | 3 refills | Status: DC

## 2021-12-21 MED ORDER — POTASSIUM CHLORIDE CRYS ER 10 MEQ PO TBCR: 10.0000 meq | EXTENDED_RELEASE_TABLET | Freq: Every day | ORAL | 3 refills | Status: DC

## 2021-12-21 MED ORDER — HYDROCHLOROTHIAZIDE 12.5 MG PO CAPS: 12.5000 mg | ORAL_CAPSULE | Freq: Every day | ORAL | 3 refills | Status: DC

## 2021-12-21 NOTE — Assessment & Plan Note (Signed)
Flu shot given. Covid-19 counseled. Pneumonia complete. Shingrix complete. Tetanus due at pharmacy. Colonoscopy due 2028. Mammogram up to date, pap smear aged out and dexa up to date. Counseled about sun safety and mole surveillance. Counseled about the dangers of distracted driving. Given 10 year screening recommendations.

## 2021-12-21 NOTE — Assessment & Plan Note (Signed)
Checking lipid panel and adjust simvastatin 20 mg daily.  °

## 2021-12-21 NOTE — Assessment & Plan Note (Signed)
Foot exam done, checking HgA1c and microalbumin to creatinine ratio and lipid panel. Taking metformin 500 mg daily and adjust as needed. Eye exam up to date. On statin.

## 2021-12-21 NOTE — Patient Instructions (Signed)
Think about getting RSV and tetanus shot at the pharmacy.

## 2021-12-21 NOTE — Assessment & Plan Note (Signed)
BP at goal on amlodipine 5 mg daily and hctz 12.5 mg daily. Checking CMP and adjust as needed.  

## 2021-12-21 NOTE — Progress Notes (Signed)
Subjective:   Patient ID: Anna Cooper, female    DOB: Apr 07, 1950, 71 y.o.   MRN: [id number]  HPI The patient is here for physical.  PMH, [facility], social history reviewed and updated  Review of Systems  Constitutional: Negative.   HENT: Negative.    Eyes: Negative.   Respiratory:  Negative for cough, chest tightness and shortness of breath.   Cardiovascular:  Negative for chest pain, palpitations and leg swelling.  Gastrointestinal:  Negative for abdominal distention, abdominal pain, constipation, diarrhea, nausea and vomiting.  Musculoskeletal: Negative.   Skin: Negative.   Neurological: Negative.   Psychiatric/Behavioral: Negative.      Objective:  Physical Exam Constitutional:      Appearance: She is well-developed.  HENT:     Head: Normocephalic and atraumatic.  Cardiovascular:     Rate and Rhythm: Normal rate and regular rhythm.  Pulmonary:     Effort: Pulmonary effort is normal. No respiratory distress.     Breath sounds: Normal breath sounds. No wheezing or rales.  Abdominal:     General: Bowel sounds are normal. There is no distension.     Palpations: Abdomen is soft.     Tenderness: There is no abdominal tenderness. There is no rebound.  Musculoskeletal:     Cervical back: Normal range of motion.  Skin:    General: Skin is warm and dry.  Neurological:     Mental Status: She is alert and oriented to person, place, and time.     Coordination: Coordination normal.     Vitals:   12/21/21 0817  BP: 124/78  Pulse: 70  Temp: 98.6 F (37 C)  TempSrc: Oral  SpO2: 99%  Weight: 167 lb (75.8 kg)  Height: 5\' 5"  (1.651 m)    Assessment & Plan:  Flu shot given at visit

## 2021-12-21 NOTE — Assessment & Plan Note (Signed)
Recently checked by endo and controlled.

## 2022-01-03 ENCOUNTER — Ambulatory Visit (HOSPITAL_COMMUNITY)
Admission: RE | Admit: 2022-01-03 | Discharge: 2022-01-03 | Disposition: A | Discharge status: Home / Self Care | Payer: Medicare PPO | Source: Ambulatory Visit | Attending: Internal Medicine | Admitting: Internal Medicine

## 2022-01-03 DIAGNOSIS — I10 Essential (primary) hypertension: Secondary | ICD-10-CM | POA: Insufficient documentation

## 2022-01-04 ENCOUNTER — Other Ambulatory Visit: Payer: Self-pay | Admitting: Internal Medicine

## 2022-01-04 DIAGNOSIS — Z1231 Encounter for screening mammogram for malignant neoplasm of breast: Secondary | ICD-10-CM

## 2022-02-05 DIAGNOSIS — B351 Tinea unguium: Secondary | ICD-10-CM | POA: Diagnosis not present

## 2022-02-05 DIAGNOSIS — E1165 Type 2 diabetes mellitus with hyperglycemia: Secondary | ICD-10-CM | POA: Diagnosis not present

## 2022-02-05 DIAGNOSIS — E785 Hyperlipidemia, unspecified: Secondary | ICD-10-CM | POA: Diagnosis not present

## 2022-02-05 DIAGNOSIS — E118 Type 2 diabetes mellitus with unspecified complications: Secondary | ICD-10-CM | POA: Diagnosis not present

## 2022-02-05 DIAGNOSIS — E049 Nontoxic goiter, unspecified: Secondary | ICD-10-CM | POA: Diagnosis not present

## 2022-02-12 ENCOUNTER — Ambulatory Visit: Payer: Medicare PPO | Admitting: Podiatry

## 2022-02-12 DIAGNOSIS — B351 Tinea unguium: Secondary | ICD-10-CM | POA: Diagnosis not present

## 2022-02-12 NOTE — Progress Notes (Signed)
Subjective:   Patient ID: Anna Cooper, female   DOB: 72 y.o.   MRN: [id number]   HPI Chief Complaint  Patient presents with   onychomychosis     Bilateral feet     72 year old female presents for above concerns.  In 2021 she was on terbinafine but she did not see much improvement, just a little. She gets occasional pain to the nails, sometimes feel like ingrowns but not all the time. No drainage or other signs of infection.   Last A1c was 7.3 on 12/21/2021  Review of Systems  All other systems reviewed and are negative.  Past Medical History:  Diagnosis Date   Fuchs' corneal dystrophy    s/p  bilateral corneal transplant at [place] -- right 09-24-2011 and left 02-11-2012   History of recurrent UTIs    History of venereal warts    Hyperlipidemia    Hypertension    Infantile paralysis    Major depression    PMB (postmenopausal bleeding)    S/P removal of thyroid nodule 01/14/2017   Type 2 diabetes mellitus ([place]) 03/31/2016   not on meds currently    Past Surgical History:  Procedure Laterality Date   BREAST BIOPSY Right 11/10/2020   BREAST BIOPSY Left 2007   BUNIONECTOMY WITH HAMMERTOE RECONSTRUCTION Bilateral right 07/ 2013;  left 11/ 2013   CATARACT EXTRACTION W/ INTRAOCULAR LENS  IMPLANT, BILATERAL  right 09-24-2011;  left 02-11-2012   [facility]   and both eye had corneal transplant DSAEK at same time cataract extraction   COLONOSCOPY     [place] N/A 06/24/2016   Procedure: [place];  Surgeon: Tyson Dense, MD;  [facility];  Service: Gynecology;  Laterality: N/A;   TUBAL LIGATION  1980's     Current Outpatient Medications:    amLODipine (NORVASC) 5 MG tablet, Take 1 tablet (5 mg total) by mouth daily., Disp: 90 tablet, Rfl: 3   Cholecalciferol (VITAMIN D3) 1000 UNITS CAPS, Take 1 capsule by mouth daily. , Disp: , Rfl:    ezetimibe (ZETIA) 10 MG tablet,  Take 1 tablet (10 mg total) by mouth daily., Disp: 90 tablet, Rfl: 3   fluorometholone (FML) 0.1 % ophthalmic suspension, Place 1 drop into both eyes daily. , Disp: , Rfl:    Glucosamine-Chondroit-Vit C-Mn (GLUCOSAMINE 1500 COMPLEX PO), Take 1,500 mg by mouth 2 (two) times daily., Disp: , Rfl:    hydrochlorothiazide (MICROZIDE) 12.5 MG capsule, Take 1 capsule (12.5 mg total) by mouth daily., Disp: 90 capsule, Rfl: 3   metFORMIN (GLUCOPHAGE-XR) 500 MG 24 hr tablet, Take 500 mg by mouth daily., Disp: , Rfl:    Multiple Vitamins-Minerals (CENTRUM SILVER ADULT 50+ PO), Take 1 tablet by mouth daily. , Disp: , Rfl:    potassium chloride (KLOR-CON M) 10 MEQ tablet, Take 1 tablet (10 mEq total) by mouth daily., Disp: 90 tablet, Rfl: 3   simvastatin (ZOCOR) 20 MG tablet, Take 1 tablet (20 mg total) by mouth daily., Disp: 90 tablet, Rfl: 3  No Known Allergies        Objective:  Physical Exam  General: AAO x3, NAD  Dermatological: Nails are hypertrophic and dystrophic with yellow, brown discoloration.  No edema, erythema, drainage pus or any signs of infection.  No open lesions.  Vascular: Dorsalis Pedis artery and Posterior Tibial artery pedal pulses are palpable bilateral with immedate capillary fill time. There is no pain with calf compression, swelling, warmth, erythema.  Neruologic: Grossly intact via light touch bilateral.   Musculoskeletal: No gross boney pedal deformities bilateral. No pain, crepitus, or limitation noted with foot and ankle range of motion bilateral. Muscular strength 5/5 in all groups tested bilateral.  Gait: Unassisted, Nonantalgic.       Assessment:   Onychomycosis     Plan:  -Treatment options discussed including all alternatives, risks, and complications -Etiology of symptoms were discussed -She has previously been on oral medication minimal improvement topical to the toenails.  I sharply debrided the nails with any complications or bleeding symptom for  culture, pathology.  Will await results of the culture before proceeding with further treatment.  Trula Slade DPM

## 2022-02-21 ENCOUNTER — Ambulatory Visit (INDEPENDENT_AMBULATORY_CARE_PROVIDER_SITE_OTHER): Payer: Medicare PPO

## 2022-02-21 VITALS — Ht 65.0 in | Wt 172.0 lb

## 2022-02-21 DIAGNOSIS — Z0000 Encounter for general adult medical examination without abnormal findings: Secondary | ICD-10-CM

## 2022-02-21 NOTE — Progress Notes (Signed)
Virtual Visit via Telephone Note  I connected with  Anna Cooper on 02/21/22 at  3:00 PM EST by telephone and verified that I am speaking with the correct person using two identifiers.  Location: Patient: Home Provider: [place] Persons participating in the virtual visit: [place]   I discussed the limitations, risks, security and privacy concerns of performing an evaluation and management service by telephone and the availability of in person appointments. The patient expressed understanding and agreed to proceed.  Interactive audio and video telecommunications were attempted between this nurse and patient, however failed, due to patient having technical difficulties OR patient did not have access to video capability.  We continued and completed visit with audio only.  Some vital signs may be absent or patient reported.   Sheral Flow, LPN  Subjective:   Anna Cooper is a 72 y.o. female who presents for Medicare Annual (Subsequent) preventive examination.  Review of Systems     Cardiac Risk Factors include: advanced age (>39mn, >>46women);diabetes mellitus;dyslipidemia;family history of premature cardiovascular disease;hypertension     Objective:    Today's Vitals   02/21/22 1502  Weight: 172 lb (78 kg)  Height: 5' 5"$  (1.651 m)  PainSc: 0-No pain   Body mass index is 28.62 kg/m.     02/21/2022    3:04 PM 02/20/2021    3:44 PM 02/18/2020    8:24 AM 07/01/2017    9:58 AM 10/11/2016   10:15 AM 07/01/2016   11:03 AM 06/24/2016    6:16 AM  Advanced Directives  Does Patient Have a Medical Advance Directive? Yes Yes No Yes Yes Yes Yes  Type of AParamedicof AWest BrattleboroLiving will Living will;Healthcare Power of AMcDonaldLiving will HH[place]Living will HValentineLiving will HSylvan SpringsLiving will  Does patient want to make changes  to medical advance directive?  No - Patient declined     No - Patient declined  Copy of HFerndalein Chart? No - copy requested No - copy requested  No - copy requested No - copy requested  No - copy requested  Would patient like information on creating a medical advance directive?   No - Patient declined        Current Medications (verified) Outpatient Encounter Medications as of 02/21/2022  Medication Sig   amLODipine (NORVASC) 5 MG tablet Take 1 tablet (5 mg total) by mouth daily.   Cholecalciferol (VITAMIN D3) 1000 UNITS CAPS Take 1 capsule by mouth daily.    ezetimibe (ZETIA) 10 MG tablet Take 1 tablet (10 mg total) by mouth daily.   fluorometholone (FML) 0.1 % ophthalmic suspension Place 1 drop into both eyes daily.    Glucosamine-Chondroit-Vit C-Mn (GLUCOSAMINE 1500 COMPLEX PO) Take 1,500 mg by mouth 2 (two) times daily.   hydrochlorothiazide (MICROZIDE) 12.5 MG capsule Take 1 capsule (12.5 mg total) by mouth daily.   metFORMIN (GLUCOPHAGE-XR) 500 MG 24 hr tablet Take 500 mg by mouth daily.   Multiple Vitamins-Minerals (CENTRUM SILVER ADULT 50+ PO) Take 1 tablet by mouth daily.    potassium chloride (KLOR-CON M) 10 MEQ tablet Take 1 tablet (10 mEq total) by mouth daily.   simvastatin (ZOCOR) 20 MG tablet Take 1 tablet (20 mg total) by mouth daily.   [DISCONTINUED] potassium chloride (K-DUR) 10 MEQ tablet Take 1 tablet (10 mEq total) by mouth daily.   No facility-administered encounter medications on file as  of 02/21/2022.    Allergies (verified) Patient has no known allergies.   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 and left 02-11-2012   History of recurrent UTIs    History of venereal warts    Hyperlipidemia    Hypertension    Infantile paralysis    Major depression    PMB (postmenopausal bleeding)    S/P removal of thyroid nodule 01/14/2017   Type 2  diabetes mellitus ([place]) 03/31/2016   not on meds currently   Past Surgical History:  Procedure Laterality Date   BREAST BIOPSY Right 11/10/2020   BREAST BIOPSY Left 2007   BUNIONECTOMY WITH HAMMERTOE RECONSTRUCTION Bilateral right 07/ 2013;  left 11/ 2013   CATARACT EXTRACTION W/ INTRAOCULAR LENS  IMPLANT, BILATERAL  right 09-24-2011;  left 02-11-2012   [facility]   and both eye had corneal transplant DSAEK at same time cataract extraction   COLONOSCOPY     [place] N/A 06/24/2016   Procedure: [place];  Surgeon: Tyson Dense, MD;  Location: [facility];  Service: Gynecology;  Laterality: N/A;   TUBAL LIGATION  1980's   Family History  Problem Relation Age of Onset   Diabetes Mother    Breast cancer Mother 80       dx late 47's, was in remission but had relapse with mets. Died at 60   Heart attack Father    Seizures Father    Heart disease Father        CAD/MI-fatal   Breast cancer Sister 39       dx early 28's, BRCA2+, now is 16   Breast cancer Sister 94       dx. in 40's, BRCA2-,    Cancer Maternal Grandfather        Lung Cancer   Breast cancer Maternal Grandfather    Prostate cancer Maternal Uncle 80       dx and died in his 31's   Colon cancer Maternal Aunt 77       dx in 67's, had 26 in of colon removed, died in her 63's   Alzheimer's disease Paternal Aunt    Breast cancer Other 49       BRCA2 status unk (did not share result with famiy)   Kidney cancer Other 35       dx in 31's, is no in his 38's   Breast cancer Cousin 57   Pancreatic cancer Cousin 75       died in 72's   Lung cancer Maternal Uncle 30       dx 60's/70's, now is 61   Stomach cancer Cousin 40       died at 66   [place] 24       dx and died in 25's. type of cancer unk   Throat cancer Cousin 94       died in 56's   Cancer Cousin        dx age unk, died in 56's/60's. type of CA unk   Cancer  Cousin        dx age unk, died in 63's. type of CA unk   Cancer Cousin        age dx unk, died in 7's   Social History   Socioeconomic History   Marital status: Legally Separated    Spouse name:  Not on file   Number of children: 2   Years of education: 20   Highest education level: Not on file  Occupational History   Occupation: educator  Tobacco Use   Smoking status: Never   Smokeless tobacco: Never  Vaping Use   Vaping Use: Never used  Substance and Sexual Activity   Alcohol use: No   Drug use: No   Sexual activity: Not Currently  Other Topics Concern   Not on file  Social History Narrative   HSG, College [place], [place], [place]; [place]; [employer]- education. Married '71-seperated-'06.1 son '71, 1 daughter-'78; 2 grandchildren. 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 Daughter had a baby Jun 11, 2008, @nd$  Aug 12th,'11. Lives in [place]. Not sexually active   Social Determinants of Health   Financial Resource Strain: Low Risk  (02/20/2021)   Overall Financial Resource Strain (CARDIA)    Difficulty of Paying Living Expenses: Not hard at all  Food Insecurity: No Food Insecurity (02/21/2022)   Hunger Vital Sign    Worried About Running Out of Food in the Last Year: Never true    Ran Out of Food in the Last Year: Never true  Transportation Needs: No Transportation Needs (02/21/2022)   PRAPARE - Hydrologist (Medical): No    Lack of Transportation (Non-Medical): No  Physical Activity: Sufficiently Active (02/21/2022)   Exercise Vital Sign    Days of Exercise per Week: 5 days    Minutes of Exercise per Session: 30 min  Stress: No Stress Concern Present (02/21/2022)   [place]    Feeling of Stress : Not at all  Social Connections: Moderately Integrated (02/21/2022)   Social Connection and Isolation Panel [NHANES]    Frequency of Communication with  Friends and Family: More than three times a week    Frequency of Social Gatherings with Friends and Family: Once a week    Attends Religious Services: 1 to 4 times per year    Active Member of [employer] or Organizations: Yes    Attends Archivist Meetings: 1 to 4 times per year    Marital Status: Separated    Tobacco Counseling Counseling given: Not Answered   Clinical Intake:  Pre-visit preparation completed: Yes  Pain : No/denies pain Pain Score: 0-No pain     BMI - recorded: 28.62 Nutritional Status: BMI 25 -29 Overweight Nutritional Risks: None Diabetes: No  How often do you need to have someone help you when you read instructions, pamphlets, or other written materials from your doctor or pharmacy?: 1 - Never What is the last grade level you completed in school?: College Graduate  Nutrition Risk Assessment:  Has the patient had any N/V/D within the last 2 months?  No  Does the patient have any non-healing wounds?  No  Has the patient had any unintentional weight loss or weight gain?  No   Diabetes:  Is the patient diabetic?  Yes  If diabetic, was a CBG obtained today?  No  Did the patient bring in their glucometer from home?  No  How often do you monitor your CBG's? No; just on metformin.   Financial Strains and Diabetes Management:  Are you having any financial strains with the device, your supplies or your medication? No .  Does the patient want to be seen by Chronic Care Management for management of their diabetes?  No  Would the  patient like to be referred to a Nutritionist or for Diabetic Management?  No   Diabetic Exams:  Diabetic Eye Exam: Completed 09/13/2021 Diabetic Foot Exam: Completed 12/21/2021   Interpreter Needed?: No  Information entered by :: Aaylah Pokorny N. Mingo Siegert, LPN.   Activities of Daily Living    02/21/2022    3:13 PM  In your present state of health, do you have any difficulty performing the following activities:  Hearing? 0   Vision? 0  Difficulty concentrating or making decisions? 0  Walking or climbing stairs? 0  Dressing or bathing? 0  Doing errands, shopping? 0  Preparing Food and eating ? N  Using the Toilet? N  In the past six months, have you accidently leaked urine? N  Do you have problems with loss of bowel control? N  Managing your Medications? N  Managing your Finances? N  Housekeeping or managing your Housekeeping? N    Patient Care Team: Hoyt Koch, MD as PCP - General (Internal Medicine) Newt Minion, MD as Consulting Physician (Orthopedic Surgery) Shamleffer, Melanie Crazier, MD as Consulting Physician (Endocrinology) Tyson Dense, MD as Consulting Physician (Obstetrics and Gynecology) Charletta Cousin, MD as Referring Physician (Ophthalmology)  Indicate any recent Medical Services you may have received from other than [facility] providers in the past year (date may be approximate).     Assessment:   This is a routine wellness examination for Anna Cooper.  Hearing/Vision screen Hearing Screening - Comments:: Denies hearing difficulties.  Vision Screening - Comments:: Wears rx glasses - up to date with routine eye exams with Lovie Macadamia, MD at [place] issues and exercise activities discussed: Current Exercise Habits: Home exercise routine, Type of exercise: walking, Time (Minutes): 30, Frequency (Times/Week): 5, Weekly Exercise (Minutes/Week): 150, Intensity: Moderate, Exercise limited by: None identified   Goals Addressed             This Visit's Progress    Client will verbalize knowledge of diabetes self-management as evidenced by Hgb A1C <7 or as defined by provider.            Depression Screen    02/21/2022    3:09 PM 12/21/2021    8:15 AM 02/20/2021    3:50 PM 02/18/2020    8:58 AM 10/23/2018    9:17 AM 07/01/2017    9:58 AM 10/11/2016   10:15 AM  PHQ 2/9 Scores  PHQ - 2 Score 0 0 0 0 0 0 1  PHQ- 9 Score  3     3    Fall  Risk    02/21/2022    3:05 PM 12/21/2021    8:15 AM 02/20/2021    3:45 PM 02/18/2020    8:24 AM 11/30/2019    2:01 PM  [place] in the past year? 0 0 0 1 1  Number falls in past yr: 0 0 0 0 0  Injury with Fall? 0 0 0 1 1  Risk for fall due to : No Fall Risks No Fall Risks No Fall Risks    Follow up Falls prevention discussed Falls evaluation completed Falls evaluation completed Falls evaluation completed     [place]:  Any stairs in or around the home? Yes  If so, are there any without handrails? No  Home free of loose throw rugs in walkways, pet beds, electrical cords, etc? Yes  Adequate lighting in your home to reduce risk  of falls? Yes   ASSISTIVE DEVICES UTILIZED TO PREVENT FALLS:  Life alert? No  Use of a cane, walker or w/c? No  Grab bars in the bathroom? No  Shower chair or bench in shower? No  Elevated toilet seat or a handicapped toilet? No   TIMED UP AND GO:  Was the test performed? No . Phone Visit  Cognitive Function:    08/29/2015    8:35 AM  MMSE - Mini Mental State Exam  Orientation to time 5  Orientation to Place 5  Registration 3  Attention/ Calculation 5  Recall 3  Language- name 2 objects 2  Language- repeat 1  Language- follow 3 step command 3  Language- read & follow direction 1  Write a sentence 1  Copy design 1  Total score 30        02/21/2022    3:05 PM  6CIT Screen  What Year? 0 points  What month? 0 points  What time? 0 points  Count back from 20 0 points  Months in reverse 0 points  Repeat phrase 0 points  Total Score 0 points    Immunizations Immunization History  Administered Date(s) Administered   COVID-19, mRNA, vaccine(Comirnaty)12 years and older 12/25/2021   Fluad Quad(high Dose 65+) 10/23/2018, 12/21/2021   Influenza Split 11/06/2011   Influenza, High Dose Seasonal PF 09/28/2015, 10/11/2016, 10/21/2017   Influenza,inj,Quad PF,6+ Mos 10/28/2012, 11/16/2013    Influenza-Unspecified 10/15/2014, 11/02/2019, 11/03/2020   PFIZER(Purple Top)SARS-COV-2 Vaccination 02/02/2019, 02/22/2019, 11/30/2019, 06/22/2020   Pfizer Covid-19 Vaccine Bivalent Booster 79yr & up 11/03/2020   Pneumococcal Conjugate-13 01/25/2013   Pneumococcal Polysaccharide-23 11/06/2011, 10/21/2017   Tdap 11/11/2011   Unspecified SARS-COV-2 Vaccination 02/02/2019, 02/22/2019, 11/30/2019, 06/22/2020   Zoster Recombinat (Shingrix) 10/21/2017, 12/25/2017   Zoster, Live 11/11/2011    TDAP status: Due, Education has been provided regarding the importance of this vaccine. Advised may receive this vaccine at local pharmacy or Health Dept. Aware to provide a copy of the vaccination record if obtained from local pharmacy or Health Dept. Verbalized acceptance and understanding.  Flu Vaccine status: Up to date  Pneumococcal vaccine status: Up to date  Covid-19 vaccine status: Completed vaccines  Qualifies for Shingles Vaccine? Yes   Zostavax completed Yes   Shingrix Completed?: Yes  Screening Tests Health Maintenance  Topic Date Due   DTaP/Tdap/Td (2 - Td or Tdap) 11/10/2021   MAMMOGRAM  04/05/2022   HEMOGLOBIN A1C  06/22/2022   OPHTHALMOLOGY EXAM  09/14/2022   Diabetic kidney evaluation - eGFR measurement  12/22/2022   Diabetic kidney evaluation - Urine ACR  12/22/2022   FOOT EXAM  12/22/2022   Medicare Annual Wellness (AWV)  02/22/2023   COLONOSCOPY (Pts 45-469yrInsurance coverage will need to be confirmed)  07/16/2026   Pneumonia Vaccine 6552Years old  Completed   INFLUENZA VACCINE  Completed   DEXA SCAN  Completed   COVID-19 Vaccine  Completed   Hepatitis C Screening  Completed   Zoster Vaccines- Shingrix  Completed   HPV VACCINES  Aged Out    Health Maintenance  Health Maintenance Due  Topic Date Due   DTaP/Tdap/Td (2 - Td or Tdap) 11/10/2021    Colorectal cancer screening: Type of screening: Colonoscopy. Completed 07/15/2016. Repeat every 10 years  Mammogram  status: Completed 04/04/2021. Repeat every year scheduled for 04/12/2022  Bone Density status: Completed 04/24/2020. Results reflect: Bone density results: OSTEOPENIA. Repeat every 2-3 years.  Lung Cancer Screening: (Low Dose CT Chest recommended if Age 73-80ears, 3081  pack-year currently smoking OR have quit w/in 15years.) does not qualify.   Lung Cancer Screening Referral: no  Additional Screening:  Hepatitis C Screening: does qualify; Completed 09/28/2015  Vision Screening: Recommended annual ophthalmology exams for early detection of glaucoma and other disorders of the eye. Is the patient up to date with their annual eye exam?  Yes  Who is the provider or what is the name of the office in which the patient attends annual eye exams? Lovie Macadamia, MD at [facility] If pt is not established with a provider, would they like to be referred to a provider to establish care? No .   Dental Screening: Recommended annual dental exams for proper oral hygiene  Community Resource Referral / Chronic Care Management: CRR required this visit?  No   CCM required this visit?  No      Plan:     I have personally reviewed and noted the following in the patient's chart:   Medical and social history Use of alcohol, tobacco or illicit drugs  Current medications and supplements including opioid prescriptions. Patient is not currently taking opioid prescriptions. Functional ability and status Nutritional status Physical activity Advanced directives List of other physicians Hospitalizations, surgeries, and ER visits in previous 12 months Vitals Screenings to include cognitive, depression, and falls Referrals and appointments  In addition, I have reviewed and discussed with patient certain preventive protocols, quality metrics, and best practice recommendations. A written personalized care plan for preventive services as well as general preventive health recommendations were provided to patient.      Sheral Flow, LPN   [id number]   Nurse Notes: N/A

## 2022-02-21 NOTE — Patient Instructions (Addendum)
Anna Cooper , Thank you for taking time to come for your Medicare Wellness Visit. I appreciate your ongoing commitment to your health goals. Please review the following plan we discussed and let me know if I can assist you in the future.   These are the goals we discussed:  Goals      Client will verbalize knowledge of diabetes self-management as evidenced by Hgb A1C <7 or as defined by provider.            This is a list of the screening recommended for you and due dates:  Health Maintenance  Topic Date Due   DTaP/Tdap/Td vaccine (2 - Td or Tdap) 11/10/2021   Mammogram  04/05/2022   Hemoglobin A1C  06/22/2022   Eye exam for diabetics  09/14/2022   Yearly kidney function blood test for diabetes  12/22/2022   Yearly kidney health urinalysis for diabetes  12/22/2022   Complete foot exam   12/22/2022   Medicare Annual Wellness Visit  02/22/2023   Colon Cancer Screening  07/16/2026   Pneumonia Vaccine  Completed   Flu Shot  Completed   DEXA scan (bone density measurement)  Completed   COVID-19 Vaccine  Completed   Hepatitis C Screening: USPSTF Recommendation to screen - Ages 41-79 yo.  Completed   Zoster (Shingles) Vaccine  Completed   HPV Vaccine  Aged Out   Immunization History  Administered Date(s) Administered   COVID-19, mRNA, vaccine(Comirnaty)12 years and older 12/25/2021   Fluad Quad(high Dose 65+) 10/23/2018, 12/21/2021   Influenza Split 11/06/2011   Influenza, High Dose Seasonal PF 09/28/2015, 10/11/2016, 10/21/2017   Influenza,inj,Quad PF,6+ Mos 10/28/2012, 11/16/2013   Influenza-Unspecified 10/15/2014, 11/02/2019, 11/03/2020   PFIZER(Purple Top)SARS-COV-2 Vaccination 02/02/2019, 02/22/2019, 11/30/2019, 06/22/2020   Pfizer Covid-19 Vaccine Bivalent Booster 79yrs & up 11/03/2020   Pneumococcal Conjugate-13 01/25/2013   Pneumococcal Polysaccharide-23 11/06/2011, 10/21/2017   Tdap 11/11/2011   Unspecified SARS-COV-2 Vaccination 02/02/2019, 02/22/2019, 11/30/2019,  06/22/2020   Zoster Recombinat (Shingrix) 10/21/2017, 12/25/2017   Zoster, Live 11/11/2011   Advanced directives: Yes; Please bring a copy of your health care power of attorney and living will to the office at your convenience.  Conditions/risks identified: Yes; Type II Diabetes  Next appointment: Follow up in one year for your annual wellness visit.   Preventive Care 61 Years and Older, Female Preventive care refers to lifestyle choices and visits with your health care provider that can promote health and wellness. What does preventive care include? A yearly physical exam. This is also called an annual well check. Dental exams once or twice a year. Routine eye exams. Ask your health care provider how often you should have your eyes checked. Personal lifestyle choices, including: Daily care of your teeth and gums. Regular physical activity. Eating a healthy diet. Avoiding tobacco and drug use. Limiting alcohol use. Practicing safe sex. Taking low-dose aspirin every day. Taking vitamin and mineral supplements as recommended by your health care provider. What happens during an annual well check? The services and screenings done by your health care provider during your annual well check will depend on your age, overall health, lifestyle risk factors, and family history of disease. Counseling  Your health care provider may ask you questions about your: Alcohol use. Tobacco use. Drug use. Emotional well-being. Home and relationship well-being. Sexual activity. Eating habits. History of falls. Memory and ability to understand (cognition). Work and work Statistician. Reproductive health. Screening  You may have the following tests or measurements: Height, weight, and BMI. Blood  pressure. Lipid and cholesterol levels. These may be checked every 5 years, or more frequently if you are over 55 years old. Skin check. Lung cancer screening. You may have this screening every year  starting at age 32 if you have a 30-pack-year history of smoking and currently smoke or have quit within the past 15 years. Fecal occult blood test (FOBT) of the stool. You may have this test every year starting at age 4. Flexible sigmoidoscopy or colonoscopy. You may have a sigmoidoscopy every 5 years or a colonoscopy every 10 years starting at age 62. Hepatitis C blood test. Hepatitis B blood test. Sexually transmitted disease (STD) testing. Diabetes screening. This is done by checking your blood sugar (glucose) after you have not eaten for a while (fasting). You may have this done every 1-3 years. Bone density scan. This is done to screen for osteoporosis. You may have this done starting at age 27. Mammogram. This may be done every 1-2 years. Talk to your health care provider about how often you should have regular mammograms. Talk with your health care provider about your test results, treatment options, and if necessary, the need for more tests. Vaccines  Your health care provider may recommend certain vaccines, such as: Influenza vaccine. This is recommended every year. Tetanus, diphtheria, and acellular pertussis (Tdap, Td) vaccine. You may need a Td booster every 10 years. Zoster vaccine. You may need this after age 37. Pneumococcal 13-valent conjugate (PCV13) vaccine. One dose is recommended after age 65. Pneumococcal polysaccharide (PPSV23) vaccine. One dose is recommended after age 45. Talk to your health care provider about which screenings and vaccines you need and how often you need them. This information is not intended to replace advice given to you by your health care provider. Make sure you discuss any questions you have with your health care provider. Document Released: 01/27/2015 Document Revised: 09/20/2015 Document Reviewed: 11/01/2014 Elsevier Interactive Patient Education  2017 [place] Prevention in the Home Falls can cause injuries. They can happen to  people of all ages. There are many things you can do to make your home safe and to help prevent falls. What can I do on the outside of my home? Regularly fix the edges of walkways and driveways and fix any cracks. Remove anything that might make you trip as you walk through a door, such as a raised step or threshold. Trim any bushes or trees on the path to your home. Use bright outdoor lighting. Clear any walking paths of anything that might make someone trip, such as rocks or tools. Regularly check to see if handrails are loose or broken. Make sure that both sides of any steps have handrails. Any raised decks and porches should have guardrails on the edges. Have any leaves, snow, or ice cleared regularly. Use sand or salt on walking paths during winter. Clean up any spills in your garage right away. This includes oil or grease spills. What can I do in the bathroom? Use night lights. Install grab bars by the toilet and in the tub and shower. Do not use towel bars as grab bars. Use non-skid mats or decals in the tub or shower. If you need to sit down in the shower, use a plastic, non-slip stool. Keep the floor dry. Clean up any water that spills on the floor as soon as it happens. Remove soap buildup in the tub or shower regularly. Attach bath mats securely with double-sided non-slip rug tape. Do not have throw  rugs and other things on the floor that can make you trip. What can I do in the bedroom? Use night lights. Make sure that you have a light by your bed that is easy to reach. Do not use any sheets or blankets that are too big for your bed. They should not hang down onto the floor. Have a firm chair that has side arms. You can use this for support while you get dressed. Do not have throw rugs and other things on the floor that can make you trip. What can I do in the kitchen? Clean up any spills right away. Avoid walking on wet floors. Keep items that you use a lot in easy-to-reach  places. If you need to reach something above you, use a strong step stool that has a grab bar. Keep electrical cords out of the way. Do not use floor polish or wax that makes floors slippery. If you must use wax, use non-skid floor wax. Do not have throw rugs and other things on the floor that can make you trip. What can I do with my stairs? Do not leave any items on the stairs. Make sure that there are handrails on both sides of the stairs and use them. Fix handrails that are broken or loose. Make sure that handrails are as long as the stairways. Check any carpeting to make sure that it is firmly attached to the stairs. Fix any carpet that is loose or worn. Avoid having throw rugs at the top or bottom of the stairs. If you do have throw rugs, attach them to the floor with carpet tape. Make sure that you have a light switch at the top of the stairs and the bottom of the stairs. If you do not have them, ask someone to add them for you. What else can I do to help prevent falls? Wear shoes that: Do not have high heels. Have rubber bottoms. Are comfortable and fit you well. Are closed at the toe. Do not wear sandals. If you use a stepladder: Make sure that it is fully opened. Do not climb a closed stepladder. Make sure that both sides of the stepladder are locked into place. Ask someone to hold it for you, if possible. Clearly mark and make sure that you can see: Any grab bars or handrails. First and last steps. Where the edge of each step is. Use tools that help you move around (mobility aids) if they are needed. These include: Canes. Walkers. Scooters. Crutches. Turn on the lights when you go into a dark area. Replace any light bulbs as soon as they burn out. Set up your furniture so you have a clear path. Avoid moving your furniture around. If any of your floors are uneven, fix them. If there are any pets around you, be aware of where they are. Review your medicines with your doctor.  Some medicines can make you feel dizzy. This can increase your chance of falling. Ask your doctor what other things that you can do to help prevent falls. This information is not intended to replace advice given to you by your health care provider. Make sure you discuss any questions you have with your health care provider. Document Released: 10/27/2008 Document Revised: 06/08/2015 Document Reviewed: 02/04/2014 Elsevier Interactive Patient Education  2017 [employer].

## 2022-02-25 ENCOUNTER — Other Ambulatory Visit: Payer: Self-pay

## 2022-02-25 DIAGNOSIS — B351 Tinea unguium: Secondary | ICD-10-CM

## 2022-03-04 ENCOUNTER — Ambulatory Visit: Payer: Medicare PPO | Admitting: Podiatry

## 2022-03-04 DIAGNOSIS — Z79899 Other long term (current) drug therapy: Secondary | ICD-10-CM

## 2022-03-04 DIAGNOSIS — B351 Tinea unguium: Secondary | ICD-10-CM | POA: Diagnosis not present

## 2022-03-04 NOTE — Patient Instructions (Signed)
Itraconazole Capsules or Tablets What is this medication? ITRACONAZOLE (i tra KO na zole) treats fungal infections. It belongs to a group of medications called antifungals. It will not treat colds, the flu, or infections caused by bacteria or viruses. This medicine may be used for other purposes; ask your health care provider or pharmacist if you have questions. COMMON BRAND NAME(S): ONMEL, Sporanox, TOLSURA What should I tell my care team before I take this medication? They need to know if you have any of these conditions: COPD, including chronic bronchitis or emphysema Heart disease Immune system problems Kidney disease Liver disease An unusual or allergic reaction to itraconazole, other medications, foods, dyes, or preservatives Pregnant or trying to get pregnant Breast-feeding How should I use this medication? Take this medication by mouth. Take it as directed on the prescription label at the same time every day. Do not cut, crush, or chew this medication. Swallow whole. Take it with food. Keep taking it unless your care team tells you to stop. Take antacids, H2-blockers, or proton pump inhibitors at a different time of day than this medication. Take these products 2 hours BEFORE or 2 hours AFTER this medication. Talk to your care team about the use of this medication in children. Special care may be needed. Overdosage: If you think you have taken too much of this medicine contact a poison control center or emergency room at once. NOTE: This medicine is only for you. Do not share this medicine with others. What if I miss a dose? If you miss a dose, take it as soon as you can. If it is almost time for your next dose, take only that dose. Do not take double or extra doses. What may interact with this medication? Do not take this medication with any of the following: Adagrasib Alfuzosin Alprazolam Avanafil Certain medications for blood pressure, such as felodipine, nisoldipine Certain  medications for cholesterol, such as cerivastatin, lovastatin, simvastatin, lomitapide Certain medications for the heart, such as disopyramide, dofetilide, dronedarone, eplerenone, ivabradine, quinidine, ranolazine Cisapride Colchicine (if you have liver or kidney problems) Conivaptan Ergot alkaloids, such as dihydroergotamine, ergonovine, ergotamine, methylergonovine Irinotecan Isavuconazole Lurasidone Methadone Midazolam Naloxegol Nevirapine Other medications that cause heart rhythm changes Pimozide Red yeast rice Sirolimus Thioridazine Ticagrelor Triazolam Venetoclax This medication may also interact with the following: Aliskiren Amlodipine Antacids Aprepitant Atorvastatin Buprenorphine Certain antibiotics, such as ciprofloxacin, clarithromycin, erythromycin Certain antivirals for HIV or hepatitis Certain medications for bladder problems, such as fesoterodine, solifenacin, tolterodine Certain medications for cancer, such as axitinib, bortezomib, busulfan, dabrafenib, dasatinib, docetaxel, erlotinib, gefitinib, ibrutinib, imatinib, ixabepilone, lapatinib, nilotinib, ponatinib, sunitinib, trabectedin, trimetrexate, vinca alkaloids Certain medications for erectile dysfunction, such as vardenafil, sildenafil, tadalafil Certain medications for mental health conditions, such as aripiprazole, buspirone, diazepam, haloperidol, perospirone, quetiapine, risperidone Certain medications for pain, such as alfentanil, fentanyl, oxycodone, sufentanil Certain medications for seizures, such as carbamazepine, phenytoin Certain medications for tuberculosis, such as isoniazid, rifabutin, rifampin, rifapentine Certain medications that treat or prevent blood clots, such as warfarin, dabigatran, rivaroxaban Cilostazol Cinacalcet Cyclosporine Digoxin Eletriptan Everolimus Halofantrine Isradipine Meloxicam Nadolol Nifedipine Other medications for fungal  infections Praziquantel Ramelteon Repaglinide Salmeterol Saxagliptin Steroid medications, such as prednisone or cortisone Tacrolimus Tamsulosin Tolvaptan Verapamil Ziprasidone This list may not describe all possible interactions. Give your health care provider a list of all the medicines, herbs, non-prescription drugs, or dietary supplements you use. Also tell them if you smoke, drink alcohol, or use illegal drugs. Some items may interact with your medicine. What should I watch  for while using this medication? Visit your care team for regular checks on your progress. Tell your care team if your symptoms do not start to get better or if they get worse. You may need blood work while taking this medication. This medication may affect your coordination, reaction time, or judgment. Do not drive or operate machinery until you know how this medication affects you. Sit up or stand slowly to reduce the risk of dizzy or fainting spells. What side effects may I notice from receiving this medication? Side effects that you should report to your doctor or health care team as soon as possible: Allergic reactions--skin rash, itching, hives, swelling of the face, lips, tongue, or throat Hearing loss Heart failure--shortness of breath, swelling of the ankles, feet, or hands, sudden weight gain, unusual weakness or fatigue Liver injury--right upper belly pain, loss of appetite, nausea, light-colored stool, dark yellow or brown urine, yellowing skin or eyes, unusual weakness or fatigue Pain, tingling, or numbness in the hands or feet Side effects that usually do not require medical attention (report to your doctor or health care team if they continue or are bothersome): Blurry vision Diarrhea Dizziness Headache Nausea Vomiting This list may not describe all possible side effects. Call your doctor for medical advice about side effects. You may report side effects to FDA at 1-800-FDA-1088. Where should I  keep my medication? Keep out of the reach of children and pets. Store between 15 and 25 degrees C (59 and 77 degrees F). Protect from light and moisture. Keep the container tightly closed. Get rid of any unused medication after the expiration date. To get rid of medications that are no longer needed or have expired: Take the medication to a medication take-back program. Check with your pharmacy or law enforcement to find a location. If you cannot return the medication, check the label or package insert to see if the medication should be thrown out in the garbage or flushed down the toilet. If you are not sure, ask your care team. If it is safe to put it in the trash, take the medication out of the container. Mix the medication with cat litter, dirt, coffee grounds, or other unwanted substance. Seal the mixture in a bag or container. Put it in the trash. Marland Kitchen NOTE: This sheet is a summary. It may not cover all possible information. If you have questions about this medicine, talk to your doctor, pharmacist, or health care provider.  2023 Elsevier/Gold Standard (2020-12-27 00:00:00)

## 2022-03-04 NOTE — Progress Notes (Unsigned)
itraconazole

## 2022-03-05 LAB — CBC WITH DIFFERENTIAL/PLATELET
Basophils Absolute: 0 10*3/uL (ref 0.0–0.2)
Basos: 0 %
EOS (ABSOLUTE): 0.1 10*3/uL (ref 0.0–0.4)
Eos: 2 %
Hematocrit: 43.8 % (ref 34.0–46.6)
Hemoglobin: 14.2 g/dL (ref 11.1–15.9)
Immature Grans (Abs): 0 10*3/uL (ref 0.0–0.1)
Immature Granulocytes: 0 %
Lymphocytes Absolute: 1.9 10*3/uL (ref 0.7–3.1)
Lymphs: 27 %
MCH: 27.7 pg (ref 26.6–33.0)
MCHC: 32.4 g/dL (ref 31.5–35.7)
MCV: 85 fL (ref 79–97)
Monocytes Absolute: 0.4 10*3/uL (ref 0.1–0.9)
Monocytes: 6 %
Neutrophils Absolute: 4.4 10*3/uL (ref 1.4–7.0)
Neutrophils: 65 %
Platelets: 326 10*3/uL (ref 150–450)
RBC: 5.13 x10E6/uL (ref 3.77–5.28)
RDW: 12.9 % (ref 11.7–15.4)
WBC: 6.8 10*3/uL (ref 3.4–10.8)

## 2022-03-05 LAB — HEPATIC FUNCTION PANEL
ALT: 17 IU/L (ref 0–32)
AST: 22 IU/L (ref 0–40)
Albumin: 4.6 g/dL (ref 3.8–4.8)
Alkaline Phosphatase: 106 IU/L (ref 44–121)
Bilirubin Total: 0.2 mg/dL (ref 0.0–1.2)
Bilirubin, Direct: <0.1 mg/dL (ref 0.00–0.40)
Total Protein: 7.4 g/dL (ref 6.0–8.5)

## 2022-03-08 ENCOUNTER — Other Ambulatory Visit: Payer: Self-pay | Admitting: Podiatry

## 2022-03-08 DIAGNOSIS — Z79899 Other long term (current) drug therapy: Secondary | ICD-10-CM

## 2022-03-08 MED ORDER — ITRACONAZOLE 100 MG PO CAPS: ORAL_CAPSULE | ORAL | 0 refills | Status: DC

## 2022-03-14 ENCOUNTER — Telehealth: Payer: Self-pay | Admitting: *Deleted

## 2022-03-16 NOTE — Telephone Encounter (Signed)
error 

## 2022-04-08 DIAGNOSIS — H0289 Other specified disorders of eyelid: Secondary | ICD-10-CM | POA: Diagnosis not present

## 2022-04-08 DIAGNOSIS — H02401 Unspecified ptosis of right eyelid: Secondary | ICD-10-CM | POA: Diagnosis not present

## 2022-04-08 DIAGNOSIS — H02835 Dermatochalasis of left lower eyelid: Secondary | ICD-10-CM | POA: Diagnosis not present

## 2022-04-08 DIAGNOSIS — H02832 Dermatochalasis of right lower eyelid: Secondary | ICD-10-CM | POA: Diagnosis not present

## 2022-04-12 ENCOUNTER — Ambulatory Visit
Admission: RE | Admit: 2022-04-12 | Discharge: 2022-04-12 | Disposition: A | Discharge status: Home / Self Care | Payer: Medicare PPO | Source: Ambulatory Visit | Attending: Internal Medicine | Admitting: Internal Medicine

## 2022-04-12 DIAGNOSIS — Z1231 Encounter for screening mammogram for malignant neoplasm of breast: Secondary | ICD-10-CM

## 2022-04-15 ENCOUNTER — Telehealth: Payer: Self-pay | Admitting: Podiatry

## 2022-04-15 NOTE — Telephone Encounter (Signed)
Pt states that she was told that she needed to have a second order for blood work while taking fungal medication but [facility] does not show a second order.  Please advise

## 2022-04-15 NOTE — Telephone Encounter (Signed)
The order was put in on 03/08/22 the patient can also come by the office and pick up the orders as well.

## 2022-04-16 NOTE — Telephone Encounter (Signed)
Ok thanks [employer]

## 2022-04-16 NOTE — Telephone Encounter (Signed)
She want be able to see it in my chart I don't think. I'll print it out and leave it up front just incase she wants to pick it up.

## 2022-04-16 NOTE — Telephone Encounter (Signed)
Anna Cooper did they patient say if she was going to pick it up from the office?

## 2022-04-17 ENCOUNTER — Encounter: Payer: Self-pay | Admitting: Internal Medicine

## 2022-04-17 LAB — HM MAMMOGRAPHY

## 2022-05-01 DIAGNOSIS — Z79899 Other long term (current) drug therapy: Secondary | ICD-10-CM | POA: Diagnosis not present

## 2022-05-02 LAB — CBC WITH DIFFERENTIAL/PLATELET
Basophils Absolute: 0 10*3/uL (ref 0.0–0.2)
Basos: 1 %
EOS (ABSOLUTE): 0.2 10*3/uL (ref 0.0–0.4)
Eos: 4 %
Hematocrit: 42.2 % (ref 34.0–46.6)
Hemoglobin: 13.6 g/dL (ref 11.1–15.9)
Immature Grans (Abs): 0 10*3/uL (ref 0.0–0.1)
Immature Granulocytes: 0 %
Lymphocytes Absolute: 1.9 10*3/uL (ref 0.7–3.1)
Lymphs: 38 %
MCH: 27.4 pg (ref 26.6–33.0)
MCHC: 32.2 g/dL (ref 31.5–35.7)
MCV: 85 fL (ref 79–97)
Monocytes Absolute: 0.5 10*3/uL (ref 0.1–0.9)
Monocytes: 11 %
Neutrophils Absolute: 2.3 10*3/uL (ref 1.4–7.0)
Neutrophils: 46 %
Platelets: 285 10*3/uL (ref 150–450)
RBC: 4.96 x10E6/uL (ref 3.77–5.28)
RDW: 13.5 % (ref 11.7–15.4)
WBC: 5 10*3/uL (ref 3.4–10.8)

## 2022-05-02 LAB — HEPATIC FUNCTION PANEL
ALT: 23 IU/L (ref 0–32)
AST: 27 IU/L (ref 0–40)
Albumin: 4.6 g/dL (ref 3.8–4.8)
Alkaline Phosphatase: 95 IU/L (ref 44–121)
Bilirubin Total: <0.2 mg/dL (ref 0.0–1.2)
Bilirubin, Direct: <0.1 mg/dL (ref 0.00–0.40)
Total Protein: 6.9 g/dL (ref 6.0–8.5)

## 2022-05-10 ENCOUNTER — Other Ambulatory Visit: Payer: Self-pay

## 2022-05-10 MED ORDER — POTASSIUM CHLORIDE CRYS ER 10 MEQ PO TBCR: 10.0000 meq | EXTENDED_RELEASE_TABLET | Freq: Every day | ORAL | 3 refills | Status: DC

## 2022-05-10 MED ORDER — EZETIMIBE 10 MG PO TABS: 10.0000 mg | ORAL_TABLET | Freq: Every day | ORAL | 3 refills | Status: DC

## 2022-05-10 MED ORDER — AMLODIPINE BESYLATE 5 MG PO TABS: 5.0000 mg | ORAL_TABLET | Freq: Every day | ORAL | 3 refills | Status: DC

## 2022-05-10 MED ORDER — SIMVASTATIN 20 MG PO TABS: 20.0000 mg | ORAL_TABLET | Freq: Every day | ORAL | 3 refills | Status: DC

## 2022-05-10 MED ORDER — HYDROCHLOROTHIAZIDE 12.5 MG PO CAPS: 12.5000 mg | ORAL_CAPSULE | Freq: Every day | ORAL | 3 refills | Status: DC

## 2022-06-03 ENCOUNTER — Ambulatory Visit: Payer: Medicare PPO | Admitting: Podiatry

## 2022-06-03 DIAGNOSIS — Z79899 Other long term (current) drug therapy: Secondary | ICD-10-CM | POA: Diagnosis not present

## 2022-06-03 DIAGNOSIS — B351 Tinea unguium: Secondary | ICD-10-CM

## 2022-06-03 MED ORDER — TERBINAFINE HCL 250 MG PO TABS: 250.0000 mg | ORAL_TABLET | Freq: Every day | ORAL | 0 refills | Status: DC

## 2022-06-03 NOTE — Patient Instructions (Signed)
Terbinafine Tablets What is this medication? TERBINAFINE (TER bin a feen) treats fungal infections of the nails. It belongs to a group of medications called antifungals. It will not treat infections caused by bacteria or viruses. This medicine may be used for other purposes; ask your health care provider or pharmacist if you have questions. COMMON BRAND NAME(S): Lamisil, Terbinex What should I tell my care team before I take this medication? They need to know if you have any of these conditions: Liver disease An unusual or allergic reaction to terbinafine, other medications, foods, dyes, or preservatives Pregnant or trying to get pregnant Breast-feeding How should I use this medication? Take this medication by mouth with water. Take it as directed on the prescription label at the same time every day. You can take it with or without food. If it upsets your stomach, take it with food. Keep taking it unless your care team tells you to stop. A special MedGuide will be given to you by the pharmacist with each prescription and refill. Be sure to read this information carefully each time. Talk to your care team regarding the use of this medication in children. Special care may be needed. Overdosage: If you think you have taken too much of this medicine contact a poison control center or emergency room at once. NOTE: This medicine is only for you. Do not share this medicine with others. What if I miss a dose? If you miss a dose, take it as soon as you can unless it is more than 4 hours late. If it is more than 4 hours late, skip the missed dose. Take the next dose at the normal time. What may interact with this medication? Do not take this medication with any of the following: Pimozide Thioridazine This medication may also interact with the following: Beta blockers Caffeine Certain medications for mental health conditions Cimetidine Cyclosporine Medications for fungal infections like fluconazole  and ketoconazole Medications for irregular heartbeat like amiodarone, flecainide and propafenone Rifampin Warfarin This list may not describe all possible interactions. Give your health care provider a list of all the medicines, herbs, non-prescription drugs, or dietary supplements you use. Also tell them if you smoke, drink alcohol, or use illegal drugs. Some items may interact with your medicine. What should I watch for while using this medication? Visit your care team for regular checks on your progress. You may need blood work while you are taking this medication. It may be some time before you see the benefit from this medication. This medication may cause serious skin reactions. They can happen weeks to months after starting the medication. Contact your care team right away if you notice fevers or flu-like symptoms with a rash. The rash may be red or purple and then turn into blisters or peeling of the skin. Or, you might notice a red rash with swelling of the face, lips or lymph nodes in your neck or under your arms. This medication can make you more sensitive to the sun. Keep out of the sun, If you cannot avoid being in the sun, wear protective clothing and sunscreen. Do not use sun lamps or tanning beds/booths. What side effects may I notice from receiving this medication? Side effects that you should report to your care team as soon as possible: Allergic reactions--skin rash, itching, hives, swelling of the face, lips, tongue, or throat Change in sense of smell Change in taste Infection--fever, chills, cough, or sore throat Liver injury--right upper belly pain, loss of appetite, nausea,   light-colored stool, dark yellow or brown urine, yellowing skin or eyes, unusual weakness or fatigue Low red blood cell level--unusual weakness or fatigue, dizziness, headache, trouble breathing Lupus-like syndrome--joint pain, swelling, or stiffness, butterfly-shaped rash on the face, rashes that get worse  in the sun, fever, unusual weakness or fatigue Rash, fever, and swollen lymph nodes Redness, blistering, peeling, or loosening of the skin, including inside the mouth Unusual bruising or bleeding Worsening mood, feelings of depression Side effects that usually do not require medical attention (report to your care team if they continue or are bothersome): Diarrhea Gas Headache Nausea Stomach pain Upset stomach This list may not describe all possible side effects. Call your doctor for medical advice about side effects. You may report side effects to FDA at 1-800-FDA-1088. Where should I keep my medication? Keep out of the reach of children and pets. Store between 20 and 25 degrees C (68 and 77 degrees F). Protect from light. Get rid of any unused medication after the expiration date. To get rid of medications that are no longer needed or have expired: Take the medication to a medication take-back program. Check with your pharmacy or law enforcement to find a location. If you cannot return the medication, check the label or package insert to see if the medication should be thrown out in the garbage or flushed down the toilet. If you are not sure, ask your care team. If it is safe to put it in the trash, take the medication out of the container. Mix the medication with cat litter, dirt, coffee grounds, or other unwanted substance. Seal the mixture in a bag or container. Put it in the trash. NOTE: This sheet is a summary. It may not cover all possible information. If you have questions about this medicine, talk to your doctor, pharmacist, or health care provider.  2023 Elsevier/Gold Standard (2020-07-25 00:00:00)  

## 2022-06-06 DIAGNOSIS — E1165 Type 2 diabetes mellitus with hyperglycemia: Secondary | ICD-10-CM | POA: Diagnosis not present

## 2022-06-06 DIAGNOSIS — E049 Nontoxic goiter, unspecified: Secondary | ICD-10-CM | POA: Diagnosis not present

## 2022-06-06 DIAGNOSIS — E785 Hyperlipidemia, unspecified: Secondary | ICD-10-CM | POA: Diagnosis not present

## 2022-06-06 DIAGNOSIS — I10 Essential (primary) hypertension: Secondary | ICD-10-CM | POA: Diagnosis not present

## 2022-06-06 NOTE — Progress Notes (Signed)
Subjective: Chief Complaint  Patient presents with   Nail Problem    Nail fungus follow up     72 year old female presents the office for follow-up evaluation of nail fungus.  She said that she is been on itraconazole without any side effects.  No significant pain in the nails and no swelling redness or drainage.  No other concerns.  Objective: AAO x3, NAD DP/PT pulses palpable bilaterally, CRT less than 3 seconds There does appear to be some mild clearing on the proximal nail folds.  No edema, erythema. No open lesions. No pain with calf compression, swelling, warmth, erythema  Assessment: Onychomycosis  Plan: -All treatment options discussed with the patient including all alternatives, risks, complications.  -She has been tolerating the itraconazole well.  There is some mild clearing present but overall symptoms not significantly changed.  We discussed which medications.  Will start Lamisil after discussing risks and we will recheck blood work in 4 weeks an order was written for this.  Return in about 3 months (around 09/03/2022) for nail fungus.  Vivi Barrack DPM

## 2022-07-19 DIAGNOSIS — Z79899 Other long term (current) drug therapy: Secondary | ICD-10-CM | POA: Diagnosis not present

## 2022-07-20 LAB — CBC WITH DIFFERENTIAL/PLATELET
Basophils Absolute: 0 10*3/uL (ref 0.0–0.2)
Basos: 0 %
EOS (ABSOLUTE): 0.2 10*3/uL (ref 0.0–0.4)
Eos: 3 %
Hematocrit: 45.9 % (ref 34.0–46.6)
Hemoglobin: 14.6 g/dL (ref 11.1–15.9)
Immature Grans (Abs): 0 10*3/uL (ref 0.0–0.1)
Immature Granulocytes: 0 %
Lymphocytes Absolute: 1.5 10*3/uL (ref 0.7–3.1)
Lymphs: 25 %
MCH: 26.9 pg (ref 26.6–33.0)
MCHC: 31.8 g/dL (ref 31.5–35.7)
MCV: 85 fL (ref 79–97)
Monocytes Absolute: 0.4 10*3/uL (ref 0.1–0.9)
Monocytes: 6 %
Neutrophils Absolute: 4 10*3/uL (ref 1.4–7.0)
Neutrophils: 66 %
Platelets: 360 10*3/uL (ref 150–450)
RBC: 5.42 x10E6/uL — ABNORMAL HIGH (ref 3.77–5.28)
RDW: 13.9 % (ref 11.7–15.4)
WBC: 6.1 10*3/uL (ref 3.4–10.8)

## 2022-07-20 LAB — HEPATIC FUNCTION PANEL
ALT: 19 IU/L (ref 0–32)
AST: 22 IU/L (ref 0–40)
Albumin: 4.5 g/dL (ref 3.8–4.8)
Alkaline Phosphatase: 147 IU/L — ABNORMAL HIGH (ref 44–121)
Bilirubin Total: <0.2 mg/dL (ref 0.0–1.2)
Bilirubin, Direct: <0.1 mg/dL (ref 0.00–0.40)
Total Protein: 7.7 g/dL (ref 6.0–8.5)

## 2022-07-23 ENCOUNTER — Other Ambulatory Visit: Payer: Self-pay | Admitting: Podiatry

## 2022-07-23 DIAGNOSIS — R899 Unspecified abnormal finding in specimens from other organs, systems and tissues: Secondary | ICD-10-CM

## 2022-08-02 ENCOUNTER — Encounter (HOSPITAL_COMMUNITY): Payer: Self-pay

## 2022-08-02 ENCOUNTER — Emergency Department (HOSPITAL_COMMUNITY)
Admission: EM | Admit: 2022-08-02 | Discharge: 2022-08-03 | Disposition: A | Discharge status: Home / Self Care | Payer: Medicare PPO | Attending: Emergency Medicine | Admitting: Emergency Medicine

## 2022-08-02 ENCOUNTER — Ambulatory Visit (HOSPITAL_COMMUNITY)
Admission: EM | Admit: 2022-08-02 | Discharge: 2022-08-02 | Disposition: A | Discharge status: Other Acute Inpatient Hospital | Payer: Medicare PPO | Attending: Emergency Medicine | Admitting: Emergency Medicine

## 2022-08-02 DIAGNOSIS — T63441A Toxic effect of venom of bees, accidental (unintentional), initial encounter: Secondary | ICD-10-CM | POA: Insufficient documentation

## 2022-08-02 DIAGNOSIS — I10 Essential (primary) hypertension: Secondary | ICD-10-CM | POA: Insufficient documentation

## 2022-08-02 DIAGNOSIS — T7840XA Allergy, unspecified, initial encounter: Secondary | ICD-10-CM

## 2022-08-02 DIAGNOSIS — E119 Type 2 diabetes mellitus without complications: Secondary | ICD-10-CM | POA: Diagnosis not present

## 2022-08-02 DIAGNOSIS — T782XXA Anaphylactic shock, unspecified, initial encounter: Secondary | ICD-10-CM

## 2022-08-02 MED ORDER — DIPHENHYDRAMINE HCL 50 MG/ML IJ SOLN
INTRAMUSCULAR | Status: AC
  Filled 2022-08-02: qty 1

## 2022-08-02 MED ORDER — EPINEPHRINE 0.3 MG/0.3ML IJ SOAJ: 0.3000 mg | INTRAMUSCULAR | 0 refills | Status: DC | PRN

## 2022-08-02 MED ORDER — LACTATED RINGERS IV BOLUS: 1000.0000 mL | Freq: Once | INTRAVENOUS | Status: DC

## 2022-08-02 MED ORDER — EPINEPHRINE PF 1 MG/ML IJ SOLN
0.3000 mg | Freq: Once | INTRAMUSCULAR | Status: AC
  Administered 2022-08-02: 0.3 mg via INTRAMUSCULAR

## 2022-08-02 MED ORDER — DIPHENHYDRAMINE HCL 50 MG/ML IJ SOLN
12.5000 mg | Freq: Once | INTRAMUSCULAR | Status: AC
  Administered 2022-08-02: 12.5 mg via INTRAVENOUS

## 2022-08-02 MED ORDER — DIPHENHYDRAMINE HCL 50 MG/ML IJ SOLN
50.0000 mg | Freq: Once | INTRAMUSCULAR | Status: AC
  Administered 2022-08-02: 50 mg via INTRAVENOUS
  Filled 2022-08-02: qty 1

## 2022-08-02 MED ORDER — METHYLPREDNISOLONE SODIUM SUCC 125 MG IJ SOLR
125.0000 mg | Freq: Once | INTRAMUSCULAR | Status: AC
  Administered 2022-08-02: 125 mg via INTRAMUSCULAR

## 2022-08-02 MED ORDER — METHYLPREDNISOLONE SODIUM SUCC 125 MG IJ SOLR
INTRAMUSCULAR | Status: AC
  Filled 2022-08-02: qty 2

## 2022-08-02 MED ORDER — EPINEPHRINE 0.3 MG/0.3ML IJ SOAJ
INTRAMUSCULAR | Status: AC
  Filled 2022-08-02: qty 0.3

## 2022-08-02 MED ORDER — SODIUM CHLORIDE 0.9 % IV BOLUS
1000.0000 mL | Freq: Once | INTRAVENOUS | Status: AC
  Administered 2022-08-02: 1000 mL via INTRAVENOUS

## 2022-08-02 NOTE — ED Provider Notes (Signed)
[facility]    CSN: [id number] Arrival date & time: 08/02/22  1907      History   Chief Complaint Chief Complaint  Patient presents with   Allergic Reaction    HPI Anna Cooper is a 72 y.o. female.   Patient presents to clinic after being stung by multiple flying insects just prior to arrival.  She was out mowing the lawn, there was a nest of wasps / flying insects in the yard. She got stung multple times. She vomited multiple times and has been having nausea. She has systemic hives. Able to speak in full sentences.   Reports NKA, NKDA.   The history is provided by the patient, medical records and the spouse.  Allergic Reaction Presenting symptoms: rash     Past Medical History:  Diagnosis Date   Fuchs' corneal dystrophy    s/p  bilateral corneal transplant at [facility] -- right 09-24-2011 and left 02-11-2012   History of recurrent UTIs    History of venereal warts    Hyperlipidemia    Hypertension    Infantile paralysis    Major depression    PMB (postmenopausal bleeding)    S/P removal of thyroid nodule 01/14/2017   Type 2 diabetes mellitus (HCC) 03/31/2016   not on meds currently    Patient Active Problem List   Diagnosis Date Noted   Tinnitus of both ears 12/15/2020   Bilateral hearing loss 12/15/2020   Elevated alkaline phosphatase level 11/06/2020   Osteopenia 10/13/2020   At high risk for breast cancer 10/05/2018   Thyroid mass 01/03/2017   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   Genetic testing 08/30/2016   Fuchs' corneal dystrophy 07/15/2012   Pseudophakia of both eyes 07/15/2012   Routine health maintenance 12/08/2010   Type 2 diabetes with complication (HCC) 11/25/2008   HSV infection 03/10/2007   Hyperlipidemia associated with type 2 diabetes mellitus (HCC) 03/10/2007   Hypertension 03/10/2007    Past Surgical History:  Procedure Laterality Date   BREAST BIOPSY Right 11/10/2020   BREAST BIOPSY Left 2007   BUNIONECTOMY  WITH HAMMERTOE RECONSTRUCTION Bilateral right 07/ 2013;  left 11/ 2013   CATARACT EXTRACTION W/ INTRAOCULAR LENS  IMPLANT, BILATERAL  right 09-24-2011;  left 02-11-2012   [facility]   and both eye had corneal transplant DSAEK at same time cataract extraction   COLONOSCOPY     DILATATION & CURETTAGE/HYSTEROSCOPY WITH MYOSURE N/A 06/24/2016   Procedure: DILATATION & CURETTAGE/HYSTEROSCOPY WITH MYOSURE;  Surgeon: Ranae Pila, MD;  Location: [facility];  Service: Gynecology;  Laterality: N/A;   TUBAL LIGATION  1980's    OB History   No obstetric history on file.      Home Medications    Prior to Admission medications   Medication Sig Start Date End Date Taking? Authorizing Provider  amLODipine (NORVASC) 5 MG tablet Take 1 tablet (5 mg total) by mouth daily. 05/10/22   Myrlene Broker, MD  Cholecalciferol (VITAMIN D3) 1000 UNITS CAPS Take 1 capsule by mouth daily.     [provider]  ezetimibe (ZETIA) 10 MG tablet Take 1 tablet (10 mg total) by mouth daily. 05/10/22   Myrlene Broker, MD  fluorometholone (FML) 0.1 % ophthalmic suspension Place 1 drop into both eyes daily.     [provider]  Glucosamine-Chondroit-Vit C-Mn (GLUCOSAMINE 1500 COMPLEX PO) Take 1,500 mg by mouth 2 (two) times daily.    [provider]  hydrochlorothiazide (MICROZIDE) 12.5 MG capsule  Take 1 capsule (12.5 mg total) by mouth daily. 05/10/22   Myrlene Broker, MD  metFORMIN (GLUCOPHAGE-XR) 500 MG 24 hr tablet Take 500 mg by mouth daily.    [provider]  Multiple Vitamins-Minerals (CENTRUM SILVER ADULT 50+ PO) Take 1 tablet by mouth daily.     [provider]  potassium chloride (KLOR-CON M) 10 MEQ tablet Take 1 tablet (10 mEq total) by mouth daily. 05/10/22   Myrlene Broker, MD  simvastatin (ZOCOR) 20 MG tablet Take 1 tablet (20 mg total) by mouth daily. 05/10/22   Myrlene Broker, MD  terbinafine (LAMISIL) 250 MG tablet  Take 1 tablet (250 mg total) by mouth daily. 06/03/22   Vivi Barrack, DPM  potassium chloride (K-DUR) 10 MEQ tablet Take 1 tablet (10 mEq total) by mouth daily. 12/31/11 01/08/13  Norins, Rosalyn Gess, MD    Family History Family History  Problem Relation Age of Onset   Diabetes Mother    Breast cancer Mother 74       dx late 70's, was in remission but had relapse with mets. Died at 78   Heart attack Father    Seizures Father    Heart disease Father        CAD/MI-fatal   Breast cancer Sister 67       dx early 3's, BRCA2+, now is 48   Breast cancer Sister 23       dx. in 40's, BRCA2-,    Cancer Maternal Grandfather        Lung Cancer   Breast cancer Maternal Grandfather    Prostate cancer Maternal Uncle 60       dx and died in his 34's   Colon cancer Maternal Aunt 36       dx in 52's, had 12 in of colon removed, died in her 37's   Alzheimer's disease Paternal Aunt    Breast cancer Other 35       BRCA2 status unk (did not share result with famiy)   Kidney cancer Other 35       dx in 1's, is no in his 23's   Breast cancer Cousin 92   Pancreatic cancer Cousin 61       died in 6's   Lung cancer Maternal Uncle 31       dx 60's/70's, now is 83   Stomach cancer Cousin 40       died at 80   Cancer Cousin 58       dx and died in 57's. type of cancer unk   Throat cancer Cousin 65       died in 18's   Cancer Cousin        dx age unk, died in 46's/60's. type of CA unk   Cancer Cousin        dx age unk, died in 38's. type of CA unk   Cancer Cousin        age dx unk, died in 48's    Social History Social History   Tobacco Use   Smoking status: Never   Smokeless tobacco: Never  Vaping Use   Vaping status: Never Used  Substance Use Topics   Alcohol use: No   Drug use: No     Allergies   Patient has no known allergies.   Review of Systems Review of Systems  Skin:  Positive for rash.     Physical Exam Triage Vital Signs ED Triage Vitals  Encounter Vitals  Group     BP      Systolic BP Percentile      Diastolic BP Percentile      Pulse      Resp      Temp      Temp src      SpO2      Weight      Height      Head Circumference      Peak Flow      Pain Score      Pain Loc      Pain Education      Exclude from Growth Chart    No data found.  Updated Vital Signs BP (!) 89/69 (BP Location: Right Arm)   Pulse 83   SpO2 100%   Visual Acuity Right Eye Distance:   Left Eye Distance:   Bilateral Distance:    Right Eye Near:   Left Eye Near:    Bilateral Near:     Physical Exam Vitals and nursing note reviewed.  Constitutional:      Appearance: Normal appearance.  HENT:     Head: Normocephalic and atraumatic.     Right Ear: External ear normal.     Left Ear: External ear normal.     Nose: Nose normal.     Mouth/Throat:     Mouth: Mucous membranes are moist.  Eyes:     Conjunctiva/sclera: Conjunctivae normal.  Cardiovascular:     Rate and Rhythm: Normal rate and regular rhythm.     Heart sounds: Normal heart sounds. No murmur heard. Pulmonary:     Effort: Pulmonary effort is normal. No respiratory distress.     Breath sounds: Normal breath sounds.  Skin:    General: Skin is warm and dry.     Findings: Rash present.  Neurological:     General: No focal deficit present.     Mental Status: She is alert.  Psychiatric:        Mood and Affect: Mood normal.      UC Treatments / Results  Labs (all labs ordered are listed, but only abnormal results are displayed) Labs Reviewed - No data to display  EKG   Radiology No results found.  Procedures Procedures (including critical care time)  Medications Ordered in UC Medications  EPINEPHrine (ADRENALIN) 0.3 mg (0.3 mg Intramuscular Given 08/02/22 1931)  diphenhydrAMINE (BENADRYL) injection 12.5 mg (12.5 mg Intravenous Given 08/02/22 1946)  methylPREDNISolone sodium succinate (SOLU-MEDROL) 125 mg/2 mL injection 125 mg (125 mg Intramuscular Given 08/02/22 1957)   sodium chloride 0.9 % bolus 1,000 mL (1,000 mLs Intravenous New Bag/Given 08/02/22 1959)    Initial Impression / Assessment and Plan / UC Course  I have reviewed the triage vital signs and the nursing notes.  Pertinent labs & imaging results that were available during my care of the patient were reviewed by me and considered in my medical decision making (see chart for details).  Vitals and triage reviewed, concern for anaphylaxis due to systemic hives, nausea, emesis after multiple insect stings.  Lungs vesicular, no oral edema or angioedema present. Given IM epinephrine in clinic, peripheral IV started. IV Benadryl and methylprednisolone given with a saline bolus through 20 G PIV in RAC. [facility] called for transport to [facility] ED for further care.      Final Clinical Impressions(s) / UC Diagnoses   Final diagnoses:  Anaphylaxis, initial encounter  Allergic reaction, initial encounter   Discharge Instructions   None    ED  Prescriptions   None    PDMP not reviewed this encounter.   Primitivo Merkey, [place] N, [place] 08/02/22 2003

## 2022-08-02 NOTE — ED Triage Notes (Addendum)
Pt coming in for multiple yellow jacket sting to her hands and legs that caused her some SHOB nausea. No known x of an allergy to bee stings but does have hives generally on her body. Went to UC and was given 125mg  solumedrol, 25mg  benadryl, and 0.3 epi given IM.

## 2022-08-02 NOTE — ED Notes (Signed)
Patient is being discharged from the Urgent Care and sent to the Emergency Department via [place] g. NP. Per [facility], patient is in need of higher level of care due to higher laevel of care. Patient is aware and verbalizes understanding of plan of care.  Vitals:   08/02/22 1938 08/02/22 1939  BP: (!) 68/49 (!) 89/69  Pulse: 83   SpO2: 100%

## 2022-08-02 NOTE — ED Provider Notes (Signed)
[place] EMERGENCY DEPARTMENT AT [facility] Provider Note  MDM   HPI/ROS:  Anna Cooper is a 72 y.o. female with a medical history as below who presents for evaluation after multiple yellowjacket stings.  She reports that she was stung multiple times by a flying insect.  She went to urgent care where she was evaluated and thought to be in anaphylaxis.  He was given epi, Solu-Medrol, and 12.5 of Benadryl.  She was sent here for further evaluation.  She does report that she had difficulty breathing, GI symptoms in the form of nausea/vomiting and a rash.  She reports that she has been stung prior but has never been diagnosed with an anaphylactic reaction.  Physical exam is notable for: - Diffuse urticarial rash - Lungs CTAB  On my initial evaluation, patient is:  -Vital signs stable. Patient afebrile, hemodynamically stable, and non-toxic appearing. -Additional history obtained from chart review  This patient's current presentation, including their history and physical exam, is most consistent with anaphylaxis. Onset of symptoms around 1900. She has already received the appropriate medications including epi and solumedrol. Will need a larger benadryl dose, which I'll order. She reports her GI symptoms have improved since administration of epi and benadryl. Will observe until approximately 11pm. Serial re-evaluation.   Interpretations, interventions, and the patient's course of care are documented below.    Clinical Course as of 08/02/22 2257  Fri Aug 02, 2022  2253 Patient reassessed without significant clinical change.  [BB]    Clinical Course User Index [BB] Fayrene Helper, MD      Disposition:  I discussed the plan for discharge with the patient and/or their surrogate at bedside prior to discharge and they were in agreement with the plan and verbalized understanding of the return precautions provided. All questions answered to the best of my ability. Ultimately, the  patient was discharged in stable condition with stable vital signs. I am reassured that they are capable of close follow up and good social support at home.   Clinical Impression:  1. Anaphylaxis, initial encounter     Rx / DC Orders ED Discharge Orders          Ordered    EPINEPHrine 0.3 mg/0.3 mL IJ SOAJ injection  As needed        08/02/22 2224            The plan for this patient was discussed with Dr. Dalene Seltzer, who voiced agreement and who oversaw evaluation and treatment of this patient.   Clinical Complexity A medically appropriate history, review of systems, and physical exam was performed.  My independent interpretations of EKG, labs, and radiology are documented in the ED course above.   If decision rules were used in this patient's evaluation, they are listed below.   Click here for ABCD2, HEART and other calculatorsREFRESH Note before signing   Patient's presentation is most consistent with acute presentation with potential threat to life or bodily function.  Medical Decision Making Risk Prescription drug management.    HPI/ROS      See MDM section for pertinent HPI and ROS. A complete ROS was performed with pertinent positives/negatives noted above.   Past Medical History:  Diagnosis Date   Marice Potter' corneal dystrophy    s/p  bilateral corneal transplant at [facility] -- right 09-24-2011 and left 02-11-2012   History of recurrent UTIs    History of venereal warts    Hyperlipidemia    Hypertension    Infantile paralysis  Major depression    PMB (postmenopausal bleeding)    S/P removal of thyroid nodule 01/14/2017   Type 2 diabetes mellitus (HCC) 03/31/2016   not on meds currently    Past Surgical History:  Procedure Laterality Date   BREAST BIOPSY Right 11/10/2020   BREAST BIOPSY Left 2007   BUNIONECTOMY WITH HAMMERTOE RECONSTRUCTION Bilateral right 07/ 2013;  left 11/ 2013   CATARACT EXTRACTION W/ INTRAOCULAR LENS  IMPLANT, BILATERAL  right  09-24-2011;  left 02-11-2012   [facility]   and both eye had corneal transplant DSAEK at same time cataract extraction   COLONOSCOPY     DILATATION & CURETTAGE/HYSTEROSCOPY WITH MYOSURE N/A 06/24/2016   Procedure: DILATATION & CURETTAGE/HYSTEROSCOPY WITH MYOSURE;  Surgeon: Ranae Pila, MD;  Location: [facility];  Service: Gynecology;  Laterality: N/A;   TUBAL LIGATION  1980's      Physical Exam   Vitals:   08/02/22 2016 08/02/22 2046 08/02/22 2135 08/02/22 2248  BP: (!) 110/54 (!) 106/43 (!) 108/45 (!) 107/51  Pulse: 82 90 91 90  Resp: 20 (!) 21 20 18   Temp: 98.1 F (36.7 C)     SpO2: 100% 100% 96% 98%    Physical Exam Vitals and nursing note reviewed.  Constitutional:      General: She is not in acute distress.    Appearance: She is well-developed.  HENT:     Head: Normocephalic and atraumatic.  Eyes:     Extraocular Movements: Extraocular movements intact.     Conjunctiva/sclera: Conjunctivae normal.     Pupils: Pupils are equal, round, and reactive to light.  Cardiovascular:     Rate and Rhythm: Normal rate and regular rhythm.     Heart sounds: No murmur heard. Pulmonary:     Effort: Pulmonary effort is normal. No respiratory distress.     Breath sounds: Normal breath sounds. No decreased breath sounds, wheezing, rhonchi or rales.  Abdominal:     Palpations: Abdomen is soft.     Tenderness: There is no abdominal tenderness. There is no guarding or rebound.  Musculoskeletal:        General: No swelling.     Cervical back: Neck supple.  Skin:    General: Skin is warm and dry.     Capillary Refill: Capillary refill takes less than 2 seconds.     Findings: Rash present. Rash is urticarial.  Neurological:     General: No focal deficit present.     Mental Status: She is alert and oriented to person, place, and time.  Psychiatric:        Mood and Affect: Mood normal.      Procedures   If procedures were preformed on this patient, they are  listed below:  Procedures   Fayrene Helper, MD Emergency Medicine PGY-2   Please note that this documentation was produced with the assistance of voice-to-text technology and may contain errors.

## 2022-08-02 NOTE — ED Triage Notes (Signed)
Pt states she was stung by yellow jackets multiple times about 2 hours ago and she has SOB hives, vomiting and diarrhea.  In office pt is unable to hold a conversation and provider was called to bedside. EPI given at 7:31pm.

## 2022-08-03 MED ORDER — EPINEPHRINE 0.3 MG/0.3ML IJ SOAJ: 0.3000 mg | INTRAMUSCULAR | 0 refills | Status: AC | PRN

## 2022-08-03 MED ORDER — PREDNISONE 10 MG PO TABS: 30.0000 mg | ORAL_TABLET | Freq: Every day | ORAL | 0 refills | Status: DC

## 2022-08-06 ENCOUNTER — Encounter: Payer: Self-pay | Admitting: Internal Medicine

## 2022-08-06 ENCOUNTER — Ambulatory Visit: Payer: Medicare PPO | Admitting: Internal Medicine

## 2022-08-06 VITALS — BP 124/76 | HR 56 | Temp 98.8°F | Ht 65.0 in | Wt 170.0 lb

## 2022-08-06 DIAGNOSIS — Z91030 Bee allergy status: Secondary | ICD-10-CM

## 2022-08-06 DIAGNOSIS — I10 Essential (primary) hypertension: Secondary | ICD-10-CM | POA: Diagnosis not present

## 2022-08-06 DIAGNOSIS — Z7984 Long term (current) use of oral hypoglycemic drugs: Secondary | ICD-10-CM

## 2022-08-06 DIAGNOSIS — E118 Type 2 diabetes mellitus with unspecified complications: Secondary | ICD-10-CM | POA: Diagnosis not present

## 2022-08-06 LAB — POCT GLYCOSYLATED HEMOGLOBIN (HGB A1C): Hemoglobin A1C: 6.4 % — AB (ref 4.0–5.6)

## 2022-08-06 MED ORDER — TRIAMCINOLONE ACETONIDE 0.1 % EX CREA: 1.0000 | TOPICAL_CREAM | Freq: Two times a day (BID) | CUTANEOUS | 0 refills | Status: DC

## 2022-08-06 MED ORDER — PREDNISONE 10 MG PO TABS: ORAL_TABLET | ORAL | 0 refills | Status: DC

## 2022-08-06 NOTE — Assessment & Plan Note (Addendum)
Lab Results  Component Value Date   HGBA1C 6.4 (A) 08/06/2022   Stable, pt to continue currrent medical treatment metformin ER 500 every day, pt endo in [place] office but asks for referral different endo within the [facility] and epic

## 2022-08-06 NOTE — Assessment & Plan Note (Signed)
With some hive lesions persistent - for further prednisone taper, triam cr prn sting lesions, benadryl prn

## 2022-08-06 NOTE — Assessment & Plan Note (Signed)
BP Readings from Last 3 Encounters:  08/06/22 124/76  08/03/22 (!) 106/59  08/02/22 (!) 89/69   Stable, pt to continue medical treatment norvasc 5 mg qd

## 2022-08-06 NOTE — Progress Notes (Signed)
Patient ID: Anna Cooper, female   DOB: [birth year], 72 y.o.   MRN: [id number]        Chief Complaint: follow up recent anaphylaxis       HPI:  Anna Cooper is a 72 y.o. female here with c/o initial episode yellow jacket sting about 3 wks ago, then f/u accidental sting episode july19 with immediate severe anaphylaxis episode with n/v/d, low BP tx with epinephrine emergently, IVFs, IV steroid and antihistamine and fortunately did well.  Was able for d/c 3 days ago and finished 3 days po prednisone, but unfortunately still with itching, few hive lesions persistent and 5 areas with persisitent erythema from stings - 3 to legs, 2 to hands.  Pt denies chest pain, increased sob or doe, wheezing, orthopnea, PND, increased LE swelling, palpitations, dizziness or syncope.   Pt denies polydipsia, polyuria, or new focal neuro s/s.      Wt Readings from Last 3 Encounters:  08/06/22 170 lb (77.1 kg)  02/21/22 172 lb (78 kg)  12/21/21 167 lb (75.8 kg)   BP Readings from Last 3 Encounters:  08/06/22 124/76  08/03/22 (!) 106/59  08/02/22 (!) 89/69         Past Medical History:  Diagnosis Date   Fuchs' corneal dystrophy    s/p  bilateral corneal transplant at [facility] -- right 09-24-2011 and left 02-11-2012   History of recurrent UTIs    History of venereal warts    Hyperlipidemia    Hypertension    Infantile paralysis    Major depression    PMB (postmenopausal bleeding)    S/P removal of thyroid nodule 01/14/2017   Type 2 diabetes mellitus (HCC) 03/31/2016   not on meds currently   Past Surgical History:  Procedure Laterality Date   BREAST BIOPSY Right 11/10/2020   BREAST BIOPSY Left 2007   BUNIONECTOMY WITH HAMMERTOE RECONSTRUCTION Bilateral right 07/ 2013;  left 11/ 2013   CATARACT EXTRACTION W/ INTRAOCULAR LENS  IMPLANT, BILATERAL  right 09-24-2011;  left 02-11-2012   [facility]   and [facility] eye had corneal transplant DSAEK at same time cataract extraction   COLONOSCOPY     DILATATION &  CURETTAGE/HYSTEROSCOPY WITH MYOSURE N/A 06/24/2016   Procedure: DILATATION & CURETTAGE/HYSTEROSCOPY WITH MYOSURE;  Surgeon: Ranae Pila, MD;  Location: [facility];  Service: Gynecology;  Laterality: N/A;   TUBAL LIGATION  1980's    reports that she has never smoked. She has never used smokeless tobacco. She reports that she does not drink alcohol and does not use drugs. family history includes Alzheimer's disease in her paternal aunt; Breast cancer in her maternal grandfather; Breast cancer (age of onset: 97) in an other family member; Breast cancer (age of onset: 41) in her sister; Breast cancer (age of onset: 52) in her cousin; Breast cancer (age of onset: 38) in her sister; Breast cancer (age of onset: 85) in her mother; Cancer in her cousin, cousin, cousin, and maternal grandfather; Cancer (age of onset: 43) in her cousin; Colon cancer (age of onset: 14) in her maternal aunt; Diabetes in her mother; Heart attack in her father; Heart disease in her father; Kidney cancer (age of onset: 20) in an other family member; Lung cancer (age of onset: 55) in her maternal uncle; Pancreatic cancer (age of onset: 46) in her cousin; Prostate cancer (age of onset: 44) in her maternal uncle; Seizures in her father; Stomach cancer (age of onset: 24) in her cousin; Throat cancer (age of onset:  65) in her cousin. Allergies  Allergen Reactions   Bee Venom Anaphylaxis   Current Outpatient Medications on File Prior to Visit  Medication Sig Dispense Refill   amLODipine (NORVASC) 5 MG tablet Take 1 tablet (5 mg total) by mouth daily. 90 tablet 3   Cholecalciferol (VITAMIN D3) 1000 UNITS CAPS Take 1 capsule by mouth daily.      EPINEPHrine 0.3 mg/0.3 mL IJ SOAJ injection Inject 0.3 mg into the muscle as needed for anaphylaxis. 1 each 0   ezetimibe (ZETIA) 10 MG tablet Take 1 tablet (10 mg total) by mouth daily. 90 tablet 3   fluorometholone (FML) 0.1 % ophthalmic suspension Place 1 drop into both  eyes daily.      Glucosamine-Chondroit-Vit C-Mn (GLUCOSAMINE 1500 COMPLEX PO) Take 1,500 mg by mouth 2 (two) times daily.     hydrochlorothiazide (MICROZIDE) 12.5 MG capsule Take 1 capsule (12.5 mg total) by mouth daily. 90 capsule 3   metFORMIN (GLUCOPHAGE-XR) 500 MG 24 hr tablet Take 500 mg by mouth daily.     Multiple Vitamins-Minerals (CENTRUM SILVER ADULT 50+ PO) Take 1 tablet by mouth daily.      potassium chloride (KLOR-CON M) 10 MEQ tablet Take 1 tablet (10 mEq total) by mouth daily. 90 tablet 3   simvastatin (ZOCOR) 20 MG tablet Take 1 tablet (20 mg total) by mouth daily. 90 tablet 3   terbinafine (LAMISIL) 250 MG tablet Take 1 tablet (250 mg total) by mouth daily. 90 tablet 0   [DISCONTINUED] potassium chloride (K-DUR) 10 MEQ tablet Take 1 tablet (10 mEq total) by mouth daily. 30 tablet 11   No current facility-administered medications on file prior to visit.        ROS:  All others reviewed and negative.  Objective        PE:  BP 124/76 (BP Location: Right Arm, Patient Position: Sitting, Cuff Size: Normal)   Pulse (!) 56   Temp 98.8 F (37.1 C) (Oral)   Ht 5\' 5"  (1.651 m)   Wt 170 lb (77.1 kg)   SpO2 98%   BMI 28.29 kg/m                 Constitutional: Pt appears in NAD               HENT: Head: NCAT.                Right Ear: External ear normal.                 Left Ear: External ear normal.                Eyes: . Pupils are equal, round, and reactive to light. Conjunctivae and EOM are normal               Nose: without d/c or deformity               Neck: Neck supple. Gross normal ROM               Cardiovascular: Normal rate and regular rhythm.                 Pulmonary/Chest: Effort normal and breath sounds without rales or wheezing.                Abd:  Soft, NT, ND, + BS, no organomegaly               Neurological: Pt is alert. At baseline orientation,  motor grossly intact               Skin: Skin is warm. LE edema - none, few hive lesion s persist to torso and  arms, also 5 distinct erythema area of stings               Psychiatric: Pt behavior is normal without agitation   Micro: none  Cardiac tracings I have personally interpreted today:  none  Pertinent Radiological findings (summarize): none   Lab Results  Component Value Date   WBC 6.1 07/19/2022   HGB 14.6 07/19/2022   HCT 45.9 07/19/2022   PLT 360 07/19/2022   GLUCOSE 126 (H) 12/21/2021   CHOL 115 12/21/2021   TRIG 65.0 12/21/2021   HDL 46.10 12/21/2021   LDLCALC 56 12/21/2021   ALT 19 07/19/2022   AST 22 07/19/2022   NA 140 12/21/2021   K 3.7 12/21/2021   CL 103 12/21/2021   CREATININE 0.67 12/21/2021   BUN 14 12/21/2021   CO2 28 12/21/2021   TSH 0.96 10/23/2018   HGBA1C 6.4 (A) 08/06/2022   MICROALBUR <0.7 12/21/2021   Hemoglobin A1C 4.0 - 5.6 % 6.4 Abnormal  7.3 High  R, CM 7.2 High     Assessment/Plan:  Anna Cooper is a 72 y.o. Black or African American [2] female with  has a past medical history of Fuchs' corneal dystrophy, History of recurrent UTIs, History of venereal warts, Hyperlipidemia, Hypertension, Infantile paralysis, Major depression, PMB (postmenopausal bleeding), S/P removal of thyroid nodule (01/14/2017), and Type 2 diabetes mellitus (HCC) (03/31/2016).  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 With some hive lesions persistent - for further prednisone taper, triam cr prn sting lesions, benadryl prn  Hypertension BP Readings from Last 3 Encounters:  08/06/22 124/76  08/03/22 (!) 106/59  08/02/22 (!) 89/69   Stable, pt to continue medical treatment norvasc 5 mg qd   Type 2 diabetes with complication (HCC) Lab Results  Component Value Date   HGBA1C 6.4 (A) 08/06/2022   Stable, pt to continue currrent medical treatment metformin ER 500 every day, pt endo in [place] office but asks for referral different endo within the [facility] and epic  Followup: Return if symptoms worsen or fail to improve.  Oliver Barre, MD 08/06/2022 8:30 PM [facility] Internal Medicine

## 2022-08-06 NOTE — Patient Instructions (Signed)
Your A1c was done today  Please take all new medication as prescribed - the further lower dose and longer prednisone, and the cream  Please continue all other medications as before, and refills have been done if requested.  Please have the pharmacy call with any other refills you may need.  Please continue your efforts at being more active, low cholesterol diet, and weight control.  You are otherwise up to date with prevention measures today.  Please keep your appointments with your specialists as you may have planned  You will be contacted regarding the referral for: Endo within [facility]

## 2022-08-08 ENCOUNTER — Telehealth: Payer: Self-pay

## 2022-08-08 NOTE — Telephone Encounter (Signed)
Transition Care Management Unsuccessful Follow-up Telephone Call  Date of discharge and from where: [facility]   Attempts:  1st Attempt  Reason for unsuccessful TCM follow-up call:  No answer/busy   Lenard Forth [facility] Guide, [facility] [phone] [address] [place], [place], [place] [address] Phone: [phone] Email: [email or web address]

## 2022-08-09 ENCOUNTER — Telehealth: Payer: Self-pay

## 2022-08-09 NOTE — Telephone Encounter (Signed)
Transition Care Management Follow-up Telephone Call Date of discharge and from where: Anna Cooper 7/20 How have you been since you were released from the hospital? Doing OK Any questions or concerns? No  Items Reviewed: Did the pt receive and understand the discharge instructions provided? Yes  Medications obtained and verified? Yes  Other? No  Any new allergies since your discharge?  Dietary orders reviewed? No Do you have support at home? Yes    Follow up appointments reviewed:  PCP Hospital f/u appt confirmed? Yes  Scheduled to see PCP on 7/30 @ . Specialist Hospital f/u appt confirmed? No  Scheduled to see  on  @ . Are transportation arrangements needed? No  If their condition worsens, is the pt aware to call PCP or go to the Emergency Dept.? Yes Was the patient provided with contact information for the PCP's office or ED? Yes Was to pt encouraged to call back with questions or concerns? Yes

## 2022-08-15 ENCOUNTER — Telehealth: Payer: Self-pay | Admitting: Podiatry

## 2022-08-15 NOTE — Telephone Encounter (Signed)
Pt came in wanting to know when to stop taking her anti fungal medication and if should take get her blood work done before she stops taking the medication

## 2022-08-19 DIAGNOSIS — R899 Unspecified abnormal finding in specimens from other organs, systems and tissues: Secondary | ICD-10-CM | POA: Diagnosis not present

## 2022-08-20 ENCOUNTER — Other Ambulatory Visit: Payer: Self-pay | Admitting: Podiatry

## 2022-08-20 MED ORDER — CICLOPIROX 8 % EX SOLN: Freq: Every day | CUTANEOUS | 2 refills | Status: AC

## 2022-08-22 ENCOUNTER — Other Ambulatory Visit: Payer: Self-pay | Admitting: Podiatry

## 2022-09-04 DIAGNOSIS — H40053 Ocular hypertension, bilateral: Secondary | ICD-10-CM | POA: Diagnosis not present

## 2022-09-04 DIAGNOSIS — Z0100 Encounter for examination of eyes and vision without abnormal findings: Secondary | ICD-10-CM | POA: Diagnosis not present

## 2022-09-04 DIAGNOSIS — E119 Type 2 diabetes mellitus without complications: Secondary | ICD-10-CM | POA: Diagnosis not present

## 2022-09-05 ENCOUNTER — Ambulatory Visit: Payer: Medicare PPO | Admitting: Podiatry

## 2022-09-05 DIAGNOSIS — B351 Tinea unguium: Secondary | ICD-10-CM

## 2022-09-05 DIAGNOSIS — Z79899 Other long term (current) drug therapy: Secondary | ICD-10-CM

## 2022-09-05 MED ORDER — FLUCONAZOLE 150 MG PO TABS: 150.0000 mg | ORAL_TABLET | ORAL | 0 refills | Status: DC

## 2022-09-09 NOTE — Progress Notes (Signed)
Subjective: Chief Complaint  Patient presents with   Follow-up    3 month follow up nail fungus. No changes to her nails.     72 year old female presents the office for follow-up evaluation of nail fungus.  She has been using the topical medication.  Things are getting a little bit better.  No swelling or redness or drainage.  Objective: AAO x3, NAD DP/PT pulses palpable bilaterally, CRT less than 3 seconds There does appear to be some mild clearing on the proximal nail folds however not significant compared to previous.  No edema, erythema. No open lesions. No pain with calf compression, swelling, warmth, erythema  Assessment: Onychomycosis  Plan: -All treatment options discussed with the patient including all alternatives, risks, complications.  -Sharply debrided the nails x 10 without any complications or bleeding.  She can continue topical medication.  Will also going to add oral fluconazole weekly.  Will recheck blood work in 4 weeks.  Order given and she is aware to get this done.  Monitoring side effects.  Return in about 3 months (around 12/06/2022) for nail fungus.  Vivi Barrack DPM

## 2022-11-04 ENCOUNTER — Other Ambulatory Visit: Payer: Self-pay | Admitting: Nurse Practitioner

## 2022-11-04 DIAGNOSIS — E042 Nontoxic multinodular goiter: Secondary | ICD-10-CM | POA: Diagnosis not present

## 2022-11-04 DIAGNOSIS — I10 Essential (primary) hypertension: Secondary | ICD-10-CM | POA: Diagnosis not present

## 2022-11-04 DIAGNOSIS — K760 Fatty (change of) liver, not elsewhere classified: Secondary | ICD-10-CM | POA: Diagnosis not present

## 2022-11-04 DIAGNOSIS — E119 Type 2 diabetes mellitus without complications: Secondary | ICD-10-CM | POA: Diagnosis not present

## 2022-11-05 DIAGNOSIS — R32 Unspecified urinary incontinence: Secondary | ICD-10-CM | POA: Diagnosis not present

## 2022-11-05 DIAGNOSIS — Z6824 Body mass index (BMI) 24.0-24.9, adult: Secondary | ICD-10-CM | POA: Diagnosis not present

## 2022-11-05 DIAGNOSIS — Z1151 Encounter for screening for human papillomavirus (HPV): Secondary | ICD-10-CM | POA: Diagnosis not present

## 2022-11-05 DIAGNOSIS — Z9189 Other specified personal risk factors, not elsewhere classified: Secondary | ICD-10-CM | POA: Diagnosis not present

## 2022-11-05 DIAGNOSIS — Z124 Encounter for screening for malignant neoplasm of cervix: Secondary | ICD-10-CM | POA: Diagnosis not present

## 2022-11-06 ENCOUNTER — Ambulatory Visit
Admission: RE | Admit: 2022-11-06 | Discharge: 2022-11-06 | Disposition: A | Discharge status: Home / Self Care | Payer: Medicare PPO | Source: Ambulatory Visit | Attending: Nurse Practitioner | Admitting: Nurse Practitioner

## 2022-11-06 ENCOUNTER — Other Ambulatory Visit: Payer: Self-pay | Admitting: Obstetrics and Gynecology

## 2022-11-06 DIAGNOSIS — E042 Nontoxic multinodular goiter: Secondary | ICD-10-CM | POA: Diagnosis not present

## 2022-11-06 DIAGNOSIS — Z23 Encounter for immunization: Secondary | ICD-10-CM | POA: Diagnosis not present

## 2022-11-06 DIAGNOSIS — Z9189 Other specified personal risk factors, not elsewhere classified: Secondary | ICD-10-CM

## 2022-11-12 ENCOUNTER — Other Ambulatory Visit (INDEPENDENT_AMBULATORY_CARE_PROVIDER_SITE_OTHER): Payer: Medicare PPO

## 2022-11-12 ENCOUNTER — Ambulatory Visit: Payer: Medicare PPO | Admitting: Orthopaedic Surgery

## 2022-11-12 DIAGNOSIS — M79601 Pain in right arm: Secondary | ICD-10-CM

## 2022-11-12 MED ORDER — BUPIVACAINE HCL 0.5 % IJ SOLN
3.0000 mL | INTRAMUSCULAR | Status: AC | PRN
Start: 2022-11-12 — End: 2022-11-12
  Administered 2022-11-12: 3 mL via INTRA_ARTICULAR

## 2022-11-12 MED ORDER — METHYLPREDNISOLONE ACETATE 40 MG/ML IJ SUSP
40.0000 mg | INTRAMUSCULAR | Status: AC | PRN
Start: 2022-11-12 — End: 2022-11-12
  Administered 2022-11-12: 40 mg via INTRA_ARTICULAR

## 2022-11-12 MED ORDER — LIDOCAINE HCL 1 % IJ SOLN
3.0000 mL | INTRAMUSCULAR | Status: AC | PRN
Start: 2022-11-12 — End: 2022-11-12
  Administered 2022-11-12: 3 mL

## 2022-11-12 NOTE — Progress Notes (Signed)
Office Visit Note   Patient: Anna Cooper           Date of Birth: Nov 25, 1950           MRN: [id number] Visit Date: 11/12/2022              Requested by: Anna Broker, MD [address] [place],  [place] [address] PCP: Anna Broker, MD   Assessment & Plan: Visit Diagnoses:  1. Right arm pain     Plan: Anna Cooper is a very pleasant 72 year old female with chronic right shoulder pain.  Impression is rotator cuff tendinosis.  I do not get the sense that it is radicular pain.  I would like to try subacromial injection and some home exercises.  She will return to see me in about 6 weeks if she does not feel any improvement.  Follow-Up Instructions: No follow-ups on file.   Orders:  Orders Placed This Encounter  Procedures   XR Cervical Spine 2 or 3 views   XR Shoulder Right   No orders of the defined types were placed in this encounter.     Procedures: Large Joint Inj: R subacromial bursa on 11/12/2022 10:07 AM Indications: pain Details: 22 G needle  Arthrogram: No  Medications: 3 mL lidocaine 1 %; 3 mL bupivacaine 0.5 %; 40 mg methylPREDNISolone acetate 40 MG/ML Outcome: tolerated well, no immediate complications Consent was given by the patient. Patient was prepped and draped in the usual sterile fashion.       Clinical Data: No additional findings.   Subjective: Chief Complaint  Patient presents with   Right Arm - Pain    HPI Patient is a very pleasant 72 year old female comes in for evaluation of right arm pain for about 10 months that comes and goes.  Has pain mainly to the lateral shoulder that radiates down to the hand.  Denies any radicular symptoms.  She has been using Aleve as needed.  Review of Systems  Constitutional: Negative.   HENT: Negative.    Eyes: Negative.   Respiratory: Negative.    Cardiovascular: Negative.   Endocrine: Negative.   Musculoskeletal: Negative.   Neurological: Negative.   Hematological:  Negative.   Psychiatric/Behavioral: Negative.    All other systems reviewed and are negative.    Objective: Vital Signs: There were no vitals taken for this visit.  Physical Exam Vitals and nursing note reviewed.  Constitutional:      Appearance: She is well-developed.  HENT:     Head: Atraumatic.     Nose: Nose normal.  Eyes:     Extraocular Movements: Extraocular movements intact.  Cardiovascular:     Pulses: Normal pulses.  Pulmonary:     Effort: Pulmonary effort is normal.  Abdominal:     Palpations: Abdomen is soft.  Musculoskeletal:     Cervical back: Neck supple.  Skin:    General: Skin is warm.     Capillary Refill: Capillary refill takes less than 2 seconds.  Neurological:     Mental Status: She is alert. Mental status is at baseline.  Psychiatric:        Behavior: Behavior normal.        Thought Content: Thought content normal.        Judgment: Judgment normal.     Ortho Exam Exam of the right shoulder shows full passive range of motion with minimal pain.  No impingement signs.  She has slight weakness and pain with manual muscle testing of  the supraspinatus. Specialty Comments:  No specialty comments available.  Imaging: XR Shoulder Right  Result Date: 11/12/2022 X-rays of the right shoulder show mild arthritic changes.  No acute or structural abnormalities.  XR Cervical Spine 2 or 3 views  Result Date: 11/12/2022 X-rays of the cervical spine show no acute abnormalities.  She has multilevel degenerative disc disease.    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   Genetic testing 08/30/2016   Fuchs' corneal dystrophy  07/15/2012   Pseudophakia of both eyes 07/15/2012   Routine health maintenance 12/08/2010   Type 2 diabetes with complication (HCC) 11/25/2008   HSV infection 03/10/2007   Hyperlipidemia associated with type 2 diabetes mellitus (HCC) 03/10/2007   Hypertension 03/10/2007   Past Medical History:  Diagnosis Date   Fuchs' corneal dystrophy    s/p  [facility] at [facility] -- right 09-24-2011 and left 02-11-2012   History of recurrent UTIs    History of venereal warts    Hyperlipidemia    Hypertension    Infantile paralysis    Major depression    PMB (postmenopausal bleeding)    S/P removal of thyroid nodule 01/14/2017   Type 2 diabetes mellitus (HCC) 03/31/2016   not on meds currently    Family History  Problem Relation Age of Onset   Diabetes Mother    Breast cancer Mother 63       dx late 45's, was in remission but had relapse with mets. Died at 12   Heart attack Father    Seizures Father    Heart disease Father        CAD/MI-fatal   Breast cancer Sister 15       dx early 76's, BRCA2+, now is 90   Breast cancer Sister 19       dx. in 40's, BRCA2-,    Cancer Maternal Grandfather        Lung Cancer   Breast cancer Maternal Grandfather    Prostate cancer Maternal Uncle 52       dx and died in his 59's   Colon cancer Maternal Aunt 55       dx in 42's, had 12 in of colon removed, died in her 81's   Alzheimer's disease Paternal Aunt    Breast cancer Other 35       BRCA2 status unk (did not share result with famiy)   Kidney cancer Other 35       dx in 40's, is no in his 56's   Breast cancer Cousin 59   Pancreatic cancer Cousin 42       died in 13's   Lung cancer Maternal Uncle 22       dx 60's/70's, now is 24   Stomach cancer Cousin 40       died at 58   Cancer Cousin 68       dx and died in 60's. type of cancer unk   Throat cancer Cousin 65       died in 67's   Cancer Cousin        dx age unk, died in 58's/60's. type of CA unk   Cancer  Cousin         dx age unk, died in 6's. type of CA unk   Cancer Cousin        age dx unk, died in 61's    Past Surgical History:  Procedure Laterality Date   BREAST BIOPSY Right 11/10/2020   BREAST BIOPSY Left 2007   BUNIONECTOMY WITH HAMMERTOE RECONSTRUCTION Bilateral right 07/ 2013;  left 11/ 2013   CATARACT EXTRACTION W/ INTRAOCULAR LENS  IMPLANT, BILATERAL  right 09-24-2011;  left 02-11-2012   [facility]   and [facility] eye had corneal transplant DSAEK at same time cataract extraction   COLONOSCOPY     DILATATION & CURETTAGE/HYSTEROSCOPY WITH MYOSURE N/A 06/24/2016   Procedure: DILATATION & CURETTAGE/HYSTEROSCOPY WITH MYOSURE;  Surgeon: Ranae Pila, MD;  Location: [facility];  Service: Gynecology;  Laterality: N/A;   TUBAL LIGATION  1980's   Social History   Occupational History   Occupation: Programmer, systems  Tobacco Use   Smoking status: Never   Smokeless tobacco: Never  Vaping Use   Vaping status: Never Used  Substance and Sexual Activity   Alcohol use: No   Drug use: No   Sexual activity: Not Currently

## 2022-11-13 DIAGNOSIS — Z23 Encounter for immunization: Secondary | ICD-10-CM | POA: Diagnosis not present

## 2022-12-06 ENCOUNTER — Encounter: Payer: Self-pay | Admitting: Podiatry

## 2022-12-06 ENCOUNTER — Ambulatory Visit: Payer: Medicare PPO | Admitting: Podiatry

## 2022-12-06 DIAGNOSIS — B351 Tinea unguium: Secondary | ICD-10-CM

## 2022-12-06 DIAGNOSIS — Z79899 Other long term (current) drug therapy: Secondary | ICD-10-CM | POA: Diagnosis not present

## 2022-12-06 MED ORDER — FLUCONAZOLE 150 MG PO TABS: 150.0000 mg | ORAL_TABLET | ORAL | 0 refills | Status: DC

## 2022-12-06 NOTE — Patient Instructions (Signed)
Fluconazole Tablets What is this medication? FLUCONAZOLE (floo KON na zole) prevents and treats fungal or yeast infections. It belongs to a group of medications called antifungals. It will not prevent or treat colds, the flu, or infections caused by bacteria or viruses. This medicine may be used for other purposes; ask your health care provider or pharmacist if you have questions. COMMON BRAND NAME(S): Diflucan What should I tell my care team before I take this medication? They need to know if you have any of these conditions: Irregular heartbeat or rhythm Kidney disease Liver disease Low levels of potassium in the blood An unusual or allergic reaction to fluconazole, other medications, foods, dyes, or preservatives Pregnant or trying to get pregnant Breastfeeding How should I use this medication? Take this medication by mouth. Take it as directed on the prescription label at the same time every day. You can take it with or without food. If it upsets your stomach, take it with food. Take all of this medication unless your care team tells you to stop it early. Keep taking it even if you think you are better. Talk to your care team about the use of this medication in children. While it may be prescribed for children as young as newborns for selected conditions, precautions do apply. Overdosage: If you think you have taken too much of this medicine contact a poison control center or emergency room at once. NOTE: This medicine is only for you. Do not share this medicine with others. What if I miss a dose? If you miss a dose, take it as soon as you can. If it is almost time for your next dose, take only that dose. Do not take double or extra doses. What may interact with this medication? Do not take this medication with any of the following: Adagrasib Flibanserin Lomitapide Lonafarnib Other medications that cause heart rhythm changes Triazolam This medication may also interact with the  following: Abrocitinib Certain antibiotics, such as rifabutin or rifampin Certain antivirals for HIV or hepatitis Certain medications for blood pressure, heart disease, irregular heartbeat Certain medications for cholesterol, such as atorvastatin, lovastatin, simvastatin Certain medications for depression, such as amitriptyline or nortriptyline Certain medications for diabetes, such as glipizide or glyburide Certain medications for seizures, such as carbamazepine or phenytoin Certain medications that treat or prevent blood clots, such as warfarin Certain opioid medications for pain, such as alfentanil, fentanyl, methadone Cyclophosphamide Cyclosporine Ibrutinib Lemborexant Midazolam NSAIDS, medications for pain and inflammation, such as ibuprofen or naproxen Olaparib Sirolimus Steroid medications, such as prednisone Tacrolimus Theophylline Tofacitinib Tolvaptan Vinblastine Vincristine Vitamin A Voriconazole This list may not describe all possible interactions. Give your health care provider a list of all the medicines, herbs, non-prescription drugs, or dietary supplements you use. Also tell them if you smoke, drink alcohol, or use illegal drugs. Some items may interact with your medicine. What should I watch for while using this medication? Visit your care team for regular checkups. If you are taking this medication for a long time you may need blood work. Tell your care team if your symptoms do not improve. Some fungal infections need many weeks or months of treatment to cure. Alcohol can increase possible damage to your liver. Avoid alcoholic drinks. If you have a vaginal infection, do not have sex until you have finished your treatment. You can wear a sanitary napkin. Do not use tampons. Wear freshly washed cotton, not synthetic, panties. What side effects may I notice from receiving this medication? Side effects that  you should report to your care team as soon as  possible: Allergic reactions--skin rash, itching, hives, swelling of the face, lips, tongue, or throat Heart rhythm changes--fast or irregular heartbeat, dizziness, feeling faint or lightheaded, chest pain, trouble breathing Liver injury--right upper belly pain, loss of appetite, nausea, light-colored stool, dark yellow or brown urine, yellowing skin or eyes, unusual weakness or fatigue Low adrenal gland function--nausea, vomiting, loss of appetite, unusual weakness or fatigue, dizziness Rash, fever, and swollen lymph nodes Redness, blistering, peeling, or loosening of the skin, including inside the mouth Seizures Side effects that usually do not require medical attention (report to your care team if they continue or are bothersome): Change in taste Diarrhea Dizziness Headache Nausea Stomach pain This list may not describe all possible side effects. Call your doctor for medical advice about side effects. You may report side effects to FDA at 1-800-FDA-1088. Where should I keep my medication? Keep out of the reach of children. Store at room temperature below 30 degrees C (86 degrees F). Throw away any medication after the expiration date. NOTE: This sheet is a summary. It may not cover all possible information. If you have questions about this medicine, talk to your doctor, pharmacist, or health care provider.  2024 Elsevier/Gold Standard (2022-03-06 00:00:00)

## 2022-12-07 LAB — CBC WITH DIFFERENTIAL/PLATELET
Basophils Absolute: 0 10*3/uL (ref 0.0–0.2)
Basos: 1 %
EOS (ABSOLUTE): 0.2 10*3/uL (ref 0.0–0.4)
Eos: 3 %
Hematocrit: 42.7 % (ref 34.0–46.6)
Hemoglobin: 13.6 g/dL (ref 11.1–15.9)
Immature Grans (Abs): 0 10*3/uL (ref 0.0–0.1)
Immature Granulocytes: 0 %
Lymphocytes Absolute: 2.1 10*3/uL (ref 0.7–3.1)
Lymphs: 36 %
MCH: 27.8 pg (ref 26.6–33.0)
MCHC: 31.9 g/dL (ref 31.5–35.7)
MCV: 87 fL (ref 79–97)
Monocytes Absolute: 0.5 10*3/uL (ref 0.1–0.9)
Monocytes: 8 %
Neutrophils Absolute: 3.1 10*3/uL (ref 1.4–7.0)
Neutrophils: 52 %
Platelets: 324 10*3/uL (ref 150–450)
RBC: 4.9 x10E6/uL (ref 3.77–5.28)
RDW: 13.4 % (ref 11.7–15.4)
WBC: 6 10*3/uL (ref 3.4–10.8)

## 2022-12-07 LAB — HEPATIC FUNCTION PANEL
ALT: 19 [IU]/L (ref 0–32)
AST: 24 [IU]/L (ref 0–40)
Albumin: 4.3 g/dL (ref 3.8–4.8)
Alkaline Phosphatase: 137 [IU]/L — ABNORMAL HIGH (ref 44–121)
Bilirubin Total: <0.2 mg/dL (ref 0.0–1.2)
Bilirubin, Direct: 0.09 mg/dL (ref 0.00–0.40)
Total Protein: 7.2 g/dL (ref 6.0–8.5)

## 2022-12-09 NOTE — Progress Notes (Signed)
Subjective: Chief Complaint  Patient presents with   Nail Problem    Rm#14 Follow up on foot fungus completed pill treatment some improvement.     72 year old female presents the office for follow-up evaluation of nail fungus.  She has been using the topical medication.  Things are getting a little bit better.  No swelling or redness or drainage.  Objective: AAO x3, NAD DP/PT pulses palpable bilaterally, CRT less than 3 seconds There does appear to be some mild clearing on the proximal nail folds and does appear to be improving compared to what it has been previously.  She has no pain to the nail currently no swelling redness or any drainage.  No open lesions noted. No open lesions. No pain with calf compression, swelling, warmth, erythema  Assessment: Onychomycosis  Plan: -All treatment options discussed with the patient including all alternatives, risks, complications.  -Sharply debrided the nails x 10 without any complications or bleeding.  She can continue topical medication.  Will also going to add oral fluconazole weekly.  Recheck CBC, LFT.  Order given and she is aware to get this done.  Monitoring side effects.  Return in about 3 months (around 03/08/2023), or if symptoms worsen or fail to improve, for nail fungus.  Vivi Barrack DPM

## 2022-12-11 ENCOUNTER — Other Ambulatory Visit: Payer: Self-pay | Admitting: Podiatry

## 2022-12-11 DIAGNOSIS — R899 Unspecified abnormal finding in specimens from other organs, systems and tissues: Secondary | ICD-10-CM

## 2022-12-23 ENCOUNTER — Encounter: Payer: No Typology Code available for payment source | Admitting: Internal Medicine

## 2022-12-24 ENCOUNTER — Encounter: Payer: Self-pay | Admitting: Internal Medicine

## 2022-12-24 ENCOUNTER — Ambulatory Visit (INDEPENDENT_AMBULATORY_CARE_PROVIDER_SITE_OTHER): Payer: Medicare PPO | Admitting: Internal Medicine

## 2022-12-24 VITALS — BP 122/78 | HR 83 | Temp 97.8°F | Ht 65.0 in | Wt 175.0 lb

## 2022-12-24 DIAGNOSIS — E785 Hyperlipidemia, unspecified: Secondary | ICD-10-CM | POA: Diagnosis not present

## 2022-12-24 DIAGNOSIS — Z7984 Long term (current) use of oral hypoglycemic drugs: Secondary | ICD-10-CM | POA: Diagnosis not present

## 2022-12-24 DIAGNOSIS — Z0000 Encounter for general adult medical examination without abnormal findings: Secondary | ICD-10-CM | POA: Diagnosis not present

## 2022-12-24 DIAGNOSIS — E1169 Type 2 diabetes mellitus with other specified complication: Secondary | ICD-10-CM | POA: Diagnosis not present

## 2022-12-24 DIAGNOSIS — E118 Type 2 diabetes mellitus with unspecified complications: Secondary | ICD-10-CM | POA: Diagnosis not present

## 2022-12-24 DIAGNOSIS — I10 Essential (primary) hypertension: Secondary | ICD-10-CM | POA: Diagnosis not present

## 2022-12-24 LAB — CBC
HCT: 42.4 % (ref 36.0–46.0)
Hemoglobin: 13.5 g/dL (ref 12.0–15.0)
MCHC: 31.9 g/dL (ref 30.0–36.0)
MCV: 87.4 fL (ref 78.0–100.0)
Platelets: 307 10*3/uL (ref 150.0–400.0)
RBC: 4.84 Mil/uL (ref 3.87–5.11)
RDW: 14.9 % (ref 11.5–15.5)
WBC: 6.6 10*3/uL (ref 4.0–10.5)

## 2022-12-24 LAB — COMPREHENSIVE METABOLIC PANEL
ALT: 17 U/L (ref 0–35)
AST: 19 U/L (ref 0–37)
Albumin: 4.3 g/dL (ref 3.5–5.2)
Alkaline Phosphatase: 105 U/L (ref 39–117)
BUN: 13 mg/dL (ref 6–23)
CO2: 30 meq/L (ref 19–32)
Calcium: 9.2 mg/dL (ref 8.4–10.5)
Chloride: 101 meq/L (ref 96–112)
Creatinine, Ser: 0.61 mg/dL (ref 0.40–1.20)
GFR: 88.54 mL/min (ref >60.00)
Glucose, Bld: 144 mg/dL — ABNORMAL HIGH (ref 70–99)
Potassium: 3.1 meq/L — ABNORMAL LOW (ref 3.5–5.1)
Sodium: 141 meq/L (ref 135–145)
Total Bilirubin: 0.2 mg/dL (ref 0.2–1.2)
Total Protein: 7.4 g/dL (ref 6.0–8.3)

## 2022-12-24 LAB — LIPID PANEL
Cholesterol: 148 mg/dL (ref 0–200)
HDL: 53.1 mg/dL (ref >39.00)
LDL Cholesterol: 52 mg/dL (ref 0–99)
NonHDL: 94.86
Total CHOL/HDL Ratio: 3
Triglycerides: 215 mg/dL — ABNORMAL HIGH (ref 0.0–149.0)
VLDL: 43 mg/dL — ABNORMAL HIGH (ref 0.0–40.0)

## 2022-12-24 LAB — MICROALBUMIN / CREATININE URINE RATIO
Creatinine,U: 87.8 mg/dL
Microalb Creat Ratio: 0.8 mg/g (ref 0.0–30.0)
Microalb, Ur: <0.7 mg/dL (ref 0.0–1.9)

## 2022-12-24 MED ORDER — AMLODIPINE BESYLATE 5 MG PO TABS: 5.0000 mg | ORAL_TABLET | Freq: Every day | ORAL | 3 refills | Status: DC

## 2022-12-24 MED ORDER — HYDROCHLOROTHIAZIDE 12.5 MG PO CAPS: 12.5000 mg | ORAL_CAPSULE | Freq: Every day | ORAL | 3 refills | Status: DC

## 2022-12-24 MED ORDER — SIMVASTATIN 20 MG PO TABS: 20.0000 mg | ORAL_TABLET | Freq: Every day | ORAL | 3 refills | Status: DC

## 2022-12-24 MED ORDER — POTASSIUM CHLORIDE CRYS ER 10 MEQ PO TBCR: 10.0000 meq | EXTENDED_RELEASE_TABLET | Freq: Every day | ORAL | 3 refills | Status: DC

## 2022-12-24 NOTE — Progress Notes (Signed)
   Subjective:   Patient ID: Anna Cooper, female    DOB: [birth year], 72 y.o.   MRN: [id number]  HPI The patient is here for physical.  Doralee Albino eye exam  PMH, [facility], social history reviewed and updated  Review of Systems  Constitutional: Negative.   HENT: Negative.    Eyes: Negative.   Respiratory:  Negative for cough, chest tightness and shortness of breath.   Cardiovascular:  Negative for chest pain, palpitations and leg swelling.  Gastrointestinal:  Negative for abdominal distention, abdominal pain, constipation, diarrhea, nausea and vomiting.  Musculoskeletal: Negative.   Skin: Negative.   Neurological: Negative.   Psychiatric/Behavioral: Negative.      Objective:  Physical Exam Constitutional:      Appearance: She is well-developed.  HENT:     Head: Normocephalic and atraumatic.  Cardiovascular:     Rate and Rhythm: Normal rate and regular rhythm.  Pulmonary:     Effort: Pulmonary effort is normal. No respiratory distress.     Breath sounds: Normal breath sounds. No wheezing or rales.  Abdominal:     General: Bowel sounds are normal. There is no distension.     Palpations: Abdomen is soft.     Tenderness: There is no abdominal tenderness. There is no rebound.  Musculoskeletal:     Cervical back: Normal range of motion.  Skin:    General: Skin is warm and dry.  Neurological:     Mental Status: She is alert and oriented to person, place, and time.     Coordination: Coordination normal.     Vitals:   12/24/22 1347  BP: 122/78  Pulse: 83  Temp: 97.8 F (36.6 C)  TempSrc: Oral  SpO2: 96%  Weight: 175 lb (79.4 kg)  Height: 5\' 5"  (1.651 m)    Assessment & Plan:

## 2022-12-25 ENCOUNTER — Ambulatory Visit
Admission: RE | Admit: 2022-12-25 | Discharge: 2022-12-25 | Disposition: A | Discharge status: Home / Self Care | Payer: Medicare PPO | Source: Ambulatory Visit | Attending: Obstetrics and Gynecology | Admitting: Obstetrics and Gynecology

## 2022-12-25 DIAGNOSIS — Z9189 Other specified personal risk factors, not elsewhere classified: Secondary | ICD-10-CM

## 2022-12-25 DIAGNOSIS — Z1239 Encounter for other screening for malignant neoplasm of breast: Secondary | ICD-10-CM | POA: Diagnosis not present

## 2022-12-25 DIAGNOSIS — Z803 Family history of malignant neoplasm of breast: Secondary | ICD-10-CM | POA: Diagnosis not present

## 2022-12-25 MED ORDER — GADOPICLENOL 0.5 MMOL/ML IV SOLN
8.0000 mL | Freq: Once | INTRAVENOUS | Status: AC | PRN
  Administered 2022-12-25: 8 mL via INTRAVENOUS

## 2022-12-26 ENCOUNTER — Other Ambulatory Visit: Payer: Self-pay | Admitting: Medical Genetics

## 2022-12-27 ENCOUNTER — Encounter (INDEPENDENT_AMBULATORY_CARE_PROVIDER_SITE_OTHER): Payer: Self-pay

## 2022-12-27 NOTE — Assessment & Plan Note (Signed)
BP at goal on hydrochlorothiazide 12.5 mg daily and amlodipine 5 mg daily. Checking CMP and adjust as needed.

## 2022-12-27 NOTE — Assessment & Plan Note (Signed)
Checking microalbumin to creatinine ratio and lipid panel. Gets HgA1c with endo and seeing them in Jan so that was not done. At goal on metformin. On statin.

## 2022-12-27 NOTE — Assessment & Plan Note (Signed)
Flu shot complete. Pneumonia complete. Shingrix complete. Tetanus due at pharmacy. Colonoscopy up to date. Mammogram up to date, pap smear aged out and dexa complete. Counseled about sun safety and mole surveillance. Counseled about the dangers of distracted driving. Given 10 year screening recommendations.

## 2022-12-27 NOTE — Assessment & Plan Note (Signed)
Checking lipid panel and adjust as needed. Taking zetia and simvastatin 20 mg daily.

## 2022-12-30 ENCOUNTER — Encounter: Payer: Self-pay | Admitting: Internal Medicine

## 2022-12-31 MED ORDER — POTASSIUM CHLORIDE CRYS ER 20 MEQ PO TBCR: 20.0000 meq | EXTENDED_RELEASE_TABLET | Freq: Every day | ORAL | 3 refills | Status: DC

## 2023-01-14 ENCOUNTER — Other Ambulatory Visit: Payer: Self-pay

## 2023-01-14 ENCOUNTER — Telehealth: Payer: Self-pay | Admitting: Internal Medicine

## 2023-01-14 MED ORDER — POTASSIUM CHLORIDE CRYS ER 20 MEQ PO TBCR: 20.0000 meq | EXTENDED_RELEASE_TABLET | Freq: Every day | ORAL | 3 refills | Status: AC

## 2023-01-14 NOTE — Telephone Encounter (Signed)
 Copied from CRM [id number]. Topic: Clinical - Medication Question >> Jan 14, 2023 10:31 AM Eleanor C wrote: Reason for CRM: patient calling to ensure that her potassium chloride  (KLOR-CON  M) 20 MEQ tablet was sent. She stated that that was initially some confusion with the prescription and it had to be redone. I see that there is a signed prescription but I am unsure if it was sent to the correct pharmacy. Patient states it needs to be sent to   [id number] - [place], [place] - [address] AT [address]  Phone: [phone] Fax: [phone]  ---  Please resend RX to this [facility]. TDW

## 2023-01-14 NOTE — Telephone Encounter (Signed)
Done

## 2023-01-20 ENCOUNTER — Other Ambulatory Visit: Payer: Self-pay | Admitting: Internal Medicine

## 2023-01-20 DIAGNOSIS — Z1231 Encounter for screening mammogram for malignant neoplasm of breast: Secondary | ICD-10-CM

## 2023-01-24 ENCOUNTER — Other Ambulatory Visit (HOSPITAL_COMMUNITY)
Admission: RE | Admit: 2023-01-24 | Discharge: 2023-01-24 | Disposition: A | Discharge status: Home / Self Care | Payer: Self-pay | Source: Ambulatory Visit | Attending: Medical Genetics | Admitting: Medical Genetics

## 2023-01-28 ENCOUNTER — Other Ambulatory Visit: Payer: Self-pay

## 2023-01-28 ENCOUNTER — Encounter: Payer: Self-pay | Admitting: Physical Therapy

## 2023-01-28 ENCOUNTER — Ambulatory Visit: Payer: Medicare PPO | Attending: Obstetrics and Gynecology | Admitting: Physical Therapy

## 2023-01-28 DIAGNOSIS — R293 Abnormal posture: Secondary | ICD-10-CM

## 2023-01-28 DIAGNOSIS — M6281 Muscle weakness (generalized): Secondary | ICD-10-CM

## 2023-01-28 DIAGNOSIS — R279 Unspecified lack of coordination: Secondary | ICD-10-CM | POA: Diagnosis not present

## 2023-01-28 NOTE — Patient Instructions (Signed)

## 2023-01-28 NOTE — Therapy (Signed)
 OUTPATIENT PHYSICAL THERAPY FEMALE PELVIC EVALUATION   Patient Name: Anna Cooper MRN: [id number] DOB:[birth year], 73 y.o., female Today's Date: 01/28/2023  END OF SESSION:  PT End of Session - 01/28/23 1013     Visit Number 1    Date for PT Re-Evaluation 03/28/23    Authorization Type humana MCR    Progress Note Due on Visit 10    PT Start Time 1015    PT Stop Time 1055    PT Time Calculation (min) 40 min    Activity Tolerance Patient tolerated treatment well    Behavior During Therapy [facility] for tasks assessed/performed             Past Medical History:  Diagnosis Date   Fuchs' corneal dystrophy    s/p  bilateral corneal transplant at [facility] -- right 09-24-2011 and left 02-11-2012   History of recurrent UTIs    History of venereal warts    Hyperlipidemia    Hypertension    Infantile paralysis    Major depression    PMB (postmenopausal bleeding)    S/P removal of thyroid  nodule 01/14/2017   Type 2 diabetes mellitus (HCC) 03/31/2016   not on meds currently   Past Surgical History:  Procedure Laterality Date   BREAST BIOPSY Right 11/10/2020   BREAST BIOPSY Left 2007   BUNIONECTOMY WITH HAMMERTOE RECONSTRUCTION Bilateral right 07/ 2013;  left 11/ 2013   CATARACT EXTRACTION W/ INTRAOCULAR LENS  IMPLANT, BILATERAL  right 09-24-2011;  left 02-11-2012   [facility]   and both eye had corneal transplant DSAEK at same time cataract extraction   COLONOSCOPY     DILATATION & CURETTAGE/HYSTEROSCOPY WITH MYOSURE N/A 06/24/2016   Procedure: DILATATION & CURETTAGE/HYSTEROSCOPY WITH MYOSURE;  Surgeon: Marne Kelly Nest, MD;  Location: [facility] [place];  Service: Gynecology;  Laterality: N/A;   TUBAL LIGATION  1980's   Patient Active Problem List   Diagnosis Date Noted   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 08/06/2022   Tinnitus of both ears 12/15/2020   Bilateral hearing loss 12/15/2020   Elevated alkaline phosphatase level 11/06/2020   Osteopenia 10/13/2020   At high risk for  breast cancer 10/05/2018   Thyroid  mass 01/03/2017   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   Genetic testing 08/30/2016   Fuchs' corneal dystrophy 07/15/2012   Pseudophakia of both eyes 07/15/2012   Routine health maintenance 12/08/2010   Type 2 diabetes with complication (HCC) 11/25/2008   HSV infection 03/10/2007   Hyperlipidemia associated with type 2 diabetes mellitus (HCC) 03/10/2007   Hypertension 03/10/2007    PCP: Rollene Almarie LABOR, MD   REFERRING PROVIDER: Marne Kelly Nest, MD   REFERRING DIAG: R32 (ICD-10-CM) - Unspecified urinary incontinence  THERAPY DIAG:  Muscle weakness (generalized)  Abnormal posture  Unspecified lack of coordination  Rationale for Evaluation and Treatment: Rehabilitation  ONSET DATE: 4-5 years  SUBJECTIVE:  SUBJECTIVE STATEMENT: Pt reports using size 8 pads AAT and changes 4-5x during the day, sometimes also changes them at night. Doesn't have leakage with stress that she knows of but does have urge incontinence. And sometimes no awareness she has leakage until pad is wet. Sometimes wakes at night to urinate and can't make it but others wakes damp.  Fluid intake: Yes: water - not a lot, 2-3 cups of coffee daily, 2-3 bottles of teas daily.     PAIN:  Are you having pain? No   PRECAUTIONS: None  RED FLAGS: None   WEIGHT BEARING RESTRICTIONS: No  FALLS:  Has patient fallen in last 6 months? No  LIVING ENVIRONMENT: Lives with: lives alone Lives in: House/apartment   OCCUPATION: tutoring elementary reading and math  PLOF: Independent  PATIENT GOALS: to have less leakage  PERTINENT HISTORY:  Osteopenia, Type 2 diabetes with complication , HSV, HTN Sexual abuse: No  BOWEL MOVEMENT: Pain with bowel movement: No Type of bowel  movement:Type (Bristol Stool Scale) 4, Frequency daily, and Strain No Fully empty rectum: Yes:   Leakage: No Pads: No Fiber supplement: No  URINATION: Pain with urination: No Fully empty bladder: Yes:   Stream: Strong Urgency: Yes:   Frequency: about 2 hours, at night 2x Leakage: Urge to void and Walking to the bathroom Pads: Yes: 4-5 changes daily size 8   INTERCOURSE: Pain with intercourse:  not painful Ability to have vaginal penetration:  Yes:   Climax: not painful  Marinoff Scale: 0/3  PREGNANCY: Vaginal deliveries 2 Tearing No C-section deliveries 0 Currently pregnant No  PROLAPSE: None   OBJECTIVE:  Note: Objective measures were completed at Evaluation unless otherwise noted.  DIAGNOSTIC FINDINGS:   PATIENT SURVEYS:    Urogenital Distress Inventory (UDI-6 Short Form) Score = 25  COGNITION: Overall cognitive status: Within functional limits for tasks assessed     SENSATION: Light touch: Appears intact Proprioception: Appears intact  MUSCLE LENGTH: Bil hamstrings and adductors limited by 25%   POSTURE: rounded shoulders  PELVIC ALIGNMENT: WFL  LUMBARAROM/PROM:  A/PROM A/PROM  eval  Flexion Limited by 25%  Extension WFL  Right lateral flexion Limited by 25%  Left lateral flexion Limited by 25%  Right rotation Limited by 25%  Left rotation Limited by 25%   (Blank rows = not tested)  LOWER EXTREMITY ROM:  WFL  LOWER EXTREMITY MMT:  Bil hips grossly 4/5; knees 5/5  PALPATION:   General  no TTP throughout abdomen, tight lumbar paraspinals and gluteals                External Perineal Exam redness at mid vulva and labia minora bil, no pain                             Internal Pelvic Floor no TTP  Patient confirms identification and approves PT to assess internal pelvic floor and treatment Yes No emotional/communication barriers or cognitive limitation. Patient is motivated to learn. Patient understands and agrees with treatment goals  and plan. PT explains patient will be examined in standing, sitting, and lying down to see how their muscles and joints work. When they are ready, they will be asked to remove their underwear so PT can examine their perineum. The patient is also given the option of providing their own chaperone as one is not provided in our facility. The patient also has the right and is explained the right to defer or  refuse any part of the evaluation or treatment including the internal exam. With the patient's consent, PT will use one gloved finger to gently assess the muscles of the pelvic floor, seeing how well it contracts and relaxes and if there is muscle symmetry. After, the patient will get dressed and PT and patient will discuss exam findings and plan of care. PT and patient discuss plan of care, schedule, attendance policy and HEP activities.  PELVIC MMT:   MMT eval  Vaginal 3/5, 4s, 6 reps  Internal Anal Sphincter   External Anal Sphincter   Puborectalis   Diastasis Recti   (Blank rows = not tested)        TONE: WFL  PROLAPSE: Not seen in hooklying with cough  TODAY'S TREATMENT:                                                                                                                              DATE:   01/28/23 EVAL Examination completed, findings reviewed, pt educated on POC, HEP. Pt motivated to participate in PT and agreeable to attempt recommendations.     PATIENT EDUCATION:  Education details: [id number] Person educated: Patient Education method: Explanation, Demonstration, Tactile cues, Verbal cues, and Handouts Education comprehension: verbalized understanding and returned demonstration  HOME EXERCISE PROGRAM: [id number]  ASSESSMENT:  CLINICAL IMPRESSION: Patient is a 73 y.o. female  who was seen today for physical therapy evaluation and treatment for urinary incontinence. Pt found to have decreased flexibility in spine and hips, decreased core and hip strength minimally.  Patient consented to internal pelvic floor assessment vaginally this date and found to have decreased strength, endurance, and coordination. Patient benefited from verbal cues for improved technique with pelvic floor contractions and coordination with breathing. Pt tolerated well and educated on HEP and bladder irritants. Pt would benefit from additional PT to further address deficits.    OBJECTIVE IMPAIRMENTS: decreased coordination, decreased endurance, decreased mobility, decreased strength, increased muscle spasms, impaired flexibility, improper body mechanics, and postural dysfunction.   ACTIVITY LIMITATIONS: continence  PARTICIPATION LIMITATIONS: community activity  PERSONAL FACTORS: Time since onset of injury/illness/exacerbation are also affecting patient's functional outcome.   REHAB POTENTIAL: Good  CLINICAL DECISION MAKING: Stable/uncomplicated  EVALUATION COMPLEXITY: Low   GOALS: Goals reviewed with patient? Yes  SHORT TERM GOALS: Target date: 02/25/23  Pt to be I with HEP.  Baseline: Goal status: INITIAL  2.  Pt to demonstrate improved coordination of pelvic floor and breathing mechanics with body weight squat with appropriate synergistic patterns to decrease pain and leakage at least 50% of the time.    Baseline:  Goal status: INITIAL  3.  Pt to be I with urge drill for decreased urinary incontinence with not making it to bathroom quickly enough.  Baseline:  Goal status: INITIAL  4.  Pt to be I with pressure management to decrease stress at bladder with attempts at holding urine.  Baseline:  Goal status: INITIAL   LONG  TERM GOALS: Target date: 03/28/23  Pt to be I with advanced HEP.  Baseline:  Goal status: INITIAL  2.  Pt to demonstrate improved coordination of pelvic floor and breathing mechanics with 10# squat with appropriate synergistic patterns to decrease pain and leakage at least 75% of the time.    Baseline:  Goal status: INITIAL  3.  Pt will have  25% less urgency due to bladder retraining and strengthening  Baseline:  Goal status: INITIAL  4.  Pt with demonstrate full ROM of trunk in all directions for decreased stress at pelvic floor and leakage.  Baseline:  Goal status: INITIAL  5.  Pt to report no more than one instance of urinary incontinence in a week for improved QOL.  Baseline:  Goal status: INITIAL  6.  Pt to demonstrate at least 5/5 bil hip strength for improved pelvic stability and functional squats without leakage.  Baseline:  Goal status: INITIAL  PLAN:  PT FREQUENCY: 1x/week  PT DURATION:  8 sessions  PLANNED INTERVENTIONS: 97110-Therapeutic exercises, 97530- Therapeutic activity, 97112- Neuromuscular re-education, 97535- Self Care, [id number]- Manual therapy, Patient/Family education, Taping, Dry Needling, Joint mobilization, Spinal mobilization, Scar mobilization, Cryotherapy, Moist heat, and Biofeedback  PLAN FOR NEXT SESSION: coordination of pelvic floor with strengthening, urge drill, breathing mechanics, and hip and core strengthening, pelvic floor strengthening   Darryle Navy, PT, DPT 01/27/2509:22 AM

## 2023-02-04 ENCOUNTER — Ambulatory Visit: Payer: Medicare PPO | Admitting: Physical Therapy

## 2023-02-04 DIAGNOSIS — M6281 Muscle weakness (generalized): Secondary | ICD-10-CM

## 2023-02-04 DIAGNOSIS — R279 Unspecified lack of coordination: Secondary | ICD-10-CM | POA: Diagnosis not present

## 2023-02-04 DIAGNOSIS — R293 Abnormal posture: Secondary | ICD-10-CM

## 2023-02-04 LAB — GENECONNECT MOLECULAR SCREEN: Genetic Analysis Overall Interpretation: NEGATIVE

## 2023-02-04 NOTE — Patient Instructions (Signed)
Urge Incontinence  Ideal urination frequency is every 2-4 wakeful hours, which equates to 5-8 times within a 24-hour period.   Urge incontinence is leakage that occurs when the bladder muscle contracts, creating a sudden need to go before getting to the bathroom.   Going too often when your bladder isn't actually full can disrupt the body's automatic signals to store and hold urine longer, which will increase urgency/frequency.  In this case, the bladder "is running the show" and strategies can be learned to retrain this pattern.   One should be able to control the first urge to urinate, at around 150mL.  The bladder can hold up to a "grande latte," or 400mL. To help you gain control, practice the Urge Drill below when urgency strikes.  This drill will help retrain your bladder signals and allow you to store and hold urine longer.  The overall goal is to stretch out your time between voids to reach a more manageable voiding schedule.    Practice your "quick flicks" often throughout the day (each waking hour) even when you don't need feel the urge to go.  This will help strengthen your pelvic floor muscles, making them more effective in controlling leakage.  Urge Drill  When you feel an urge to go, follow these steps to regain control: Stop what you are doing and be still Take one deep breath, directing your air into your abdomen Think an affirming thought, such as "I've got this." Do 5 quick flicks of your pelvic floor Walk with control to the bathroom to void, or delay voiding   Bladder Irritants  Certain foods and beverages can be irritating to the bladder.  Avoiding these irritants may decrease your symptoms of urinary urgency, frequency or bladder pain.  Even reducing your intake can help with your symptoms.  Not everyone is sensitive to all bladder irritants, so you may consider focusing on one irritant at a time, removing or reducing your intake of that irritant for 7-10 days to see if  this change helps your symptoms.  Water intake is also very important.  Below is a list of bladder irritants.  Drinks: alcohol, carbonated beverages, caffeinated beverages such as coffee and tea, drinks with artificial sweeteners, citrus juices, apple juice, tomato juice  Foods: tomatoes and tomato based foods, spicy food, sugar and artificial sweeteners, vinegar, chocolate, raw onion, apples, citrus fruits, pineapple, cranberries, tomatoes, strawberries, plums, peaches, cantaloupe  Other: acidic urine (too concentrated) - see water intake info below  Substitutes you can try that are NOT irritating to the bladder: cooked onion, pears, papayas, sun-brewed decaf teas, watermelons, non-citrus herbal teas, apricots, kava and low-acid instant drinks (Postum).    WATER INTAKE: Remember to drink lots of water (aim for fluid intake of half your body weight with 2/3 of fluids being water).  You may be limiting fluids due to fear of leakage, but this can actually worsen urgency symptoms due to highly concentrated urine.  Water helps balance the pH of your urine so it doesn't become too acidic - acidic urine is a bladder irritant!    

## 2023-02-04 NOTE — Therapy (Signed)
OUTPATIENT PHYSICAL THERAPY FEMALE PELVIC TREATMENT   Patient Name: Anna Cooper MRN: [id number] DOB:09/27/1950, 73 y.o., female Today's Date: 02/04/2023  END OF SESSION:  PT End of Session - 02/04/23 0937     Visit Number 2    Date for PT Re-Evaluation 03/28/23    Authorization Type humana MCR    Progress Note Due on Visit 10    PT Start Time 0936   arival time   PT Stop Time 1012    PT Time Calculation (min) 36 min    Activity Tolerance Patient tolerated treatment well    Behavior During Therapy [facility] for tasks assessed/performed             Past Medical History:  Diagnosis Date   Fuchs' corneal dystrophy    s/p  bilateral corneal transplant at [facility] -- right 09-24-2011 and left 02-11-2012   History of recurrent UTIs    History of venereal warts    Hyperlipidemia    Hypertension    Infantile paralysis    Major depression    PMB (postmenopausal bleeding)    S/P removal of thyroid nodule 01/14/2017   Type 2 diabetes mellitus (HCC) 03/31/2016   not on meds currently   Past Surgical History:  Procedure Laterality Date   BREAST BIOPSY Right 11/10/2020   BREAST BIOPSY Left 2007   BUNIONECTOMY WITH HAMMERTOE RECONSTRUCTION Bilateral right 07/ 2013;  left 11/ 2013   CATARACT EXTRACTION W/ INTRAOCULAR LENS  IMPLANT, BILATERAL  right 09-24-2011;  left 02-11-2012   [facility]   and both eye had corneal transplant DSAEK at same time cataract extraction   COLONOSCOPY     DILATATION & CURETTAGE/HYSTEROSCOPY WITH MYOSURE N/A 06/24/2016   Procedure: DILATATION & CURETTAGE/HYSTEROSCOPY WITH MYOSURE;  Surgeon: Ranae Pila, MD;  Location: [facility];  Service: Gynecology;  Laterality: N/A;   TUBAL LIGATION  1980's   Patient Active Problem List   Diagnosis Date Noted   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 08/06/2022   Tinnitus of both ears 12/15/2020   Bilateral hearing loss 12/15/2020   Elevated alkaline phosphatase level 11/06/2020   Osteopenia 10/13/2020   At  high risk for breast cancer 10/05/2018   Thyroid mass 01/03/2017   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   Genetic testing 08/30/2016   Fuchs' corneal dystrophy 07/15/2012   Pseudophakia of both eyes 07/15/2012   Routine health maintenance 12/08/2010   Type 2 diabetes with complication (HCC) 11/25/2008   HSV infection 03/10/2007   Hyperlipidemia associated with type 2 diabetes mellitus (HCC) 03/10/2007   Hypertension 03/10/2007    PCP: Myrlene Broker, MD   REFERRING PROVIDER: Ranae Pila, MD   REFERRING DIAG: R32 (ICD-10-CM) - Unspecified urinary incontinence  THERAPY DIAG:  Muscle weakness (generalized)  Abnormal posture  Unspecified lack of coordination  Rationale for Evaluation and Treatment: Rehabilitation  ONSET DATE: 4-5 years  SUBJECTIVE:  SUBJECTIVE STATEMENT: Now using 2-3 size 8 pads 25-50% wet with changes. No night time changes in the last couple nights, woke dry this morning. Getting 1x nightly without leakage. Frequency 2-3 hours now.   Fluid intake: Yes: water - not a lot, 2-3 cups of coffee daily, 2-3 bottles of teas daily.     PAIN:  Are you having pain? No   PRECAUTIONS: None  RED FLAGS: None   WEIGHT BEARING RESTRICTIONS: No  FALLS:  Has patient fallen in last 6 months? No  LIVING ENVIRONMENT: Lives with: lives alone Lives in: House/apartment   OCCUPATION: tutoring elementary reading and math  PLOF: Independent  PATIENT GOALS: to have less leakage  PERTINENT HISTORY:  Osteopenia, Type 2 diabetes with complication , HSV, HTN Sexual abuse: No  BOWEL MOVEMENT: Pain with bowel movement: No Type of bowel movement:Type (Bristol Stool Scale) 4, Frequency daily, and Strain No Fully empty rectum: Yes:   Leakage: No Pads:  No Fiber supplement: No  URINATION: Pain with urination: No Fully empty bladder: Yes:   Stream: Strong Urgency: Yes:   Frequency: about 2 hours, at night 2x Leakage: Urge to void and Walking to the bathroom Pads: Yes: 4-5 changes daily size 8   INTERCOURSE: Pain with intercourse:  not painful Ability to have vaginal penetration:  Yes:   Climax: not painful  Marinoff Scale: 0/3  PREGNANCY: Vaginal deliveries 2 Tearing No C-section deliveries 0 Currently pregnant No  PROLAPSE: None   OBJECTIVE:  Note: Objective measures were completed at Evaluation unless otherwise noted.  DIAGNOSTIC FINDINGS:   PATIENT SURVEYS:    Urogenital Distress Inventory (UDI-6 Short Form) Score = 25  COGNITION: Overall cognitive status: Within functional limits for tasks assessed     SENSATION: Light touch: Appears intact Proprioception: Appears intact  MUSCLE LENGTH: Bil hamstrings and adductors limited by 25%   POSTURE: rounded shoulders  PELVIC ALIGNMENT: WFL  LUMBARAROM/PROM:  A/PROM A/PROM  eval  Flexion Limited by 25%  Extension WFL  Right lateral flexion Limited by 25%  Left lateral flexion Limited by 25%  Right rotation Limited by 25%  Left rotation Limited by 25%   (Blank rows = not tested)  LOWER EXTREMITY ROM:  WFL  LOWER EXTREMITY MMT:  Bil hips grossly 4/5; knees 5/5  PALPATION:   General  no TTP throughout abdomen, tight lumbar paraspinals and gluteals                External Perineal Exam redness at mid vulva and labia minora bil, no pain                             Internal Pelvic Floor no TTP  Patient confirms identification and approves PT to assess internal pelvic floor and treatment Yes No emotional/communication barriers or cognitive limitation. Patient is motivated to learn. Patient understands and agrees with treatment goals and plan. PT explains patient will be examined in standing, sitting, and lying down to see how their muscles and  joints work. When they are ready, they will be asked to remove their underwear so PT can examine their perineum. The patient is also given the option of providing their own chaperone as one is not provided in our facility. The patient also has the right and is explained the right to defer or refuse any part of the evaluation or treatment including the internal exam. With the patient's consent, PT will use one gloved finger to gently assess  the muscles of the pelvic floor, seeing how well it contracts and relaxes and if there is muscle symmetry. After, the patient will get dressed and PT and patient will discuss exam findings and plan of care. PT and patient discuss plan of care, schedule, attendance policy and HEP activities.  PELVIC MMT:   MMT eval  Vaginal 3/5, 4s, 6 reps  Internal Anal Sphincter   External Anal Sphincter   Puborectalis   Diastasis Recti   (Blank rows = not tested)        TONE: WFL  PROLAPSE: Not seen in hooklying with cough  TODAY'S TREATMENT:                                                                                                                              DATE:   01/28/23 EVAL Examination completed, findings reviewed, pt educated on POC, HEP. Pt motivated to participate in PT and agreeable to attempt recommendations.    02/04/23:  There Act: Pt educated on urge drill, bladder irritants  NMRE: all exercises cued  for exhale and pelvic floor contraction with activity for improved coordination of muscle activation and decreased stress at pelvic floor for decreased leakage.   Opposite hand/knee ball press 2x10 Hooklying ball squeeze 2x10 Sidelying ball press with hip abduction 2x10 each Farmers carry 10# x10 (one each hand 1000') 2x10 Sit to stand 10#   PATIENT EDUCATION:  Education details: [id number] Person educated: Patient Education method: Explanation, Demonstration, Tactile cues, Verbal cues, and Handouts Education comprehension: verbalized  understanding and returned demonstration  HOME EXERCISE PROGRAM: [id number]  ASSESSMENT:  CLINICAL IMPRESSION: Patient returned for treatment today, reports ~50% improvement in symptoms since eval. Session focused on education for urge drill and NMRE for pelvic floor coordination with activity. Pt tolerated well, no leakage did benefit from moderate verbal cues for techniques. Pt would benefit from additional PT to further address deficits.    OBJECTIVE IMPAIRMENTS: decreased coordination, decreased endurance, decreased mobility, decreased strength, increased muscle spasms, impaired flexibility, improper body mechanics, and postural dysfunction.   ACTIVITY LIMITATIONS: continence  PARTICIPATION LIMITATIONS: community activity  PERSONAL FACTORS: Time since onset of injury/illness/exacerbation are also affecting patient's functional outcome.   REHAB POTENTIAL: Good  CLINICAL DECISION MAKING: Stable/uncomplicated  EVALUATION COMPLEXITY: Low   GOALS: Goals reviewed with patient? Yes  SHORT TERM GOALS: Target date: 02/25/23  Pt to be I with HEP.  Baseline: Goal status: INITIAL  2.  Pt to demonstrate improved coordination of pelvic floor and breathing mechanics with body weight squat with appropriate synergistic patterns to decrease pain and leakage at least 50% of the time.    Baseline:  Goal status: INITIAL  3.  Pt to be I with urge drill for decreased urinary incontinence with not making it to bathroom quickly enough.  Baseline:  Goal status: INITIAL  4.  Pt to be I with pressure management to decrease stress at bladder with attempts at holding urine.  Baseline:  Goal status: INITIAL   LONG TERM GOALS: Target date: 03/28/23  Pt to be I with advanced HEP.  Baseline:  Goal status: INITIAL  2.  Pt to demonstrate improved coordination of pelvic floor and breathing mechanics with 10# squat with appropriate synergistic patterns to decrease pain and leakage at least 75% of the  time.    Baseline:  Goal status: INITIAL  3.  Pt will have 25% less urgency due to bladder retraining and strengthening  Baseline:  Goal status: INITIAL  4.  Pt with demonstrate full ROM of trunk in all directions for decreased stress at pelvic floor and leakage.  Baseline:  Goal status: INITIAL  5.  Pt to report no more than one instance of urinary incontinence in a week for improved QOL.  Baseline:  Goal status: INITIAL  6.  Pt to demonstrate at least 5/5 bil hip strength for improved pelvic stability and functional squats without leakage.  Baseline:  Goal status: INITIAL  PLAN:  PT FREQUENCY: 1x/week  PT DURATION:  8 sessions  PLANNED INTERVENTIONS: 97110-Therapeutic exercises, 97530- Therapeutic activity, 97112- Neuromuscular re-education, 97535- Self Care, [id number]- Manual therapy, Patient/Family education, Taping, Dry Needling, Joint mobilization, Spinal mobilization, Scar mobilization, Cryotherapy, Moist heat, and Biofeedback  PLAN FOR NEXT SESSION: coordination of pelvic floor with strengthening, urge drill, breathing mechanics, and hip and core strengthening, pelvic floor strengthening   Otelia Sergeant, PT, DPT 02/04/2508:14 AM

## 2023-02-11 ENCOUNTER — Other Ambulatory Visit: Payer: Self-pay | Admitting: Podiatry

## 2023-02-11 ENCOUNTER — Encounter: Payer: Medicare PPO | Admitting: Physical Therapy

## 2023-02-14 DIAGNOSIS — E559 Vitamin D deficiency, unspecified: Secondary | ICD-10-CM | POA: Diagnosis not present

## 2023-02-14 DIAGNOSIS — E042 Nontoxic multinodular goiter: Secondary | ICD-10-CM | POA: Diagnosis not present

## 2023-02-14 DIAGNOSIS — K760 Fatty (change of) liver, not elsewhere classified: Secondary | ICD-10-CM | POA: Diagnosis not present

## 2023-02-14 DIAGNOSIS — E119 Type 2 diabetes mellitus without complications: Secondary | ICD-10-CM | POA: Diagnosis not present

## 2023-02-14 DIAGNOSIS — E538 Deficiency of other specified B group vitamins: Secondary | ICD-10-CM | POA: Diagnosis not present

## 2023-02-18 ENCOUNTER — Ambulatory Visit: Payer: Medicare PPO | Attending: Obstetrics and Gynecology | Admitting: Physical Therapy

## 2023-02-18 DIAGNOSIS — R279 Unspecified lack of coordination: Secondary | ICD-10-CM | POA: Diagnosis not present

## 2023-02-18 DIAGNOSIS — R293 Abnormal posture: Secondary | ICD-10-CM | POA: Diagnosis not present

## 2023-02-18 DIAGNOSIS — M6281 Muscle weakness (generalized): Secondary | ICD-10-CM | POA: Diagnosis not present

## 2023-02-18 NOTE — Therapy (Addendum)
 OUTPATIENT PHYSICAL THERAPY FEMALE PELVIC TREATMENT   Patient Name: Anna Cooper MRN: [id number] DOB:1950-11-08, 73 y.o., female Today's Date: 02/18/2023  END OF SESSION:  PT End of Session - 02/18/23 0938     Visit Number 3    Date for PT Re-Evaluation 03/28/23    Authorization Type humana MCR    Progress Note Due on Visit 10    PT Start Time 0935    PT Stop Time 1015    PT Time Calculation (min) 40 min    Activity Tolerance Patient tolerated treatment well    Behavior During Therapy [facility] for tasks assessed/performed             Past Medical History:  Diagnosis Date   Fuchs' corneal dystrophy    s/p  bilateral corneal transplant at [facility] -- right 09-24-2011 and left 02-11-2012   History of recurrent UTIs    History of venereal warts    Hyperlipidemia    Hypertension    Infantile paralysis    Major depression    PMB (postmenopausal bleeding)    S/P removal of thyroid  nodule 01/14/2017   Type 2 diabetes mellitus (HCC) 03/31/2016   not on meds currently   Past Surgical History:  Procedure Laterality Date   BREAST BIOPSY Right 11/10/2020   BREAST BIOPSY Left 2007   BUNIONECTOMY WITH HAMMERTOE RECONSTRUCTION Bilateral right 07/ 2013;  left 11/ 2013   CATARACT EXTRACTION W/ INTRAOCULAR LENS  IMPLANT, BILATERAL  right 09-24-2011;  left 02-11-2012   [facility]   and both eye had corneal transplant DSAEK at same time cataract extraction   COLONOSCOPY     DILATATION & CURETTAGE/HYSTEROSCOPY WITH MYOSURE N/A 06/24/2016   Procedure: DILATATION & CURETTAGE/HYSTEROSCOPY WITH MYOSURE;  Surgeon: Marne Kelly Nest, MD;  Location: [facility] [place];  Service: Gynecology;  Laterality: N/A;   TUBAL LIGATION  1980's   Patient Active Problem List   Diagnosis Date Noted   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 08/06/2022   Tinnitus of both ears 12/15/2020   Bilateral hearing loss 12/15/2020   Elevated alkaline phosphatase level 11/06/2020   Osteopenia 10/13/2020   At high risk for  breast cancer 10/05/2018   Thyroid  mass 01/03/2017   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   Genetic testing 08/30/2016   Fuchs' corneal dystrophy 07/15/2012   Pseudophakia of both eyes 07/15/2012   Routine health maintenance 12/08/2010   Type 2 diabetes with complication (HCC) 11/25/2008   HSV infection 03/10/2007   Hyperlipidemia associated with type 2 diabetes mellitus (HCC) 03/10/2007   Hypertension 03/10/2007    PCP: Rollene Almarie LABOR, MD   REFERRING PROVIDER: Marne Kelly Nest, MD   REFERRING DIAG: R32 (ICD-10-CM) - Unspecified urinary incontinence  THERAPY DIAG:  Muscle weakness (generalized)  Abnormal posture  Unspecified lack of coordination  Rationale for Evaluation and Treatment: Rehabilitation  ONSET DATE: 4-5 years  SUBJECTIVE:  SUBJECTIVE STATEMENT: Pt reports she is now down to 1-2 pads per day and does change them once any leakage on them and usually a few drops now. And also no urinating every 3-4 hours.   Fluid intake: Yes: water - not a lot, 2-3 cups of coffee daily, 2-3 bottles of teas daily.     PAIN:  Are you having pain? No   PRECAUTIONS: None  RED FLAGS: None   WEIGHT BEARING RESTRICTIONS: No  FALLS:  Has patient fallen in last 6 months? No  LIVING ENVIRONMENT: Lives with: lives alone Lives in: House/apartment   OCCUPATION: tutoring elementary reading and math  PLOF: Independent  PATIENT GOALS: to have less leakage  PERTINENT HISTORY:  Osteopenia, Type 2 diabetes with complication , HSV, HTN Sexual abuse: No  BOWEL MOVEMENT: Pain with bowel movement: No Type of bowel movement:Type (Bristol Stool Scale) 4, Frequency daily, and Strain No Fully empty rectum: Yes:   Leakage: No Pads: No Fiber supplement: No  URINATION: Pain  with urination: No Fully empty bladder: Yes:   Stream: Strong Urgency: Yes:   Frequency: about 2 hours, at night 2x Leakage: Urge to void and Walking to the bathroom Pads: Yes: 4-5 changes daily size 8   INTERCOURSE: Pain with intercourse:  not painful Ability to have vaginal penetration:  Yes:   Climax: not painful  Marinoff Scale: 0/3  PREGNANCY: Vaginal deliveries 2 Tearing No C-section deliveries 0 Currently pregnant No  PROLAPSE: None   OBJECTIVE:  Note: Objective measures were completed at Evaluation unless otherwise noted.  DIAGNOSTIC FINDINGS:   PATIENT SURVEYS:    Urogenital Distress Inventory (UDI-6 Short Form) Score = 25  COGNITION: Overall cognitive status: Within functional limits for tasks assessed     SENSATION: Light touch: Appears intact Proprioception: Appears intact  MUSCLE LENGTH: Bil hamstrings and adductors limited by 25%   POSTURE: rounded shoulders  PELVIC ALIGNMENT: WFL  LUMBARAROM/PROM:  A/PROM A/PROM  eval  Flexion Limited by 25%  Extension WFL  Right lateral flexion Limited by 25%  Left lateral flexion Limited by 25%  Right rotation Limited by 25%  Left rotation Limited by 25%   (Blank rows = not tested)  LOWER EXTREMITY ROM:  WFL  LOWER EXTREMITY MMT:  Bil hips grossly 4/5; knees 5/5  PALPATION:   General  no TTP throughout abdomen, tight lumbar paraspinals and gluteals                External Perineal Exam redness at mid vulva and labia minora bil, no pain                             Internal Pelvic Floor no TTP  Patient confirms identification and approves PT to assess internal pelvic floor and treatment Yes No emotional/communication barriers or cognitive limitation. Patient is motivated to learn. Patient understands and agrees with treatment goals and plan. PT explains patient will be examined in standing, sitting, and lying down to see how their muscles and joints work. When they are ready, they will be  asked to remove their underwear so PT can examine their perineum. The patient is also given the option of providing their own chaperone as one is not provided in our facility. The patient also has the right and is explained the right to defer or refuse any part of the evaluation or treatment including the internal exam. With the patient's consent, PT will use one gloved finger to gently  assess the muscles of the pelvic floor, seeing how well it contracts and relaxes and if there is muscle symmetry. After, the patient will get dressed and PT and patient will discuss exam findings and plan of care. PT and patient discuss plan of care, schedule, attendance policy and HEP activities.  PELVIC MMT:   MMT eval  Vaginal 3/5, 4s, 6 reps  Internal Anal Sphincter   External Anal Sphincter   Puborectalis   Diastasis Recti   (Blank rows = not tested)        TONE: WFL  PROLAPSE: Not seen in hooklying with cough  TODAY'S TREATMENT:                                                                                                                              DATE:   01/28/23 EVAL Examination completed, findings reviewed, pt educated on POC, HEP. Pt motivated to participate in PT and agreeable to attempt recommendations.    02/04/23:  There Act: Pt educated on urge drill, bladder irritants  NMRE: all exercises cued  for exhale and pelvic floor contraction with activity for improved coordination of muscle activation and decreased stress at pelvic floor for decreased leakage.   Opposite hand/knee ball press 2x10 Hooklying ball squeeze 2x10 Sidelying ball press with hip abduction 2x10 each Farmers carry 10# x10 (one each hand 1000') 2x10 Sit to stand 10#   02/18/23  NMRE: all exercises cued  for exhale and pelvic floor contraction with activity for improved coordination of muscle activation and decreased stress at pelvic floor for decreased leakage.   x10 Sit to stand 10#, x10 butt taps 10# 2x01 opp  hand/knee ball press seated 2x10 mario punches 2# Airex step ups +alt march then step down  Farmer's carry 10# + 4# 1000', switched hands then again Urge drill x5 with increased pace of contractions and transfer into standing  PATIENT EDUCATION:  Education details: [id number] Person educated: Patient Education method: Explanation, Demonstration, Tactile cues, Verbal cues, and Handouts Education comprehension: verbalized understanding and returned demonstration  HOME EXERCISE PROGRAM: [email or web address]  ASSESSMENT:  CLINICAL IMPRESSION: Patient returned for treatment today, reports  continued improvement now only needing 1-2 pads a day now. And does plan to buy a much smaller pad size currently at 8 but plans to decreased to 4 or 5 and assess pad use. Tolerated session well. Progressed with strengthening with standing and increased weighted resistance for increased challenge of coordinating pelvic floor with standing activity. Pt would benefit from additional PT to further address deficits.    OBJECTIVE IMPAIRMENTS: decreased coordination, decreased endurance, decreased mobility, decreased strength, increased muscle spasms, impaired flexibility, improper body mechanics, and postural dysfunction.   ACTIVITY LIMITATIONS: continence  PARTICIPATION LIMITATIONS: community activity  PERSONAL FACTORS: Time since onset of injury/illness/exacerbation are also affecting patient's functional outcome.   REHAB POTENTIAL: Good  CLINICAL DECISION MAKING: Stable/uncomplicated  EVALUATION COMPLEXITY: Low   GOALS: Goals reviewed with patient? Yes  SHORT  TERM GOALS: Target date: 02/25/23 Updated 02/18/23  Pt to be I with HEP.  Baseline: Goal status: MET  2.  Pt to demonstrate improved coordination of pelvic floor and breathing mechanics with body weight squat with appropriate synergistic patterns to decrease pain and leakage at least 50% of the time.    Baseline:  Goal status: MET  3.  Pt to be I  with urge drill for decreased urinary incontinence with not making it to bathroom quickly enough.  Baseline:  Goal status: MET  4.  Pt to be I with pressure management to decrease stress at bladder with attempts at holding urine.  Baseline:  Goal status: MET   LONG TERM GOALS: Target date: 03/28/23  Pt to be I with advanced HEP.  Baseline:  Goal status: INITIAL  2.  Pt to demonstrate improved coordination of pelvic floor and breathing mechanics with 10# squat with appropriate synergistic patterns to decrease pain and leakage at least 75% of the time.    Baseline:  Goal status: on going  3.  Pt will have 25% less urgency due to bladder retraining and strengthening  Baseline:  Goal status: MET  4.  Pt with demonstrate full ROM of trunk in all directions for decreased stress at pelvic floor and leakage.  Baseline:  Goal status: on going  5.  Pt to report no more than one instance of urinary incontinence in a week for improved QOL.  Baseline:  Goal status: on going  6.  Pt to demonstrate at least 5/5 bil hip strength for improved pelvic stability and functional squats without leakage.  Baseline:  Goal status: on going  PLAN:  PT FREQUENCY: 1x/week  PT DURATION:  8 sessions  PLANNED INTERVENTIONS: 97110-Therapeutic exercises, 97530- Therapeutic activity, 97112- Neuromuscular re-education, 97535- Self Care, [id number]- Manual therapy, Patient/Family education, Taping, Dry Needling, Joint mobilization, Spinal mobilization, Scar mobilization, Cryotherapy, Moist heat, and Biofeedback  PLAN FOR NEXT SESSION: coordination of pelvic floor with strengthening, urge drill, breathing mechanics, and hip and core strengthening, pelvic floor strengthening   Darryle Navy, PT, DPT 02/18/2508:19 AM

## 2023-02-24 ENCOUNTER — Ambulatory Visit (INDEPENDENT_AMBULATORY_CARE_PROVIDER_SITE_OTHER): Payer: Medicare PPO

## 2023-02-24 VITALS — Ht 65.0 in | Wt 175.0 lb

## 2023-02-24 DIAGNOSIS — Z780 Asymptomatic menopausal state: Secondary | ICD-10-CM | POA: Diagnosis not present

## 2023-02-24 DIAGNOSIS — Z0000 Encounter for general adult medical examination without abnormal findings: Secondary | ICD-10-CM | POA: Diagnosis not present

## 2023-02-24 DIAGNOSIS — M8580 Other specified disorders of bone density and structure, unspecified site: Secondary | ICD-10-CM

## 2023-02-24 NOTE — Progress Notes (Signed)
 Subjective:   Anna Cooper is a 73 y.o. female who presents for Medicare Annual (Subsequent) preventive examination.  Visit Complete: Virtual I connected with  Nani Baba on 02/24/23 by a audio enabled telemedicine application and verified that I am speaking with the correct person using two identifiers.  Patient Location: Home  Provider Location: Office/Clinic  I discussed the limitations of evaluation and management by telemedicine. The patient expressed understanding and agreed to proceed.  Vital Signs: Because this visit was a virtual/telehealth visit, some criteria may be missing or patient reported. Any vitals not documented were not able to be obtained and vitals that have been documented are patient reported.  Cardiac Risk Factors include: advanced age (>57men, >22 women);diabetes mellitus;dyslipidemia;hypertension    Objective:    Today's Vitals   02/24/23 1447  Weight: 175 lb (79.4 kg)  Height: 5\' 5"  (1.651 m)   Body mass index is 29.12 kg/m.     02/24/2023    2:44 PM 01/28/2023   10:22 AM 08/02/2022    8:15 PM 02/21/2022    3:04 PM 02/20/2021    3:44 PM 02/18/2020    8:24 AM 07/01/2017    9:58 AM  Advanced Directives  Does Patient Have a Medical Advance Directive? Yes Yes No Yes Yes No Yes  Type of Estate agent of [place];Living will   Healthcare Power of [place];Living will Living will;Healthcare Power of [employer] Power of [place];Living will  Does patient want to make changes to medical advance directive?  No - Patient declined   No - Patient declined    Copy of Healthcare Power of Attorney in Chart? No - copy requested   No - copy requested No - copy requested  No - copy requested  Would patient like information on creating a medical advance directive?      No - Patient declined     Current Medications (verified) Outpatient Encounter Medications as of 02/24/2023  Medication Sig   amLODipine  (NORVASC ) 5 MG tablet Take  1 tablet (5 mg total) by mouth daily.   Biotin 1 MG CAPS Take by mouth.   Cholecalciferol (VITAMIN D3) 1000 UNITS CAPS Take 1 capsule by mouth daily.    ciclopirox  (PENLAC ) 8 % solution Apply topically at bedtime. Apply over nail and surrounding skin. Apply daily over previous coat. After seven (7) days, may remove with alcohol and continue cycle.   EPINEPHrine  0.3 mg/0.3 mL IJ SOAJ injection Inject 0.3 mg into the muscle as needed for anaphylaxis.   ezetimibe  (ZETIA ) 10 MG tablet Take 1 tablet (10 mg total) by mouth daily.   fluorometholone (FML) 0.1 % ophthalmic suspension Place 1 drop into both eyes daily.    Glucosamine-Chondroit-Vit C-Mn (GLUCOSAMINE 1500 COMPLEX PO) Take 1,500 mg by mouth 2 (two) times daily.   hydrochlorothiazide  (MICROZIDE ) 12.5 MG capsule Take 1 capsule (12.5 mg total) by mouth daily.   metFORMIN (GLUCOPHAGE-XR) 500 MG 24 hr tablet Take 500 mg by mouth daily.   Multiple Vitamins-Minerals (CENTRUM SILVER ADULT 50+ PO) Take 1 tablet by mouth daily.    potassium chloride  SA (KLOR-CON  M) 20 MEQ tablet Take 1 tablet (20 mEq total) by mouth daily.   simvastatin  (ZOCOR ) 20 MG tablet Take 1 tablet (20 mg total) by mouth daily.   [DISCONTINUED] potassium chloride  (K-DUR) 10 MEQ tablet Take 1 tablet (10 mEq total) by mouth daily.   No facility-administered encounter medications on file as of 02/24/2023.    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-24-2011 and left 02-11-2012   History of recurrent UTIs    History of venereal warts    Hyperlipidemia    Hypertension    Infantile paralysis    Major depression    PMB (postmenopausal bleeding)    S/P removal of thyroid  nodule 01/14/2017   Type 2 diabetes mellitus (HCC) 03/31/2016   not on meds currently   Past Surgical History:  Procedure Laterality Date   BREAST BIOPSY Right 11/10/2020   BREAST  BIOPSY Left 2007   BUNIONECTOMY WITH HAMMERTOE RECONSTRUCTION Bilateral right 07/ 2013;  left 11/ 2013   CATARACT EXTRACTION W/ INTRAOCULAR LENS  IMPLANT, BILATERAL  right 09-24-2011;  left 02-11-2012   [facility]   and both eye had corneal transplant DSAEK at same time cataract extraction   COLONOSCOPY     DILATATION & CURETTAGE/HYSTEROSCOPY WITH MYOSURE N/A 06/24/2016   Procedure: DILATATION & CURETTAGE/HYSTEROSCOPY WITH MYOSURE;  Surgeon: Concepcion Deck, MD;  Location: [facility];  Service: Gynecology;  Laterality: N/A;   TUBAL LIGATION  1980's   Family History  Problem Relation Age of Onset   Diabetes Mother    Breast cancer Mother 12       dx late 43's, was in remission but had relapse with mets. Died at 48   Heart attack Father    Seizures Father    Heart disease Father        CAD/MI-fatal   Breast cancer Sister 11       dx early 7's, BRCA2+, now is 48   Breast cancer Sister 60       dx. in 40's, BRCA2-,    Cancer Maternal Grandfather        Lung Cancer   Breast cancer Maternal Grandfather    Prostate cancer Maternal Uncle 58       dx and died in his 22's   Colon cancer Maternal Aunt 10       dx in 64's, had 12 in of colon removed, died in her 20's   Alzheimer's disease Paternal Aunt    Breast cancer Other 35       BRCA2 status unk (did not share result with famiy)   Kidney cancer Other 35       dx in 31's, is no in his 59's   Breast cancer Cousin 65   Pancreatic cancer Cousin 70       died in 18's   Lung cancer Maternal Uncle 3       dx 60's/70's, now is 60   Stomach cancer Cousin 40       died at 68   Cancer Cousin 80       dx and died in 59's. type of cancer unk   Throat cancer Cousin 65       died in 50's   Cancer Cousin        dx age unk, died in 39's/60's. type of CA unk   Cancer Cousin        dx age unk, died in 11's. type of CA unk   Cancer Cousin        age dx unk, died in 34's   Social History   Socioeconomic History   Marital  status: Legally Separated    Spouse name: Not on file   Number of children: 2   Years of education:  20   Highest education level: Not on file  Occupational History   Occupation: educator  Tobacco Use   Smoking status: Never   Smokeless tobacco: Never  Vaping Use   Vaping status: Never Used  Substance and Sexual Activity   Alcohol use: No   Drug use: No   Sexual activity: Not Currently  Other Topics Concern   Not on file  Social History Narrative   HSG, College Grad, [place], [place]; [employer] [place]  MEd; [employer]- education. Married '71-seperated-'06.1 son '71, 1 daughter-'78; 2 grandchildren. 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 Daughter had a baby Jun 11, 2008, @nd  Aug 12th,'11. Lives in [place]. Not sexually active   Social Drivers of Health   Financial Resource Strain: Low Risk  (02/24/2023)   Overall Financial Resource Strain (CARDIA)    Difficulty of Paying Living Expenses: Not hard at all  Food Insecurity: No Food Insecurity (02/24/2023)   Hunger Vital Sign    Worried About Running Out of Food in the Last Year: Never true    Ran Out of Food in the Last Year: Never true  Transportation Needs: No Transportation Needs (02/24/2023)   PRAPARE - Administrator, Civil Service (Medical): No    Lack of Transportation (Non-Medical): No  Physical Activity: Sufficiently Active (02/24/2023)   Exercise Vital Sign    Days of Exercise per Week: 7 days    Minutes of Exercise per Session: 30 min  Stress: No Stress Concern Present (02/24/2023)   [employer] of Occupational Health - Occupational Stress Questionnaire    Feeling of Stress : Not at all  Social Connections: Moderately Integrated (02/24/2023)   Social Connection and Isolation Panel [NHANES]    Frequency of Communication with Friends and Family: More than three times a week    Frequency of Social Gatherings with Friends and Family: More than three times a week    Attends Religious  Services: More than 4 times per year    Active Member of [employer] or Organizations: Yes    Attends Engineer, structural: More than 4 times per year    Marital Status: Separated    Tobacco Counseling Counseling given: Not Answered   Clinical Intake:  Pre-visit preparation completed: Yes  Pain : No/denies pain     BMI - recorded: 29.12 Nutritional Risks: None Diabetes: Yes CBG done?: No Did pt. bring in CBG monitor from home?: No  How often do you need to have someone help you when you read instructions, pamphlets, or other written materials from your doctor or pharmacy?: 1 - Never  Interpreter Needed?: No  Information entered by :: Kandy Orris, CMA   Activities of Daily Living    02/24/2023    2:51 PM  In your present state of health, do you have any difficulty performing the following activities:  Hearing? 0  Vision? 0  Difficulty concentrating or making decisions? 0  Walking or climbing stairs? 0  Dressing or bathing? 0  Doing errands, shopping? 0  Preparing Food and eating ? N  Using the Toilet? N  In the past six months, have you accidently leaked urine? Y  Comment wears pads - Gynecologist follows  Do you have problems with loss of bowel control? N  Managing your Medications? N  Managing your Finances? N  Housekeeping or managing your Housekeeping? N    Patient Care Team: Adelia Homestead, MD as PCP - General (Internal Medicine) Timothy Ford, MD as Consulting  Physician (Orthopedic Surgery) Shamleffer, Julian Obey, MD as Consulting Physician (Endocrinology) Concepcion Deck, MD as Consulting Physician (Obstetrics and Gynecology) Sherma Diver, MD as Referring Physician (Ophthalmology)  Indicate any recent Medical Services you may have received from other than [facility] providers in the past year (date may be approximate).     Assessment:   This is a routine wellness examination for Anna Cooper.  Hearing/Vision  screen Hearing Screening - Comments:: Denies hearing difficulties   Vision Screening - Comments:: Wears rx glasses - up to date with routine eye exams with  Dr Arlene Ben of [facility]   Goals Addressed               This Visit's Progress     Increase physical activity (pt-stated)        Patient stated plans to exercise more and lower Hgb A1C level.       Depression Screen    02/24/2023    2:57 PM 02/21/2022    3:09 PM 12/21/2021    8:15 AM 02/20/2021    3:50 PM 02/18/2020    8:58 AM 10/23/2018    9:17 AM 07/01/2017    9:58 AM  PHQ 2/9 Scores  PHQ - 2 Score 0 0 0 0 0 0 0  PHQ- 9 Score   3        Fall Risk    02/24/2023    2:52 PM 12/24/2022    1:58 PM 02/21/2022    3:05 PM 12/21/2021    8:15 AM 02/20/2021    3:45 PM  Fall Risk   Falls in the past year? 0 0 0 0 0  Number falls in past yr: 0 0 0 0 0  Injury with Fall? 0 0 0 0 0  Risk for fall due to : No Fall Risks  No Fall Risks No Fall Risks No Fall Risks  Follow up Falls prevention discussed;Falls evaluation completed Falls evaluation completed Falls prevention discussed Falls evaluation completed Falls evaluation completed    MEDICARE RISK AT HOME: Medicare Risk at Home Any stairs in or around the home?: Yes If so, are there any without handrails?: No Home free of loose throw rugs in walkways, pet beds, electrical cords, etc?: Yes Adequate lighting in your home to reduce risk of falls?: Yes Life alert?: No Use of a cane, walker or w/c?: No Grab bars in the bathroom?: Yes Shower chair or bench in shower?: No Elevated toilet seat or a handicapped toilet?: Yes  TIMED UP AND GO:  Was the test performed?  No    Cognitive Function:    08/29/2015    8:35 AM  MMSE - Mini Mental State Exam  Orientation to time 5  Orientation to Place 5  Registration 3  Attention/ Calculation 5  Recall 3  Language- name 2 objects 2  Language- repeat 1  Language- follow 3 step command 3  Language- read & follow direction 1  Write a  sentence 1  Copy design 1  Total score 30        02/24/2023    2:54 PM 02/21/2022    3:05 PM  6CIT Screen  What Year? 0 points 0 points  What month? 0 points 0 points  What time? 0 points 0 points  Count back from 20 0 points 0 points  Months in reverse 0 points 0 points  Repeat phrase 0 points 0 points  Total Score 0 points 0 points    Immunizations Immunization History  Administered Date(s) Administered   Fluad Quad(high Dose 65+) 10/23/2018, 12/21/2021   Influenza Split 11/06/2011   Influenza, High Dose Seasonal PF 09/28/2015, 10/11/2016, 10/21/2017   Influenza,inj,Quad PF,6+ Mos 10/28/2012, 11/16/2013   Influenza-Unspecified 10/15/2014, 11/02/2019, 11/03/2020, 11/20/2022   PFIZER(Purple Top)SARS-COV-2 Vaccination 02/02/2019, 02/22/2019, 11/30/2019, 06/22/2020   Pfizer Covid-19 Vaccine Bivalent Booster 29yrs & up 11/03/2020   Pfizer(Comirnaty)Fall Seasonal Vaccine 12 years and older 12/25/2021   Pneumococcal Conjugate-13 01/25/2013   Pneumococcal Polysaccharide-23 11/06/2011, 10/21/2017   Rsv, Bivalent, Protein Subunit Rsvpref,pf Pattricia Bores) 11/20/2022   Tdap 11/11/2011   Unspecified SARS-COV-2 Vaccination 02/02/2019, 02/22/2019, 11/30/2019, 06/22/2020   Zoster Recombinant(Shingrix) 10/21/2017, 12/25/2017   Zoster, Live 11/11/2011    TDAP status: Due, Education has been provided regarding the importance of this vaccine. Advised may receive this vaccine at local pharmacy or Health Dept. Aware to provide a copy of the vaccination record if obtained from local pharmacy or Health Dept. Verbalized acceptance and understanding.Last injection on 11/11/2011.  Flu Vaccine status: Up to date - 11/20/22  Pneumococcal vaccine status: Up to date - 10/21/2017  Covid-19 vaccine status: Information provided on how to obtain vaccines.   Qualifies for Shingles Vaccine? Yes   Zostavax completed Yes   Shingrix Completed?: Yes  Screening Tests Health Maintenance  Topic Date Due    DTaP/Tdap/Td (2 - Td or Tdap) 11/10/2021   OPHTHALMOLOGY EXAM  09/14/2022   COVID-19 Vaccine (11 - 2024-25 season) 09/15/2022   HEMOGLOBIN A1C  02/06/2023   Diabetic kidney evaluation - eGFR measurement  12/24/2023   Diabetic kidney evaluation - Urine ACR  12/24/2023   FOOT EXAM  12/24/2023   MAMMOGRAM  12/25/2023   Medicare Annual Wellness (AWV)  02/24/2024   Colonoscopy  07/16/2026   Pneumonia Vaccine 67+ Years old  Completed   INFLUENZA VACCINE  Completed   DEXA SCAN  Completed   Hepatitis C Screening  Completed   Zoster Vaccines- Shingrix  Completed   HPV VACCINES  Aged Out    Health Maintenance  Health Maintenance Due  Topic Date Due   DTaP/Tdap/Td (2 - Td or Tdap) 11/10/2021   OPHTHALMOLOGY EXAM  09/14/2022   COVID-19 Vaccine (11 - 2024-25 season) 09/15/2022   HEMOGLOBIN A1C  02/06/2023    Colorectal cancer screening: Type of screening: Colonoscopy. Completed 07/15/2016. Repeat every 10 years  Mammogram status: Completed 12/25/22. Repeat every year - scheduled for 04/14/2023.  Gynecologist - Pt is being monitored by Dr Aline Apo for urinary incontinence (leakage).  Bone Density status: Completed 04/24/2020. Results reflect: Bone density results: OSTEOPENIA. Repeat every 2-3 years.   Additional Screening:  Hepatitis C Screening: does qualify; Completed 09/28/2015  Vision Screening: Recommended annual ophthalmology exams for early detection of glaucoma and other disorders of the eye. Is the patient up to date with their annual eye exam?  Yes  Who is the provider or what is the name of the office in which the patient attends annual eye exams? Dr Arlene Ben [facility] If pt is not established with a provider, would they like to be referred to a provider to establish care? No .   Dental Screening: Recommended annual dental exams for proper oral hygiene  Diabetic Foot Exam: Diabetic Foot Exam: Completed 12/24/22.  Next appt w/PCP on 12/25 and  has an appt w/Dr Bobbie Burows (Podiatrist) on 03/06/2023.  Community Resource Referral / Chronic Care Management: CRR required this visit?  No   CCM required this visit?  No     Plan:  I have personally reviewed and noted the following in the patient's chart:   Medical and social history Use of alcohol, tobacco or illicit drugs  Current medications and supplements including opioid prescriptions. Patient is not currently taking opioid prescriptions. Functional ability and status Nutritional status Physical activity Advanced directives List of other physicians Hospitalizations, surgeries, and ER visits in previous 12 months Vitals Screenings to include cognitive, depression, and falls Referrals and appointments  In addition, I have reviewed and discussed with patient certain preventive protocols, quality metrics, and best practice recommendations. A written personalized care plan for preventive services as well as general preventive health recommendations were provided to patient.     Patria Bookbinder, CMA   02/24/2023   After Visit Summary: (MyChart) Due to this being a telephonic visit, the after visit summary with patients personalized plan was offered to patient via MyChart   Nurse Notes: Order placed for repeat DEXA scan.  Requested Advance Directives from pt for chart.

## 2023-02-24 NOTE — Patient Instructions (Addendum)
 Anna Cooper , Thank you for taking time to come for your Medicare Wellness Visit. I appreciate your ongoing commitment to your health goals. Please review the following plan we discussed and let me know if I can assist you in the future.   Referrals/Orders/Follow-Ups/Clinician Recommendations: Aim for 30 minutes of exercise or brisk walking, 6-8 glasses of water, and 5 servings of fruits and vegetables each day. Patient will obtain a copy of RSV and Covid vaccines list for update of chart for PCP.  Repeat DEXA scan ordered.  This is a list of the screening recommended for you and due dates:  Health Maintenance  Topic Date Due   DTaP/Tdap/Td vaccine (2 - Td or Tdap) 11/10/2021   Eye exam for diabetics  09/14/2022   COVID-19 Vaccine (11 - 2024-25 season) 09/15/2022   Hemoglobin A1C  02/06/2023   Yearly kidney function blood test for diabetes  12/24/2023   Yearly kidney health urinalysis for diabetes  12/24/2023   Complete foot exam   12/24/2023   Mammogram  12/25/2023   Medicare Annual Wellness Visit  02/24/2024   Colon Cancer Screening  07/16/2026   Pneumonia Vaccine  Completed   Flu Shot  Completed   DEXA scan (bone density measurement)  Completed   Hepatitis C Screening  Completed   Zoster (Shingles) Vaccine  Completed   HPV Vaccine  Aged Out    Advanced directives: (Copy Requested) Please bring a copy of your health care power of attorney and living will to the office to be added to your chart at your convenience.  Next Medicare Annual Wellness Visit scheduled for next year: Yes - 02/25/2024

## 2023-02-25 ENCOUNTER — Ambulatory Visit: Payer: Medicare PPO | Admitting: Physical Therapy

## 2023-02-25 DIAGNOSIS — R279 Unspecified lack of coordination: Secondary | ICD-10-CM

## 2023-02-25 DIAGNOSIS — R293 Abnormal posture: Secondary | ICD-10-CM | POA: Diagnosis not present

## 2023-02-25 DIAGNOSIS — M6281 Muscle weakness (generalized): Secondary | ICD-10-CM

## 2023-02-25 NOTE — Therapy (Signed)
OUTPATIENT PHYSICAL THERAPY FEMALE PELVIC TREATMENT   Patient Name: Anna Cooper MRN: [id number] DOB:1950/04/30, 73 y.o., female Today's Date: 02/25/2023  END OF SESSION:  PT End of Session - 02/25/23 1032     Visit Number 4    Date for PT Re-Evaluation 03/28/23    Authorization Type humana MCR    Progress Note Due on Visit 10    PT Start Time 1028   arrival   PT Stop Time 1100    PT Time Calculation (min) 32 min    Activity Tolerance Patient tolerated treatment well    Behavior During Therapy [facility] for tasks assessed/performed             Past Medical History:  Diagnosis Date   Fuchs' corneal dystrophy    s/p  bilateral corneal transplant at [facility] -- right 09-24-2011 and left 02-11-2012   History of recurrent UTIs    History of venereal warts    Hyperlipidemia    Hypertension    Infantile paralysis    Major depression    PMB (postmenopausal bleeding)    S/P removal of thyroid nodule 01/14/2017   Type 2 diabetes mellitus (HCC) 03/31/2016   not on meds currently   Past Surgical History:  Procedure Laterality Date   BREAST BIOPSY Right 11/10/2020   BREAST BIOPSY Left 2007   BUNIONECTOMY WITH HAMMERTOE RECONSTRUCTION Bilateral right 07/ 2013;  left 11/ 2013   CATARACT EXTRACTION W/ INTRAOCULAR LENS  IMPLANT, BILATERAL  right 09-24-2011;  left 02-11-2012   [facility]   and both eye had corneal transplant DSAEK at same time cataract extraction   COLONOSCOPY     DILATATION & CURETTAGE/HYSTEROSCOPY WITH MYOSURE N/A 06/24/2016   Procedure: DILATATION & CURETTAGE/HYSTEROSCOPY WITH MYOSURE;  Surgeon: Ranae Pila, MD;  Location: [facility] [place];  Service: Gynecology;  Laterality: N/A;   TUBAL LIGATION  1980's   Patient Active Problem List   Diagnosis Date Noted   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 08/06/2022   Tinnitus of both ears 12/15/2020   Bilateral hearing loss 12/15/2020   Elevated alkaline phosphatase level 11/06/2020   Osteopenia 10/13/2020   At high  risk for breast cancer 10/05/2018   Thyroid mass 01/03/2017   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   Genetic testing 08/30/2016   Fuchs' corneal dystrophy 07/15/2012   Pseudophakia of both eyes 07/15/2012   Routine health maintenance 12/08/2010   Type 2 diabetes with complication (HCC) 11/25/2008   HSV infection 03/10/2007   Hyperlipidemia associated with type 2 diabetes mellitus (HCC) 03/10/2007   Hypertension 03/10/2007    PCP: Myrlene Broker, MD   REFERRING PROVIDER: Ranae Pila, MD   REFERRING DIAG: R32 (ICD-10-CM) - Unspecified urinary incontinence  THERAPY DIAG:  Muscle weakness (generalized)  Unspecified lack of coordination  Rationale for Evaluation and Treatment: Rehabilitation  ONSET DATE: 4-5 years  SUBJECTIVE:  SUBJECTIVE STATEMENT: Now using size 4 pads at home now still only change 1-2x daily. But uses 8s out of the house due to fear of leakage. No longer getting up at night and making it around 3 hours.  Fluid intake: Yes: water - not a lot, 2-3 cups of coffee daily, 2-3 bottles of teas daily.     PAIN:  Are you having pain? No   PRECAUTIONS: None  RED FLAGS: None   WEIGHT BEARING RESTRICTIONS: No  FALLS:  Has patient fallen in last 6 months? No  LIVING ENVIRONMENT: Lives with: lives alone Lives in: House/apartment   OCCUPATION: tutoring elementary reading and math  PLOF: Independent  PATIENT GOALS: to have less leakage  PERTINENT HISTORY:  Osteopenia, Type 2 diabetes with complication , HSV, HTN Sexual abuse: No  BOWEL MOVEMENT: Pain with bowel movement: No Type of bowel movement:Type (Bristol Stool Scale) 4, Frequency daily, and Strain No Fully empty rectum: Yes:   Leakage: No Pads: No Fiber supplement:  No  URINATION: Pain with urination: No Fully empty bladder: Yes:   Stream: Strong Urgency: Yes:   Frequency: about 2 hours, at night 2x Leakage: Urge to void and Walking to the bathroom Pads: Yes: 4-5 changes daily size 8   INTERCOURSE: Pain with intercourse:  not painful Ability to have vaginal penetration:  Yes:   Climax: not painful  Marinoff Scale: 0/3  PREGNANCY: Vaginal deliveries 2 Tearing No C-section deliveries 0 Currently pregnant No  PROLAPSE: None   OBJECTIVE:  Note: Objective measures were completed at Evaluation unless otherwise noted.  DIAGNOSTIC FINDINGS:   PATIENT SURVEYS:    Urogenital Distress Inventory (UDI-6 Short Form) Score = 25  COGNITION: Overall cognitive status: Within functional limits for tasks assessed     SENSATION: Light touch: Appears intact Proprioception: Appears intact  MUSCLE LENGTH: Bil hamstrings and adductors limited by 25%   POSTURE: rounded shoulders  PELVIC ALIGNMENT: WFL  LUMBARAROM/PROM:  A/PROM A/PROM  eval  Flexion Limited by 25%  Extension WFL  Right lateral flexion Limited by 25%  Left lateral flexion Limited by 25%  Right rotation Limited by 25%  Left rotation Limited by 25%   (Blank rows = not tested)  LOWER EXTREMITY ROM:  WFL  LOWER EXTREMITY MMT:  Bil hips grossly 4/5; knees 5/5  PALPATION:   General  no TTP throughout abdomen, tight lumbar paraspinals and gluteals                External Perineal Exam redness at mid vulva and labia minora bil, no pain                             Internal Pelvic Floor no TTP  Patient confirms identification and approves PT to assess internal pelvic floor and treatment Yes No emotional/communication barriers or cognitive limitation. Patient is motivated to learn. Patient understands and agrees with treatment goals and plan. PT explains patient will be examined in standing, sitting, and lying down to see how their muscles and joints work. When they are  ready, they will be asked to remove their underwear so PT can examine their perineum. The patient is also given the option of providing their own chaperone as one is not provided in our facility. The patient also has the right and is explained the right to defer or refuse any part of the evaluation or treatment including the internal exam. With the patient's consent, PT will use one gloved  finger to gently assess the muscles of the pelvic floor, seeing how well it contracts and relaxes and if there is muscle symmetry. After, the patient will get dressed and PT and patient will discuss exam findings and plan of care. PT and patient discuss plan of care, schedule, attendance policy and HEP activities.  PELVIC MMT:   MMT eval  Vaginal 3/5, 4s, 6 reps  Internal Anal Sphincter   External Anal Sphincter   Puborectalis   Diastasis Recti   (Blank rows = not tested)        TONE: WFL  PROLAPSE: Not seen in hooklying with cough  TODAY'S TREATMENT:                                                                                                                              DATE:   02/04/23:  There Act: Pt educated on urge drill, bladder irritants  NMRE: all exercises cued  for exhale and pelvic floor contraction with activity for improved coordination of muscle activation and decreased stress at pelvic floor for decreased leakage.   Opposite hand/knee ball press 2x10 Hooklying ball squeeze 2x10 Sidelying ball press with hip abduction 2x10 each Farmers carry 10# x10 (one each hand 1000') 2x10 Sit to stand 10#   02/18/23  NMRE: all exercises cued  for exhale and pelvic floor contraction with activity for improved coordination of muscle activation and decreased stress at pelvic floor for decreased leakage.   x10 Sit to stand 10#, x10 butt taps 10# 2x01 opp hand/knee ball press seated 2x10 mario punches 2# Airex step ups +alt march then step down  Farmer's carry 10# + 4# 1000', switched hands then  again Urge drill x5 with increased pace of contractions and transfer into standing  02/25/23: Opposite hand/knee ball press 2x10 2x10 Sit to stand 10#, 2x5 15# Farmers carry Rt hand 15# 1000' switched hands Bosu ball alt forward weight shifts 2x10 each, wide squats with one leg on bosu x10 each   PATIENT EDUCATION:  Education details: [id number] Person educated: Patient Education method: Programmer, multimedia, Demonstration, Tactile cues, Verbal cues, and Handouts Education comprehension: verbalized understanding and returned demonstration  HOME EXERCISE PROGRAM: [id number]  ASSESSMENT:  CLINICAL IMPRESSION: Patient returned for treatment today, reports continued improvement now only needing 1-2 pads a day now and using size 4 at home, no nighttime urination and around 3 hours. Tolerated session well. Progressed with strengthening with standing and increased weighted resistance for increased challenge of coordinating pelvic floor with standing activity. Did benefit from cues intermittently for coordination of pelvic floor and breathing with activity to decreased strain at pelvic floor with exercises. But improving compared to previous sessions. Pt would benefit from additional PT to further address deficits.  Pt has next appointment in one month and would like to self monitor continued progress and then plan for DC next visit as long as no return of symptoms and continued progress.   OBJECTIVE IMPAIRMENTS: decreased coordination,  decreased endurance, decreased mobility, decreased strength, increased muscle spasms, impaired flexibility, improper body mechanics, and postural dysfunction.   ACTIVITY LIMITATIONS: continence  PARTICIPATION LIMITATIONS: community activity  PERSONAL FACTORS: Time since onset of injury/illness/exacerbation are also affecting patient's functional outcome.   REHAB POTENTIAL: Good  CLINICAL DECISION MAKING: Stable/uncomplicated  EVALUATION COMPLEXITY:  Low   GOALS: Goals reviewed with patient? Yes  SHORT TERM GOALS: Target date: 02/25/23 Updated 02/18/23  Pt to be I with HEP.  Baseline: Goal status: MET  2.  Pt to demonstrate improved coordination of pelvic floor and breathing mechanics with body weight squat with appropriate synergistic patterns to decrease pain and leakage at least 50% of the time.    Baseline:  Goal status: MET  3.  Pt to be I with urge drill for decreased urinary incontinence with not making it to bathroom quickly enough.  Baseline:  Goal status: MET  4.  Pt to be I with pressure management to decrease stress at bladder with attempts at holding urine.  Baseline:  Goal status: MET   LONG TERM GOALS: Target date: 03/28/23  Pt to be I with advanced HEP.  Baseline:  Goal status: MET  2.  Pt to demonstrate improved coordination of pelvic floor and breathing mechanics with 10# squat with appropriate synergistic patterns to decrease pain and leakage at least 75% of the time.    Baseline:  Goal status: on going  3.  Pt will have 25% less urgency due to bladder retraining and strengthening  Baseline:  Goal status: MET  4.  Pt with demonstrate full ROM of trunk in all directions for decreased stress at pelvic floor and leakage.  Baseline:  Goal status: on going  5.  Pt to report no more than one instance of urinary incontinence in a week for improved QOL.  Baseline:  Goal status: on going  6.  Pt to demonstrate at least 5/5 bil hip strength for improved pelvic stability and functional squats without leakage.  Baseline:  Goal status: on going  PLAN:  PT FREQUENCY: 1x/week  PT DURATION:  8 sessions  PLANNED INTERVENTIONS: 97110-Therapeutic exercises, 97530- Therapeutic activity, 97112- Neuromuscular re-education, 97535- Self Care, [id number]- Manual therapy, Patient/Family education, Taping, Dry Needling, Joint mobilization, Spinal mobilization, Scar mobilization, Cryotherapy, Moist heat, and  Biofeedback  PLAN FOR NEXT SESSION: coordination of pelvic floor with strengthening, and hip and core strengthening, pelvic floor strengthening   Otelia Sergeant, PT, DPT 02/24/2510:38 PM

## 2023-03-04 DIAGNOSIS — Z79899 Other long term (current) drug therapy: Secondary | ICD-10-CM | POA: Diagnosis not present

## 2023-03-05 LAB — HEPATIC FUNCTION PANEL
ALT: 25 [IU]/L (ref 0–32)
AST: 33 [IU]/L (ref 0–40)
Albumin: 4.4 g/dL (ref 3.8–4.8)
Alkaline Phosphatase: 127 [IU]/L — ABNORMAL HIGH (ref 44–121)
Bilirubin Total: <0.2 mg/dL (ref 0.0–1.2)
Bilirubin, Direct: 0.08 mg/dL (ref 0.00–0.40)
Total Protein: 7.2 g/dL (ref 6.0–8.5)

## 2023-03-06 ENCOUNTER — Ambulatory Visit: Payer: Medicare PPO | Admitting: Podiatry

## 2023-03-06 ENCOUNTER — Encounter: Payer: Self-pay | Admitting: Podiatry

## 2023-03-06 DIAGNOSIS — B351 Tinea unguium: Secondary | ICD-10-CM | POA: Diagnosis not present

## 2023-03-06 NOTE — Progress Notes (Signed)
 Subjective: Chief Complaint  Patient presents with   Nail Problem    RM#13  Follow up on nail fungus patient states doing better.   73 year old female presents the office for follow-up evaluation of nail fungus. She has been using the topical medication and she thinks it is helping.  No swelling or redness or injury to the toenail sites.  No open lesions no other concerns.  Objective: AAO x3, NAD DP/PT pulses palpable bilaterally, CRT less than 3 seconds There does appear to be some clearing on the proximal nail folds and does appear to be improving overall. She has no pain to the nail currently no swelling redness or any drainage.  No open lesions noted. No open lesions. No pain with calf compression, swelling, warmth, erythema  Assessment: Onychomycosis  Plan: -All treatment options discussed with the patient including all alternatives, risks, complications.  -Sharply debrided the nails x 10 without any complications or bleeding.  Due to blood work we will continue with the topical medication. Follow up with her PCP for the increased alkaline phosphatase.  Return in about 3 months (around 06/03/2023) for nail fungus/trim.  Vivi Barrack DPM

## 2023-03-27 ENCOUNTER — Ambulatory Visit: Payer: Medicare PPO | Attending: Obstetrics and Gynecology | Admitting: Physical Therapy

## 2023-03-27 DIAGNOSIS — R293 Abnormal posture: Secondary | ICD-10-CM | POA: Insufficient documentation

## 2023-03-27 DIAGNOSIS — M6281 Muscle weakness (generalized): Secondary | ICD-10-CM | POA: Diagnosis not present

## 2023-03-27 DIAGNOSIS — R279 Unspecified lack of coordination: Secondary | ICD-10-CM | POA: Insufficient documentation

## 2023-03-27 NOTE — Therapy (Signed)
 OUTPATIENT PHYSICAL THERAPY FEMALE PELVIC TREATMENT   Patient Name: Anna Cooper MRN: [id number] DOB:September 07, 1950, 73 y.o., female Today's Date: 03/27/2023  END OF SESSION:  PT End of Session - 03/27/23 1018     Visit Number 5    Date for PT Re-Evaluation 03/28/23    Authorization Type humana MCR    Progress Note Due on Visit 10    PT Start Time 1015    PT Stop Time 1054    PT Time Calculation (min) 39 min    Activity Tolerance Patient tolerated treatment well    Behavior During Therapy [facility] for tasks assessed/performed             Past Medical History:  Diagnosis Date   Fuchs' corneal dystrophy    s/p  bilateral corneal transplant at [facility] -- right 09-24-2011 and left 02-11-2012   History of recurrent UTIs    History of venereal warts    Hyperlipidemia    Hypertension    Infantile paralysis    Major depression    PMB (postmenopausal bleeding)    S/P removal of thyroid nodule 01/14/2017   Type 2 diabetes mellitus (HCC) 03/31/2016   not on meds currently   Past Surgical History:  Procedure Laterality Date   BREAST BIOPSY Right 11/10/2020   BREAST BIOPSY Left 2007   BUNIONECTOMY WITH HAMMERTOE RECONSTRUCTION Bilateral right 07/ 2013;  left 11/ 2013   CATARACT EXTRACTION W/ INTRAOCULAR LENS  IMPLANT, BILATERAL  right 09-24-2011;  left 02-11-2012   [facility]   and both eye had corneal transplant DSAEK at same time cataract extraction   COLONOSCOPY     DILATATION & CURETTAGE/HYSTEROSCOPY WITH MYOSURE N/A 06/24/2016   Procedure: DILATATION & CURETTAGE/HYSTEROSCOPY WITH MYOSURE;  Surgeon: Ranae Pila, MD;  Location: [facility];  Service: Gynecology;  Laterality: N/A;   TUBAL LIGATION  1980's   Patient Active Problem List   Diagnosis Date Noted   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 08/06/2022   Tinnitus of both ears 12/15/2020   Bilateral hearing loss 12/15/2020   Elevated alkaline phosphatase level 11/06/2020   Osteopenia 10/13/2020   At high risk for  breast cancer 10/05/2018   Thyroid mass 01/03/2017   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   Genetic testing 08/30/2016   Fuchs' corneal dystrophy 07/15/2012   Pseudophakia of both eyes 07/15/2012   Routine health maintenance 12/08/2010   Type 2 diabetes with complication (HCC) 11/25/2008   HSV infection 03/10/2007   Hyperlipidemia associated with type 2 diabetes mellitus (HCC) 03/10/2007   Hypertension 03/10/2007    PCP: Myrlene Broker, MD   REFERRING PROVIDER: Ranae Pila, MD   REFERRING DIAG: R32 (ICD-10-CM) - Unspecified urinary incontinence  THERAPY DIAG:  Muscle weakness (generalized)  Unspecified lack of coordination  Abnormal posture  Rationale for Evaluation and Treatment: Rehabilitation  ONSET DATE: 4-5 years  SUBJECTIVE:  SUBJECTIVE STATEMENT: Now using size 4 pads when out of the house sometimes will be dry all day and sometimes may need to change once and liners at home now still only change 2-3x daily.   Fluid intake: Yes: water - not a lot, 2-3 cups of coffee daily, 2-3 bottles of teas daily.     PAIN:  Are you having pain? No   PRECAUTIONS: None  RED FLAGS: None   WEIGHT BEARING RESTRICTIONS: No  FALLS:  Has patient fallen in last 6 months? No  LIVING ENVIRONMENT: Lives with: lives alone Lives in: House/apartment   OCCUPATION: tutoring elementary reading and math  PLOF: Independent  PATIENT GOALS: to have less leakage  PERTINENT HISTORY:  Osteopenia, Type 2 diabetes with complication , HSV, HTN Sexual abuse: No  BOWEL MOVEMENT: Pain with bowel movement: No Type of bowel movement:Type (Bristol Stool Scale) 4, Frequency daily, and Strain No Fully empty rectum: Yes:   Leakage: No Pads: No Fiber supplement:  No  URINATION: Pain with urination: No Fully empty bladder: Yes:   Stream: Strong Urgency: Yes:   Frequency: about 2 hours, at night 2x Leakage: Urge to void and Walking to the bathroom Pads: Yes: 4-5 changes daily size 8   INTERCOURSE: Pain with intercourse:  not painful Ability to have vaginal penetration:  Yes:   Climax: not painful  Marinoff Scale: 0/3  PREGNANCY: Vaginal deliveries 2 Tearing No C-section deliveries 0 Currently pregnant No  PROLAPSE: None   OBJECTIVE:  Note: Objective measures were completed at Evaluation unless otherwise noted.  DIAGNOSTIC FINDINGS:   PATIENT SURVEYS:    Urogenital Distress Inventory (UDI-6 Short Form) Score = 25  COGNITION: Overall cognitive status: Within functional limits for tasks assessed     SENSATION: Light touch: Appears intact Proprioception: Appears intact  MUSCLE LENGTH: Bil hamstrings and adductors limited by 25%   POSTURE: rounded shoulders  PELVIC ALIGNMENT: WFL  LUMBARAROM/PROM:  A/PROM A/PROM  eval  Flexion Limited by 25%  Extension WFL  Right lateral flexion Limited by 25%  Left lateral flexion Limited by 25%  Right rotation Limited by 25%  Left rotation Limited by 25%   (Blank rows = not tested)  LOWER EXTREMITY ROM:  WFL  LOWER EXTREMITY MMT:  Bil hips grossly 4/5; knees 5/5  PALPATION:   General  no TTP throughout abdomen, tight lumbar paraspinals and gluteals                External Perineal Exam redness at mid vulva and labia minora bil, no pain                             Internal Pelvic Floor no TTP  Patient confirms identification and approves PT to assess internal pelvic floor and treatment Yes No emotional/communication barriers or cognitive limitation. Patient is motivated to learn. Patient understands and agrees with treatment goals and plan. PT explains patient will be examined in standing, sitting, and lying down to see how their muscles and joints work. When they are  ready, they will be asked to remove their underwear so PT can examine their perineum. The patient is also given the option of providing their own chaperone as one is not provided in our facility. The patient also has the right and is explained the right to defer or refuse any part of the evaluation or treatment including the internal exam. With the patient's consent, PT will use one gloved finger to gently  assess the muscles of the pelvic floor, seeing how well it contracts and relaxes and if there is muscle symmetry. After, the patient will get dressed and PT and patient will discuss exam findings and plan of care. PT and patient discuss plan of care, schedule, attendance policy and HEP activities.  PELVIC MMT:   MMT eval  Vaginal 3/5, 4s, 6 reps  Internal Anal Sphincter   External Anal Sphincter   Puborectalis   Diastasis Recti   (Blank rows = not tested)        TONE: WFL  PROLAPSE: Not seen in hooklying with cough  TODAY'S TREATMENT:                                                                                                                              DATE:   02/18/23  NMRE: all exercises cued  for exhale and pelvic floor contraction with activity for improved coordination of muscle activation and decreased stress at pelvic floor for decreased leakage.   x10 Sit to stand 10#, x10 butt taps 10# 2x01 opp hand/knee ball press seated 2x10 mario punches 2# Airex step ups +alt march then step down  Farmer's carry 10# + 4# 1000', switched hands then again Urge drill x5 with increased pace of contractions and transfer into standing  02/25/23: Opposite hand/knee ball press 2x10 2x10 Sit to stand 10#, 2x5 15# Farmers carry Rt hand 15# 1000' switched hands Bosu ball alt forward weight shifts 2x10 each, wide squats with one leg on bosu x10 each  03/27/23  Bridges with green band hip abduction 2x10 Pelvic floor contraction + hip flexion green band 2x10 2x10 Sit to stand from  13# Farmers carry 13# 2x10 standing march  Sitting opp hand/knee press 2x10 each Pt and PT discussed goals, importance of continuing HEP and urge drill consistently as pt reports she sometimes does this but not always and does have leakage with she does not do it.  Pt declined internal reassessment today.  PATIENT EDUCATION:  Education details: [id number] Person educated: Patient Education method: Programmer, multimedia, Demonstration, Tactile cues, Verbal cues, and Handouts Education comprehension: verbalized understanding and returned demonstration  HOME EXERCISE PROGRAM: [id number]  ASSESSMENT:  CLINICAL IMPRESSION: Patient returned for treatment today, reports continued improvement now changing liners 3x daily if at home all day, 1 size 4 pad maybe 2-3x weekly when out of the house running errands. Pt reports feeling about 85% better and pleased with progress. Asked about medications for remaining leakage and educated to ask doctor about this, she agreed. Pt tolerated exercises well. Did decline internal reassessment of pelvic floor today stating she feels better and did not want to complete this. Pt understands she would need new referral for future PT needs, agreeable to DC today.   OBJECTIVE IMPAIRMENTS: decreased coordination, decreased endurance, decreased mobility, decreased strength, increased muscle spasms, impaired flexibility, improper body mechanics, and postural dysfunction.   ACTIVITY LIMITATIONS: continence  PARTICIPATION LIMITATIONS: community activity  PERSONAL FACTORS: Time since onset of injury/illness/exacerbation are also affecting patient's functional outcome.   REHAB POTENTIAL: Good  CLINICAL DECISION MAKING: Stable/uncomplicated  EVALUATION COMPLEXITY: Low   GOALS: Goals reviewed with patient? Yes  SHORT TERM GOALS: Target date: 02/25/23 Updated 02/18/23  Pt to be I with HEP.  Baseline: Goal status: MET  2.  Pt to demonstrate improved coordination of pelvic floor  and breathing mechanics with body weight squat with appropriate synergistic patterns to decrease pain and leakage at least 50% of the time.    Baseline:  Goal status: MET  3.  Pt to be I with urge drill for decreased urinary incontinence with not making it to bathroom quickly enough.  Baseline:  Goal status: MET  4.  Pt to be I with pressure management to decrease stress at bladder with attempts at holding urine.  Baseline:  Goal status: MET   LONG TERM GOALS: Target date: 03/28/23  Pt to be I with advanced HEP.  Baseline:  Goal status: MET  2.  Pt to demonstrate improved coordination of pelvic floor and breathing mechanics with 10# squat with appropriate synergistic patterns to decrease pain and leakage at least 75% of the time.    Baseline:  Goal status:MET  3.  Pt will have 25% less urgency due to bladder retraining and strengthening  Baseline:  Goal status: MET  4.  Pt with demonstrate full ROM of trunk in all directions for decreased stress at pelvic floor and leakage.  Baseline:  Goal status: MET  5.  Pt to report no more than one instance of urinary incontinence in a week for improved QOL.  Baseline:  Goal status: not met   6.  Pt to demonstrate at least 5/5 bil hip strength for improved pelvic stability and functional squats without leakage.  Baseline:  Goal status: MET  PLAN:  PT FREQUENCY: 1x/week  PT DURATION:  8 sessions  PLANNED INTERVENTIONS: 97110-Therapeutic exercises, 97530- Therapeutic activity, 97112- Neuromuscular re-education, 97535- Self Care, 62952- Manual therapy, Patient/Family education, Taping, Dry Needling, Joint mobilization, Spinal mobilization, Scar mobilization, Cryotherapy, Moist heat, and Biofeedback  PLAN FOR NEXT SESSION:   PHYSICAL THERAPY DISCHARGE SUMMARY  Visits from Start of Care: 5  Current functional level related to goals / functional outcomes: All STG met, 5/6LG met   Remaining deficits: Does still have leakage but  states 85% less   Education / Equipment: HEP, urge drill, bladder irritants    Patient agrees to discharge. Patient goals were partially met. Patient is being discharged due to being pleased with the current functional level.   Otelia Sergeant, PT, DPT 03/26/2509:04 AM

## 2023-03-27 NOTE — Patient Instructions (Signed)

## 2023-04-14 ENCOUNTER — Ambulatory Visit
Admission: RE | Admit: 2023-04-14 | Discharge: 2023-04-14 | Disposition: A | Discharge status: Home / Self Care | Payer: Medicare PPO | Source: Ambulatory Visit | Attending: Internal Medicine | Admitting: Internal Medicine

## 2023-04-14 DIAGNOSIS — Z1231 Encounter for screening mammogram for malignant neoplasm of breast: Secondary | ICD-10-CM

## 2023-04-16 ENCOUNTER — Other Ambulatory Visit: Payer: Self-pay | Admitting: Internal Medicine

## 2023-04-16 DIAGNOSIS — R928 Other abnormal and inconclusive findings on diagnostic imaging of breast: Secondary | ICD-10-CM

## 2023-04-17 ENCOUNTER — Encounter: Payer: Self-pay | Admitting: Nurse Practitioner

## 2023-04-22 ENCOUNTER — Ambulatory Visit
Admission: RE | Admit: 2023-04-22 | Discharge: 2023-04-22 | Disposition: A | Discharge status: Home / Self Care | Source: Ambulatory Visit | Attending: Internal Medicine | Admitting: Internal Medicine

## 2023-04-22 DIAGNOSIS — N6001 Solitary cyst of right breast: Secondary | ICD-10-CM | POA: Diagnosis not present

## 2023-04-22 DIAGNOSIS — N6314 Unspecified lump in the right breast, lower inner quadrant: Secondary | ICD-10-CM | POA: Diagnosis not present

## 2023-04-22 DIAGNOSIS — R928 Other abnormal and inconclusive findings on diagnostic imaging of breast: Secondary | ICD-10-CM

## 2023-05-14 ENCOUNTER — Other Ambulatory Visit: Payer: Self-pay | Admitting: Internal Medicine

## 2023-08-13 DIAGNOSIS — K760 Fatty (change of) liver, not elsewhere classified: Secondary | ICD-10-CM | POA: Diagnosis not present

## 2023-08-13 DIAGNOSIS — E119 Type 2 diabetes mellitus without complications: Secondary | ICD-10-CM | POA: Diagnosis not present

## 2023-08-13 DIAGNOSIS — I10 Essential (primary) hypertension: Secondary | ICD-10-CM | POA: Diagnosis not present

## 2023-08-13 DIAGNOSIS — E042 Nontoxic multinodular goiter: Secondary | ICD-10-CM | POA: Diagnosis not present

## 2023-09-10 DIAGNOSIS — E119 Type 2 diabetes mellitus without complications: Secondary | ICD-10-CM | POA: Diagnosis not present

## 2023-09-10 DIAGNOSIS — Z7984 Long term (current) use of oral hypoglycemic drugs: Secondary | ICD-10-CM | POA: Diagnosis not present

## 2023-09-10 DIAGNOSIS — Z9889 Other specified postprocedural states: Secondary | ICD-10-CM | POA: Diagnosis not present

## 2023-09-10 DIAGNOSIS — H40053 Ocular hypertension, bilateral: Secondary | ICD-10-CM | POA: Diagnosis not present

## 2023-09-10 DIAGNOSIS — Z0100 Encounter for examination of eyes and vision without abnormal findings: Secondary | ICD-10-CM | POA: Diagnosis not present

## 2023-09-10 DIAGNOSIS — H43813 Vitreous degeneration, bilateral: Secondary | ICD-10-CM | POA: Diagnosis not present

## 2023-10-16 ENCOUNTER — Ambulatory Visit (HOSPITAL_BASED_OUTPATIENT_CLINIC_OR_DEPARTMENT_OTHER)
Admission: RE | Admit: 2023-10-16 | Discharge: 2023-10-16 | Disposition: A | Discharge status: Home / Self Care | Source: Ambulatory Visit | Attending: Internal Medicine | Admitting: Internal Medicine

## 2023-10-16 DIAGNOSIS — M8588 Other specified disorders of bone density and structure, other site: Secondary | ICD-10-CM | POA: Diagnosis not present

## 2023-10-16 DIAGNOSIS — Z780 Asymptomatic menopausal state: Secondary | ICD-10-CM | POA: Insufficient documentation

## 2023-10-16 DIAGNOSIS — Z0000 Encounter for general adult medical examination without abnormal findings: Secondary | ICD-10-CM | POA: Insufficient documentation

## 2023-10-16 DIAGNOSIS — M8580 Other specified disorders of bone density and structure, unspecified site: Secondary | ICD-10-CM | POA: Diagnosis not present

## 2023-10-17 ENCOUNTER — Other Ambulatory Visit: Payer: Medicare PPO

## 2023-10-27 ENCOUNTER — Ambulatory Visit: Payer: Self-pay | Admitting: Internal Medicine

## 2023-10-30 LAB — HM DEXA SCAN

## 2023-11-15 ENCOUNTER — Other Ambulatory Visit: Payer: Self-pay | Admitting: Internal Medicine

## 2023-11-17 LAB — MICROALBUMIN / CREATININE URINE RATIO: Microalb Creat Ratio: 3

## 2023-11-27 ENCOUNTER — Other Ambulatory Visit: Payer: Self-pay | Admitting: Internal Medicine

## 2023-12-25 ENCOUNTER — Encounter: Payer: Medicare PPO | Admitting: Internal Medicine

## 2023-12-26 ENCOUNTER — Ambulatory Visit: Admitting: Internal Medicine

## 2023-12-26 ENCOUNTER — Encounter: Payer: Self-pay | Admitting: Internal Medicine

## 2023-12-26 VITALS — BP 120/70 | HR 97 | Temp 98.5°F | Ht 65.0 in | Wt 171.8 lb

## 2023-12-26 DIAGNOSIS — M722 Plantar fascial fibromatosis: Secondary | ICD-10-CM | POA: Insufficient documentation

## 2023-12-26 DIAGNOSIS — E1169 Type 2 diabetes mellitus with other specified complication: Secondary | ICD-10-CM

## 2023-12-26 DIAGNOSIS — I10 Essential (primary) hypertension: Secondary | ICD-10-CM

## 2023-12-26 DIAGNOSIS — E118 Type 2 diabetes mellitus with unspecified complications: Secondary | ICD-10-CM

## 2023-12-26 DIAGNOSIS — E079 Disorder of thyroid, unspecified: Secondary | ICD-10-CM

## 2023-12-26 DIAGNOSIS — Z0000 Encounter for general adult medical examination without abnormal findings: Secondary | ICD-10-CM

## 2023-12-26 DIAGNOSIS — K760 Fatty (change of) liver, not elsewhere classified: Secondary | ICD-10-CM | POA: Insufficient documentation

## 2023-12-26 DIAGNOSIS — M8589 Other specified disorders of bone density and structure, multiple sites: Secondary | ICD-10-CM

## 2023-12-26 LAB — LIPID PANEL
Cholesterol: 127 mg/dL (ref 0–200)
HDL: 52.2 mg/dL (ref >39.00)
LDL Cholesterol: 41 mg/dL (ref 0–99)
NonHDL: 74.49
Total CHOL/HDL Ratio: 2
Triglycerides: 166 mg/dL — ABNORMAL HIGH (ref 0.0–149.0)
VLDL: 33.2 mg/dL (ref 0.0–40.0)

## 2023-12-26 LAB — CBC
HCT: 45.8 % (ref 36.0–46.0)
Hemoglobin: 14.8 g/dL (ref 12.0–15.0)
MCHC: 32.3 g/dL (ref 30.0–36.0)
MCV: 85.3 fl (ref 78.0–100.0)
Platelets: 296 K/uL (ref 150.0–400.0)
RBC: 5.37 Mil/uL — ABNORMAL HIGH (ref 3.87–5.11)
RDW: 14.5 % (ref 11.5–15.5)
WBC: 8.1 K/uL (ref 4.0–10.5)

## 2023-12-26 LAB — COMPREHENSIVE METABOLIC PANEL WITH GFR
ALT: 36 U/L — ABNORMAL HIGH (ref 0–35)
AST: 34 U/L (ref 0–37)
Albumin: 4.5 g/dL (ref 3.5–5.2)
Alkaline Phosphatase: 118 U/L — ABNORMAL HIGH (ref 39–117)
BUN: 8 mg/dL (ref 6–23)
CO2: 26 meq/L (ref 19–32)
Calcium: 10.4 mg/dL (ref 8.4–10.5)
Chloride: 100 meq/L (ref 96–112)
Creatinine, Ser: 0.86 mg/dL (ref 0.40–1.20)
GFR: 66.43 mL/min (ref >60.00)
Glucose, Bld: 145 mg/dL — ABNORMAL HIGH (ref 70–99)
Potassium: 3.8 meq/L (ref 3.5–5.1)
Sodium: 138 meq/L (ref 135–145)
Total Bilirubin: 0.4 mg/dL (ref 0.2–1.2)
Total Protein: 8 g/dL (ref 6.0–8.3)

## 2023-12-26 LAB — POCT GLYCOSYLATED HEMOGLOBIN (HGB A1C): Hemoglobin A1C: 7.5 % — AB (ref 4.0–5.6)

## 2023-12-26 LAB — TSH: TSH: 0.89 u[IU]/mL (ref 0.35–5.50)

## 2023-12-26 NOTE — Assessment & Plan Note (Signed)
 BP at goal on regimen and checking CMP adjust as needed.

## 2023-12-26 NOTE — Progress Notes (Signed)
° °  Subjective:   Patient ID: Anna Cooper, female    DOB: Mar 14, 1950, 73 y.o.   MRN: [id number]  The patient is here for physical. Pertinent topics discussed: Discussed the use of AI scribe software for clinical note transcription with the patient, who gave verbal consent to proceed. History of Present Illness Anna Cooper is a 73 year old female who presents with left foot pain.  She has been experiencing left foot pain for a few days, located under the foot and described as constant, becoming unbearable when standing. There is no history of new or different shoes, and the pain worsens with activity. She has been taking Advil  for relief but is cautious due to her liver condition. The pain has remained stable since onset, with no improvement or worsening.  She has a history of fatty liver disease diagnosed a couple of years ago. Her liver function tests have shown a slightly elevated alkaline phosphatase level, but other liver enzymes have remained normal. She has not experienced any symptoms suggestive of liver dysfunction.  Her A1c level has increased to 7.5 from 6.4.  She notes the presence of a couple of bumps on her arm and leg, which she describes as feeling like a marble.  PMH, [facility], social history reviewed and updated  Review of Systems  Constitutional: Negative.   HENT: Negative.    Eyes: Negative.   Respiratory:  Negative for cough, chest tightness and shortness of breath.   Cardiovascular:  Negative for chest pain, palpitations and leg swelling.  Gastrointestinal:  Negative for abdominal distention, abdominal pain, constipation, diarrhea, nausea and vomiting.  Musculoskeletal:  Positive for arthralgias and myalgias.  Skin: Negative.   Neurological: Negative.   Psychiatric/Behavioral: Negative.      Objective:  Physical Exam Constitutional:      Appearance: She is well-developed.  HENT:     Head: Normocephalic and atraumatic.  Cardiovascular:     Rate and  Rhythm: Normal rate and regular rhythm.  Pulmonary:     Effort: Pulmonary effort is normal. No respiratory distress.     Breath sounds: Normal breath sounds. No wheezing or rales.  Abdominal:     General: Bowel sounds are normal. There is no distension.     Palpations: Abdomen is soft.     Tenderness: There is no abdominal tenderness.  Musculoskeletal:     Cervical back: Normal range of motion.  Skin:    General: Skin is warm and dry.  Neurological:     Mental Status: She is alert and oriented to person, place, and time.     Coordination: Coordination normal.     Vitals:   12/26/23 1029  BP: 120/70  Pulse: 97  Temp: 98.5 F (36.9 C)  TempSrc: Oral  SpO2: 97%  Weight: 171 lb 12.8 oz (77.9 kg)  Height: 5' 5 (1.651 m)    Assessment & Plan:

## 2023-12-26 NOTE — Assessment & Plan Note (Signed)
 Reviewed findings with her and repeat DEXA 2028.

## 2023-12-26 NOTE — Assessment & Plan Note (Signed)
 Checking lipid panel and adjust as needed.

## 2023-12-26 NOTE — Assessment & Plan Note (Signed)
 Checking TSH and reviewed last US  thyroid  without recall need.

## 2023-12-26 NOTE — Patient Instructions (Addendum)
 Your HgA1c today is 7.5%  You can try ibuprofen  for the plantar fasciitis.

## 2023-12-26 NOTE — Assessment & Plan Note (Signed)
 With elevated alk phos and some elevation from 2024 recently. Will recheck US  RUQ and CMP today.

## 2023-12-26 NOTE — Assessment & Plan Note (Signed)
 Flu shot yearly. Pneumonia complete. Shingrix complete. Tetanus due at pharmacy. Colonoscopy up to date. Mammogram up to date, pap smear aged out and dexa due 2028. Counseled about sun safety and mole surveillance. Counseled about the dangers of distracted driving. Given 10 year screening recommendations.

## 2023-12-26 NOTE — Assessment & Plan Note (Signed)
 POC HgA1c done and increased to 7.5. Counseled about diet and exercise changes to help. On statin. Checking UACR, lipid panel and CMP.

## 2023-12-26 NOTE — Assessment & Plan Note (Signed)
 Counseled about stretching exercises and encouraged NSAIDs for 1-2 weeks to help.

## 2023-12-29 ENCOUNTER — Ambulatory Visit: Payer: Self-pay | Admitting: Internal Medicine

## 2024-01-06 ENCOUNTER — Ambulatory Visit
Admission: RE | Admit: 2024-01-06 | Discharge: 2024-01-06 | Disposition: A | Discharge status: Home / Self Care | Source: Ambulatory Visit | Attending: Internal Medicine | Admitting: Internal Medicine

## 2024-01-06 DIAGNOSIS — K760 Fatty (change of) liver, not elsewhere classified: Secondary | ICD-10-CM

## 2024-01-29 ENCOUNTER — Other Ambulatory Visit: Payer: Self-pay | Admitting: Internal Medicine

## 2024-02-18 ENCOUNTER — Other Ambulatory Visit: Payer: Self-pay | Admitting: Internal Medicine

## 2024-02-25 ENCOUNTER — Ambulatory Visit: Payer: Medicare PPO
